# Patient Record
Sex: Female | Born: 1970
Health system: Southern US, Community
[De-identification: ages and names within clinical notes are randomized; demographics above are authoritative.]

## PROBLEM LIST (undated history)

## (undated) DIAGNOSIS — K259 Gastric ulcer, unspecified as acute or chronic, without hemorrhage or perforation: Secondary | ICD-10-CM

## (undated) DIAGNOSIS — D509 Iron deficiency anemia, unspecified: Secondary | ICD-10-CM

## (undated) DIAGNOSIS — D6851 Activated protein C resistance: Secondary | ICD-10-CM

## (undated) DIAGNOSIS — T8859XA Other complications of anesthesia, initial encounter: Secondary | ICD-10-CM

## (undated) DIAGNOSIS — M1990 Unspecified osteoarthritis, unspecified site: Secondary | ICD-10-CM

## (undated) DIAGNOSIS — L509 Urticaria, unspecified: Secondary | ICD-10-CM

## (undated) DIAGNOSIS — R519 Headache, unspecified: Secondary | ICD-10-CM

## (undated) DIAGNOSIS — G43909 Migraine, unspecified, not intractable, without status migrainosus: Secondary | ICD-10-CM

## (undated) DIAGNOSIS — E039 Hypothyroidism, unspecified: Secondary | ICD-10-CM

## (undated) DIAGNOSIS — I82409 Acute embolism and thrombosis of unspecified deep veins of unspecified lower extremity: Secondary | ICD-10-CM

## (undated) DIAGNOSIS — R51 Headache: Secondary | ICD-10-CM

## (undated) DIAGNOSIS — T4145XA Adverse effect of unspecified anesthetic, initial encounter: Secondary | ICD-10-CM

## (undated) HISTORY — PX: DILATION AND CURETTAGE OF UTERUS: SHX78

## (undated) HISTORY — DX: Hypothyroidism, unspecified: E03.9

## (undated) HISTORY — DX: Urticaria, unspecified: L50.9

## (undated) HISTORY — PX: TOOTH EXTRACTION: SHX859

## (undated) HISTORY — PX: GASTRIC BYPASS: SHX52

## (undated) HISTORY — PX: ANKLE FRACTURE SURGERY: SHX122

## (undated) HISTORY — DX: Migraine, unspecified, not intractable, without status migrainosus: G43.909

## (undated) HISTORY — PX: CHOLECYSTECTOMY: SHX55

## (undated) HISTORY — DX: Gastric ulcer, unspecified as acute or chronic, without hemorrhage or perforation: K25.9

## (undated) HISTORY — DX: Acute embolism and thrombosis of unspecified deep veins of unspecified lower extremity: I82.409

## (undated) HISTORY — PX: TUBAL LIGATION: SHX77

## (undated) HISTORY — PX: KNEE ARTHROSCOPY: SUR90

---

## 2014-10-01 ENCOUNTER — Encounter: Payer: Self-pay | Admitting: Family Medicine

## 2014-10-01 ENCOUNTER — Ambulatory Visit (INDEPENDENT_AMBULATORY_CARE_PROVIDER_SITE_OTHER): Payer: 59 | Admitting: Family Medicine

## 2014-10-01 ENCOUNTER — Encounter (INDEPENDENT_AMBULATORY_CARE_PROVIDER_SITE_OTHER): Payer: Self-pay

## 2014-10-01 VITALS — BP 112/74 | HR 84 | Temp 98.6°F | Ht 65.63 in | Wt 231.4 lb

## 2014-10-01 DIAGNOSIS — Z9884 Bariatric surgery status: Secondary | ICD-10-CM

## 2014-10-01 DIAGNOSIS — R252 Cramp and spasm: Secondary | ICD-10-CM | POA: Diagnosis not present

## 2014-10-01 DIAGNOSIS — M25562 Pain in left knee: Secondary | ICD-10-CM | POA: Diagnosis not present

## 2014-10-01 DIAGNOSIS — E039 Hypothyroidism, unspecified: Secondary | ICD-10-CM | POA: Diagnosis not present

## 2014-10-01 DIAGNOSIS — J309 Allergic rhinitis, unspecified: Secondary | ICD-10-CM

## 2014-10-01 LAB — COMPREHENSIVE METABOLIC PANEL
ALK PHOS: 84 U/L (ref 39–117)
ALT: 10 U/L (ref 0–35)
AST: 13 U/L (ref 0–37)
Albumin: 4 g/dL (ref 3.5–5.2)
BILIRUBIN TOTAL: 0.3 mg/dL (ref 0.2–1.2)
BUN: 21 mg/dL (ref 6–23)
CO2: 24 meq/L (ref 19–32)
CREATININE: 0.76 mg/dL (ref 0.40–1.20)
Calcium: 9.1 mg/dL (ref 8.4–10.5)
Chloride: 106 mEq/L (ref 96–112)
GFR: 86.88 mL/min (ref >60.00)
GLUCOSE: 76 mg/dL (ref 70–99)
Potassium: 4.1 mEq/L (ref 3.5–5.1)
Sodium: 137 mEq/L (ref 135–145)
TOTAL PROTEIN: 7.1 g/dL (ref 6.0–8.3)

## 2014-10-01 LAB — CBC
HCT: 30.4 % — ABNORMAL LOW (ref 36.0–46.0)
HEMOGLOBIN: 9.7 g/dL — AB (ref 12.0–15.0)
MCHC: 31.9 g/dL (ref 30.0–36.0)
MCV: 81.8 fl (ref 78.0–100.0)
PLATELETS: 476 10*3/uL — AB (ref 150.0–400.0)
RBC: 3.72 Mil/uL — ABNORMAL LOW (ref 3.87–5.11)
RDW: 16.4 % — AB (ref 11.5–15.5)
WBC: 9.4 10*3/uL (ref 4.0–10.5)

## 2014-10-01 LAB — TSH: TSH: 4.74 u[IU]/mL — AB (ref 0.35–4.50)

## 2014-10-01 LAB — T4, FREE: Free T4: 0.62 ng/dL (ref 0.60–1.60)

## 2014-10-01 MED ORDER — OMEPRAZOLE 40 MG PO CPDR: 40.0000 mg | DELAYED_RELEASE_CAPSULE | Freq: Every day | ORAL | Status: DC

## 2014-10-01 MED ORDER — FLUTICASONE PROPIONATE 50 MCG/ACT NA SUSP: 2.0000 | Freq: Every day | NASAL | Status: DC

## 2014-10-01 MED ORDER — VITAMIN D (ERGOCALCIFEROL) 1.25 MG (50000 UNIT) PO CAPS: 50000.0000 [IU] | ORAL_CAPSULE | ORAL | Status: DC

## 2014-10-01 NOTE — Assessment & Plan Note (Signed)
Normal exam today. Followed by ortho. Is to have partial knee replacement and needs surgical clearance. She is to obtain the surgical clearance form and request records from her prior physician and then follow-up in the office for surgical clearance office visit.

## 2014-10-01 NOTE — Assessment & Plan Note (Signed)
Has had these for a long time. No abnormalities on exam. Suspect these are related to dehydration and possible prior abnormal gait relating to pain in left knee. Will check electrolytes and TSH and CBC. Stay well hydrated. Given return precautions.

## 2014-10-01 NOTE — Progress Notes (Signed)
Pre visit review using our clinic review tool, if applicable. No additional management support is needed unless otherwise documented below in the visit note. 

## 2014-10-01 NOTE — Patient Instructions (Signed)
Nice to meet you. Please start on the flonase for your allergies. We will refer you to surgery for follow-up on your gastric bypass.  We will check labs today. Please obtain the surgical release form and schedule an appointment for this.  If you have abdominal pain, nausea, vomiting, diarrhea, numbness, weakness, fever, or feel poorly please seek medical attention.

## 2014-10-01 NOTE — Assessment & Plan Note (Signed)
Suspect sinus pressure related to this. Is neurologically intact. No neuro symptoms with this. No issues at this time. No symptoms to indicate acute infection. Will start on flonase. Given return precautions.

## 2014-10-01 NOTE — Assessment & Plan Note (Signed)
On armour thyroid. Will check TSH and adjust dose as necessary.

## 2014-10-01 NOTE — Progress Notes (Signed)
Patient ID: Whitney Hill, female   DOB: August 10, 1970, 44 y.o.   MRN: [id number]  Tommi Rumps, MD Phone: [phone]  Whitney Hill is a 44 y.o. female who presents today for new patient visit.  HYPOTHYROIDISM Disease Monitoring Weight changes: mild increase in weight  Skin Changes: dry skin is stable Has history of hair falling out. Previously followed by endocrinology for this issue  Medication Monitoring Compliance:  Taking armour thyroid daily.   Last TSH:  No results found for: TSH   Gastric bypass: notes she had this in 2012. Did well initially, though developed an ulcer in 2013 and had revision. Has had 4-5 EGDs to evaluate this in the past. Last was 2015. No abdominal pain or blood in stool. Notes a sensation of hunger when her ulcer is acting up. No reflux. Takes prilosec daily and this helps. Last had lab work at the end of 2015. Notes weight is stable.  Left knee pain: notes long history of pain in left knee and feet. Has history of meniscal tear and multiple surgeries on her left knee. Microfracture surgery last year. Is due for partial knee replacement. Has had cramps in her feet and legs intermittently for a long time. Has history of hypokalemia. Has plantar fascitis followed by podiatry. History of ankle fracture in right ankle when younger. Has had injects in ankles and for plantar fascia recently per podiatry. Notes no swelling, fever, erythema, or chills.   Sinus pressure: patient notes intermittent frontal and maxillary sinus pressure since moving to [place]. Notes that she has history of allergic rhinitis, though now has no eye symptoms, rhinorrhea, or post nasal drip. No weakness, numbness, or vision changes with this pressure. Notes that this pressure has improved recently. No medications for this.    Active Ambulatory Problems    Diagnosis Date Noted  . Hypothyroidism 10/01/2014  . Left knee pain 10/01/2014  . Cramps, extremity 10/01/2014  . Allergic rhinitis  10/01/2014  . S/P gastric bypass 10/01/2014   Resolved Ambulatory Problems    Diagnosis Date Noted  . No Resolved Ambulatory Problems   Past Medical History  Diagnosis Date  . Gastric ulcer   . DVT (deep venous thrombosis)   . Hypothyroid     Family History  Problem Relation Age of Onset  . Arthritis      parent  . Breast cancer      aunt, grandmother  . Hyperlipidemia      parent  . Stroke      parent  . Hypertension      parent  . Kidney disease      parent, grandparent  . Diabetes      parent, grandparent  . Renal cancer      parent    Social History   Social History  . Marital Status: Married    Spouse Name: N/A  . Number of Children: N/A  . Years of Education: N/A   Occupational History  . Not on file.   Social History Main Topics  . Smoking status: Never Smoker   . Smokeless tobacco: Not on file  . Alcohol Use: No  . Drug Use: No  . Sexual Activity: Not on file   Other Topics Concern  . Not on file   Social History Narrative  . No narrative on file    ROS    General:  Negative for nexplained weight loss, fever Skin: Negative for new or changing mole, sore that won't heal HEENT: positive for sinus pressure,  Negative for trouble hearing, trouble seeing, ringing in ears, mouth sores, hoarseness, change in voice, dysphagia. CV:  Negative for chest pain, dyspnea, edema, palpitations Resp: Negative for cough, dyspnea, hemoptysis GI: Negative for nausea, vomiting, diarrhea, constipation, abdominal pain, melena, hematochezia. GU: Negative for dysuria, incontinence, urinary hesitance, hematuria, vaginal or penile discharge, polyuria, sexual difficulty, lumps in testicle or breasts MSK: positive for joint pain and muscle cramps Neuro: Negative for headaches, weakness, numbness, dizziness, passing out/fainting Psych: Negative for depression, anxiety, memory problems  Objective  Physical Exam Filed Vitals:   10/01/14 1033  BP: 112/74  Pulse: 84   Temp: 98.6 F (37 C)    BP Readings from Last 3 Encounters:  10/01/14 112/74   Wt Readings from Last 3 Encounters:  10/01/14 231 lb 6.4 oz (104.962 kg)    Physical Exam  Constitutional: She is well-developed, well-nourished, and in no distress.  HENT:  Head: Normocephalic and atraumatic.  Right Ear: External ear normal.  Left Ear: External ear normal.  Mouth/Throat: Oropharynx is clear and moist. No oropharyngeal exudate.  Eyes: Conjunctivae are normal. Pupils are equal, round, and reactive to light.  Neck: Neck supple.  Cardiovascular: Normal rate, regular rhythm and normal heart sounds.  Exam reveals no gallop and no friction rub.   No murmur heard. Pulmonary/Chest: Effort normal and breath sounds normal. No respiratory distress. She has no wheezes. She has no rales.  Abdominal: Soft. Bowel sounds are normal. She exhibits no distension. There is no tenderness. There is no rebound and no guarding.  Musculoskeletal: She exhibits no edema.  Bilateral knees with no swelling, erythema, tenderness, or ligamentous laxity, negative mcmurray bilaterally Bilateral feet and ankles with no swelling, erythema, or tenderness LE WWP 2+ DP pulses bilaterally No calf spasm or tenderness, no cords in calves  Lymphadenopathy:    She has no cervical adenopathy.  Neurological: She is alert. Gait normal.  CN 2-12 intact, 5/5 strength in bilateral biceps, triceps, grip, quads, hamstrings, plantar and dorsiflexion, sensation to light touch intact in bilateral UE and LE, normal gait, 2+ patellar reflexes  Skin: Skin is warm and dry. She is not diaphoretic.  Psychiatric: Mood and affect normal.     Assessment/Plan:   Hypothyroidism On armour thyroid. Will check TSH and adjust dose as necessary.   Left knee pain Normal exam today. Followed by ortho. Is to have partial knee replacement and needs surgical clearance. She is to obtain the surgical clearance form and request records from her prior  physician and then follow-up in the office for surgical clearance office visit.   Cramps, extremity Has had these for a long time. No abnormalities on exam. Suspect these are related to dehydration and possible prior abnormal gait relating to pain in left knee. Will check electrolytes and TSH and CBC. Stay well hydrated. Given return precautions.   Allergic rhinitis Suspect sinus pressure related to this. Is neurologically intact. No neuro symptoms with this. No issues at this time. No symptoms to indicate acute infection. Will start on flonase. Given return precautions.   S/P gastric bypass Patient s/p gastric bypass several years ago. Has had revision due to ulcer formation. No abdominal pain at this time. Followed by surgery and GI for this in [place]. Will continue PPI at this time. Will refer to [facility] in [place] to follow up on this. Given return precautions.     Orders Placed This Encounter  Procedures  . TSH  . CBC  . Comp Met (CMET)  .  T3  . T4, free    Meds ordered this encounter  Medications  . DISCONTD: omeprazole (PRILOSEC) 40 MG capsule    Sig: Take 40 mg by mouth daily.  Marland Kitchen thyroid (ARMOUR) 90 MG tablet    Sig: Take 90 mg by mouth daily.  Marland Kitchen DISCONTD: Vitamin D, Ergocalciferol, (DRISDOL) 50000 UNITS CAPS capsule    Sig: Take 50,000 Units by mouth every 7 (seven) days.  . fluticasone (FLONASE) 50 MCG/ACT nasal spray    Sig: Place 2 sprays into both nostrils daily.    Dispense:  16 g    Refill:  6  . omeprazole (PRILOSEC) 40 MG capsule    Sig: Take 1 capsule (40 mg total) by mouth daily.    Dispense:  90 capsule    Refill:  3  . Vitamin D, Ergocalciferol, (DRISDOL) 50000 UNITS CAPS capsule    Sig: Take 1 capsule (50,000 Units total) by mouth every 7 (seven) days.    Dispense:  30 capsule    Refill:  0    Tommi Rumps

## 2014-10-01 NOTE — Assessment & Plan Note (Signed)
Patient s/p gastric bypass several years ago. Has had revision due to ulcer formation. No abdominal pain at this time. Followed by surgery and GI for this in [place]. Will continue PPI at this time. Will refer to [facility] in [place] to follow up on this. Given return precautions.

## 2014-10-02 ENCOUNTER — Telehealth: Payer: Self-pay | Admitting: Family Medicine

## 2014-10-02 LAB — T3: T3, Total: 78.8 ng/dL — ABNORMAL LOW (ref 80.0–204.0)

## 2014-10-02 NOTE — Telephone Encounter (Signed)
Called patient to discuss lab work. Thyroid tests near normal. CMET normal. Is anemic. Patient declines any history of anemia. Notes she was previously on iron supplements with her gastric bypass, though had constipation issues with this in the past and stopped iron. Notes her surgeon was ok with this since her hgb had been normal. She will start on iron supplement. Will pick up FOBT cards on Monday to evaluate for GI bleed given ulcer history. Denies bleeding from any site. Given return precautions.

## 2014-10-05 NOTE — Telephone Encounter (Signed)
Put FOBT cards at the front for her to pick up.

## 2014-11-05 ENCOUNTER — Telehealth: Payer: Self-pay | Admitting: Surgical

## 2014-11-05 NOTE — Telephone Encounter (Signed)
Patient called back and has decided not to have the knee surgery until January 2017

## 2014-11-05 NOTE — Telephone Encounter (Signed)
Called patient to schedule appointment for surgery clearance. LM for patient to call back.

## 2015-01-14 ENCOUNTER — Encounter: Payer: Self-pay | Admitting: Family Medicine

## 2015-01-14 ENCOUNTER — Ambulatory Visit (INDEPENDENT_AMBULATORY_CARE_PROVIDER_SITE_OTHER): Payer: 59 | Admitting: Family Medicine

## 2015-01-14 VITALS — BP 108/64 | HR 69 | Temp 98.3°F | Ht 65.63 in | Wt 242.2 lb

## 2015-01-14 DIAGNOSIS — D509 Iron deficiency anemia, unspecified: Secondary | ICD-10-CM | POA: Insufficient documentation

## 2015-01-14 DIAGNOSIS — Z419 Encounter for procedure for purposes other than remedying health state, unspecified: Secondary | ICD-10-CM | POA: Insufficient documentation

## 2015-01-14 DIAGNOSIS — D649 Anemia, unspecified: Secondary | ICD-10-CM

## 2015-01-14 DIAGNOSIS — E039 Hypothyroidism, unspecified: Secondary | ICD-10-CM

## 2015-01-14 LAB — CBC WITH DIFFERENTIAL/PLATELET
BASOS ABS: 0 10*3/uL (ref 0.0–0.1)
BASOS PCT: 0.4 % (ref 0.0–3.0)
EOS ABS: 0.2 10*3/uL (ref 0.0–0.7)
Eosinophils Relative: 3.4 % (ref 0.0–5.0)
HEMATOCRIT: 30.6 % — AB (ref 36.0–46.0)
Hemoglobin: 9.6 g/dL — ABNORMAL LOW (ref 12.0–15.0)
LYMPHS ABS: 2.6 10*3/uL (ref 0.7–4.0)
Lymphocytes Relative: 38.1 % (ref 12.0–46.0)
MCHC: 31.2 g/dL (ref 30.0–36.0)
MCV: 80.4 fl (ref 78.0–100.0)
Monocytes Absolute: 0.5 10*3/uL (ref 0.1–1.0)
Monocytes Relative: 7.3 % (ref 3.0–12.0)
NEUTROS ABS: 3.5 10*3/uL (ref 1.4–7.7)
NEUTROS PCT: 50.8 % (ref 43.0–77.0)
PLATELETS: 448 10*3/uL — AB (ref 150.0–400.0)
RBC: 3.81 Mil/uL — ABNORMAL LOW (ref 3.87–5.11)
RDW: 16.9 % — AB (ref 11.5–15.5)
WBC: 6.9 10*3/uL (ref 4.0–10.5)

## 2015-01-14 LAB — TSH: TSH: 6.38 u[IU]/mL — ABNORMAL HIGH (ref 0.35–4.50)

## 2015-01-14 LAB — FOLATE: FOLATE: 6.7 ng/mL (ref >5.9)

## 2015-01-14 LAB — T3, FREE: T3, Free: 3.7 pg/mL (ref 2.3–4.2)

## 2015-01-14 LAB — FERRITIN: Ferritin: 3.3 ng/mL — ABNORMAL LOW (ref 10.0–291.0)

## 2015-01-14 LAB — VITAMIN B12: VITAMIN B 12: 310 pg/mL (ref 211–911)

## 2015-01-14 NOTE — Assessment & Plan Note (Signed)
We'll check TSH and T3, T4.

## 2015-01-14 NOTE — Assessment & Plan Note (Signed)
Patient presented today for evaluation for surgical clearance. I will not state that she can have surgery at this time given that she has previously been anemic. We will complete evaluation of this and treat as necessary prior to stating she can have surgery. She is a low risk patient using the gupta perioperative cardiac risk score, 0.11%. She'll need evaluation of and treatment for her anemia prior to obtaining surgery. She additionally reports a history of DVT and she will need anticoagulation prior to surgery and this will be deferred to her surgeon.

## 2015-01-14 NOTE — Patient Instructions (Signed)
Nice to see you. We will recheck lab work for anemia and her thyroid. We will need these results prior to determining if you can proceed with her surgery.

## 2015-01-14 NOTE — Progress Notes (Signed)
Patient ID: Whitney Hill, female   DOB: 1970-03-01, 45 y.o.   MRN: [id number]  Tommi Rumps, MD Phone: [phone]  Whitney Hill is a 45 y.o. female who presents today for follow-up.  Patient presents to discuss surgical clearance for a left knee replacement. She is having this done by orthopedics in [place]. The surgery is not scheduled yet. Patient has a history of anemia. Most recent hemoglobin was 9.7. She denies any blood loss. She was supposed to complete stool cards, though never picked them up. She denies blood in her stool and excessive bleeding with periods. No bleeding in her urine. She denies chest pain, shortness of breath, palpitations, and fatigue. She did have a gastric bypass and had been on iron supplement though stopped this on her own previously. She restarted iron about 3 months ago. She's taking it twice a day. She does note a history of a gastric ulcer that she notes symptoms include feeling hungry, though no abdominal pain or indigestion. Has been taking omeprazole for this. No blood in her stool. Patient additionally has hypothyroidism and is on Armour Thyroid. Patient does not get chest pain or breathlessness when she climbs 2 flights of stairs at a normal speed. She does not have kidney disease. No family history of issues with anesthesia. No history of MI. No history of irregular heartbeat. She's never had a stroke. She's never had an issue with anesthesia before. No issues of seizure or epilepsy. No pain, stiffness, or arthritis of her neck or jaw. Denies angina. No history of liver disease. No history of heart disease. No history of asthma. Does not have diabetes. Does not have bronchitis. Does not smoke. Denies OSA symptoms. Does note a history of DVT after her first knee surgery and had been on Coumadin after that. For about 3 months.  PMH: nonsmoker.   ROS see history of present illness  Objective  Physical Exam Filed Vitals:   01/14/15  1104  BP: 108/64  Pulse: 69  Temp: 98.3 F (36.8 C)    Physical Exam  Constitutional: She is well-developed, well-nourished, and in no distress.  HENT:  Head: Normocephalic and atraumatic.  Mouth/Throat: Oropharynx is clear and moist. No oropharyngeal exudate.  Eyes: Conjunctivae are normal. Pupils are equal, round, and reactive to light.  Neck: Neck supple.  Cardiovascular: Normal rate, regular rhythm and normal heart sounds.  Exam reveals no gallop and no friction rub.   No murmur heard. Pulmonary/Chest: Effort normal and breath sounds normal. No respiratory distress. She has no wheezes. She has no rales.  Abdominal: Soft. Bowel sounds are normal. She exhibits no distension. There is no tenderness. There is no rebound and no guarding.  Lymphadenopathy:    She has no cervical adenopathy.  Neurological: She is alert. Gait normal.  Skin: Skin is warm and dry. She is not diaphoretic.    Assessment/Plan: Please see individual problem list.  Anemia Suspect this is likely related to nutritional deficiency given her gastric bypass history. She is asymptomatic. She does have a history of gastric ulcer, though reports no bleeding in her stool. She'll be given FOBT cards. We will check a CBC and anemia panel today. She is given return precautions.  Hypothyroidism We'll check TSH and T3, T4.  Surgery, elective, encounter for clearance Patient presented today for evaluation for surgical clearance. I will not state that she can have surgery at this time given that she has previously been anemic. We will complete evaluation of this and treat as necessary prior  to stating she can have surgery. She is a low risk patient using the gupta perioperative cardiac risk score, 0.11%. She'll need evaluation of and treatment for her anemia prior to obtaining surgery. She additionally reports a history of DVT and she will need anticoagulation prior to surgery and this will be deferred to her  surgeon.    Orders Placed This Encounter  Procedures  . CBC w/Diff  . Iron and TIBC  . Ferritin  . B12  . Folate  . TSH  . T3, free  . T4     Dragon voice recognition software was used during the dictation process of this note. If any phrases or words seem inappropriate it is likely secondary to the translation process being inefficient.  Tommi Rumps

## 2015-01-14 NOTE — Progress Notes (Signed)
Pre visit review using our clinic review tool, if applicable. No additional management support is needed unless otherwise documented below in the visit note. 

## 2015-01-14 NOTE — Assessment & Plan Note (Signed)
Suspect this is likely related to nutritional deficiency given her gastric bypass history. She is asymptomatic. She does have a history of gastric ulcer, though reports no bleeding in her stool. She'll be given FOBT cards. We will check a CBC and anemia panel today. She is given return precautions.

## 2015-01-15 LAB — IRON AND TIBC
%SAT: 5 % — ABNORMAL LOW (ref 11–50)
IRON: 24 ug/dL — AB (ref 40–190)
TIBC: 502 ug/dL — AB (ref 250–450)
UIBC: 478 ug/dL — AB (ref 125–400)

## 2015-01-15 LAB — T4: T4, Total: 6.3 ug/dL (ref 4.5–12.0)

## 2015-01-19 ENCOUNTER — Telehealth: Payer: Self-pay | Admitting: *Deleted

## 2015-01-19 DIAGNOSIS — D509 Iron deficiency anemia, unspecified: Secondary | ICD-10-CM

## 2015-01-19 NOTE — Telephone Encounter (Signed)
Patient was seen by Dr.Sonnenberg on Thursday for a medical clearance for surgery . She questioned if she would be cleared for her knee surgery. Please advise Contact [phone]

## 2015-01-19 NOTE — Telephone Encounter (Signed)
I sent her a Pharmacist, community message. I can not clear her for surgery. Her hemoglobin was unchanged from previous. This needs to improve prior to surgery. She needs to complete the stool cards. I also need to determine the best way for her to have her iron repleted. She also needs to get back in to see a bariatric surgeon for follow up of her gastric bypass as this is likely contributing to her iron deficiency.

## 2015-01-19 NOTE — Telephone Encounter (Signed)
Please advise 

## 2015-01-19 NOTE — Telephone Encounter (Signed)
Spoke with patient and advised that Dr. Caryl Bis will not be able to clear for surgery at this point. She stated that she will bring in stool cards tomorrow. Also told her that she will need to follow up with Bariatric surgeon from gastric bypass due to this is likely contributing to the iron deficiency. Patient wants to know if she needs iron infusion. I told her that we will call her back once you have decided next step.

## 2015-01-19 NOTE — Telephone Encounter (Signed)
Left message for patient to return call.

## 2015-01-21 NOTE — Telephone Encounter (Signed)
The most efficient way to get the patient an iron infusion and evaluation of her anemia would be to have her see hematology. I will place referral for this.

## 2015-01-22 NOTE — Telephone Encounter (Signed)
Spoke with patient and gave message from Dr. Caryl Bis. I told patient that referral coordinator will call with appointment for Hematology.

## 2015-01-22 NOTE — Telephone Encounter (Signed)
Do you want patient to call for appointment or would you like to make appointment.

## 2015-01-22 NOTE — Telephone Encounter (Signed)
Left message for patient to return call.

## 2015-01-26 ENCOUNTER — Other Ambulatory Visit: Payer: 59

## 2015-01-29 ENCOUNTER — Inpatient Hospital Stay: Payer: 59 | Attending: Internal Medicine | Admitting: Internal Medicine

## 2015-01-29 ENCOUNTER — Other Ambulatory Visit (INDEPENDENT_AMBULATORY_CARE_PROVIDER_SITE_OTHER): Payer: 59

## 2015-01-29 ENCOUNTER — Encounter: Payer: Self-pay | Admitting: Internal Medicine

## 2015-01-29 ENCOUNTER — Other Ambulatory Visit: Payer: Self-pay | Admitting: *Deleted

## 2015-01-29 ENCOUNTER — Inpatient Hospital Stay: Payer: 59

## 2015-01-29 VITALS — BP 119/80 | HR 80 | Temp 97.3°F | Resp 18 | Ht 65.0 in | Wt 242.3 lb

## 2015-01-29 DIAGNOSIS — Z23 Encounter for immunization: Secondary | ICD-10-CM | POA: Insufficient documentation

## 2015-01-29 DIAGNOSIS — Z79899 Other long term (current) drug therapy: Secondary | ICD-10-CM | POA: Diagnosis not present

## 2015-01-29 DIAGNOSIS — D509 Iron deficiency anemia, unspecified: Secondary | ICD-10-CM | POA: Diagnosis present

## 2015-01-29 DIAGNOSIS — D473 Essential (hemorrhagic) thrombocythemia: Secondary | ICD-10-CM | POA: Insufficient documentation

## 2015-01-29 DIAGNOSIS — Z1211 Encounter for screening for malignant neoplasm of colon: Secondary | ICD-10-CM | POA: Diagnosis not present

## 2015-01-29 DIAGNOSIS — E039 Hypothyroidism, unspecified: Secondary | ICD-10-CM | POA: Insufficient documentation

## 2015-01-29 DIAGNOSIS — Z9884 Bariatric surgery status: Secondary | ICD-10-CM | POA: Insufficient documentation

## 2015-01-29 DIAGNOSIS — Z86718 Personal history of other venous thrombosis and embolism: Secondary | ICD-10-CM | POA: Diagnosis not present

## 2015-01-29 LAB — FECAL OCCULT BLOOD, IMMUNOCHEMICAL: Fecal Occult Bld: NEGATIVE

## 2015-01-29 MED ORDER — SODIUM CHLORIDE 0.9 % IJ SOLN
10.0000 mL | INTRAMUSCULAR | Status: DC | PRN
  Filled 2015-01-29: qty 10

## 2015-01-29 MED ORDER — SODIUM CHLORIDE 0.9 % IV SOLN
INTRAVENOUS | Status: DC
  Administered 2015-01-29: 10:00:00 via INTRAVENOUS
  Filled 2015-01-29: qty 1000

## 2015-01-29 MED ORDER — INFLUENZA VAC SPLIT QUAD 0.5 ML IM SUSY
0.5000 mL | PREFILLED_SYRINGE | Freq: Once | INTRAMUSCULAR | Status: AC
  Administered 2015-01-29: 0.5 mL via INTRAMUSCULAR

## 2015-01-29 MED ORDER — SODIUM CHLORIDE 0.9 % IV SOLN
510.0000 mg | Freq: Once | INTRAVENOUS | Status: AC
  Administered 2015-01-29: 510 mg via INTRAVENOUS
  Filled 2015-01-29: qty 17

## 2015-01-29 NOTE — Progress Notes (Signed)
[place] @ [facility] [phone]  [phone]     Whitney Hill OB: 29-Oct-1970  MR#: [id number]  AOZ#:[id number]  Patient Care Team: Leone Haven, MD as PCP - General (Family Medicine)  CHIEF COMPLAINT:  Chief Complaint  Patient presents with  . IDA     No history exists.    No flowsheet data found.  HISTORY OF PRESENT ILLNESS:   Whitney Hill is a 45 year old Caucasian female with history of morbid obesity, status post gastric bypass surgery in 2012, who is referred to our clinic for evaluation of iron deficiency anemia. Patient claims that until she moved to [place] one year ago, although her hematologic parameters were normal even after gastric bypass. She, however, was found to have anemia and iron deficiency, and was advised to take oral iron supplementation, which she didn't, since oral iron causes significant constipation. She still has menstrual bleedings, which recently have been irregular, lasting up to 4 days. She denies any bleeding from any other source. She also does not have any shortness of breath, chest pain, dizziness, fatigue, feeling cold, nausea, vomiting, diarrhea, constipation. She maintains regular diet.  REVIEW OF SYSTEMS:   Review of Systems  All other systems reviewed and are negative.    PAST MEDICAL HISTORY: Past Medical History  Diagnosis Date  . Gastric ulcer   . DVT (deep venous thrombosis) ([place])     following first knee surgery in 2011  . Hypothyroid     PAST SURGICAL HISTORY: Past Surgical History  Procedure Laterality Date  . Gastric bypass      2012, revision 2013  . Cholecystectomy      1998  . Ankle fracture surgery      1994, 1996  . Dilation and curettage of uterus      1994, 2001  . Tubal ligation      2004  . Tooth extraction      2010  . Knee arthroscopy      2011, 2013, 2015    FAMILY HISTORY Family History  Problem Relation Age of Onset  . Arthritis      parent  . Breast cancer       aunt, grandmother  . Hyperlipidemia      parent  . Stroke      parent  . Hypertension      parent  . Kidney disease      parent, grandparent  . Diabetes      parent, grandparent  . Renal cancer      parent   Patient was tested for BRCA mutation, and was found to be negative.  ADVANCED DIRECTIVES:  No flowsheet data found.  HEALTH MAINTENANCE: Social History  Substance Use Topics  . Smoking status: Never Smoker   . Smokeless tobacco: Not on file  . Alcohol Use: No     Allergies  Allergen Reactions  . Paxil [Paroxetine Hcl] Anaphylaxis    Throat swelled shut  . Penicillins Hives    Current Outpatient Prescriptions  Medication Sig Dispense Refill  . fluticasone (FLONASE) 50 MCG/ACT nasal spray Place 2 sprays into both nostrils daily. 16 g 6  . omeprazole (PRILOSEC) 40 MG capsule Take 1 capsule (40 mg total) by mouth daily. 90 capsule 3  . thyroid (ARMOUR) 90 MG tablet Take 90 mg by mouth daily.    . Vitamin D, Ergocalciferol, (DRISDOL) 50000 UNITS CAPS capsule Take 1 capsule (50,000 Units total) by mouth every 7 (seven) days. 30 capsule 0   No  current facility-administered medications for this visit.    OBJECTIVE:  Filed Vitals:   01/29/15 0905  BP: 76/50  Pulse: 80  Temp: 97.3 F (36.3 C)  Resp: 18     Body mass index is 40.32 kg/(m^2).    ECOG FS:0 - Asymptomatic  Physical Exam  Constitutional: She is oriented to person, place, and time and well-developed, well-nourished, and in no distress. No distress.  Morbidly obese Caucasian female. Body mass index is 40.32 kg/(m^2).   HENT:  Head: Normocephalic and atraumatic.  Right Ear: External ear normal.  Left Ear: External ear normal.  Mouth/Throat: Oropharynx is clear and moist.  Eyes: Conjunctivae are normal. Pupils are equal, round, and reactive to light. Right eye exhibits no discharge. Left eye exhibits no discharge. No scleral icterus.  Neck: Normal range of motion. Neck supple. No JVD present.  No tracheal deviation present. No thyromegaly present.  Cardiovascular: Normal rate, regular rhythm, normal heart sounds and intact distal pulses.  Exam reveals no gallop and no friction rub.   No murmur heard. Pulmonary/Chest: Effort normal and breath sounds normal. No stridor. No respiratory distress. She has no wheezes. She has no rales. She exhibits no tenderness.  Abdominal: Soft. Bowel sounds are normal. She exhibits no distension and no mass. There is no tenderness. There is no rebound and no guarding.  Genitourinary:  Postponed  Musculoskeletal: Normal range of motion. She exhibits no edema or tenderness.  Lymphadenopathy:    She has no cervical adenopathy.  Neurological: She is alert and oriented to person, place, and time. She has normal reflexes. No cranial nerve deficit. She exhibits normal muscle tone. Gait normal. Coordination normal. GCS score is 15.  Skin: Skin is warm. No rash noted. She is not diaphoretic. No erythema. No pallor.  Psychiatric: Mood, memory, affect and judgment normal.  Nursing note and vitals reviewed.    LAB RESULTS:  CBC Latest Ref Rng 01/14/2015 10/01/2014  WBC 4.0 - 10.5 K/uL 6.9 9.4  Hemoglobin 12.0 - 15.0 g/dL 9.6(L) 9.7(L)  Hematocrit 36.0 - 46.0 % 30.6(L) 30.4(L)  Platelets 150.0 - 400.0 K/uL 448.0(H) 476.0(H)    No visits with results within 5 Day(s) from this visit. Latest known visit with results is:  Office Visit on 01/14/2015  Component Date Value Ref Range Status  . WBC 01/14/2015 6.9  4.0 - 10.5 K/uL Final  . RBC 01/14/2015 3.81* 3.87 - 5.11 Mil/uL Final  . Hemoglobin 01/14/2015 9.6* 12.0 - 15.0 g/dL Final  . HCT 01/14/2015 30.6* 36.0 - 46.0 % Final  . MCV 01/14/2015 80.4  78.0 - 100.0 fl Final  . MCHC 01/14/2015 31.2  30.0 - 36.0 g/dL Final  . RDW 01/14/2015 16.9* 11.5 - 15.5 % Final  . Platelets 01/14/2015 448.0* 150.0 - 400.0 K/uL Final  . Neutrophils Relative % 01/14/2015 50.8  43.0 - 77.0 % Final  . Lymphocytes Relative  01/14/2015 38.1  12.0 - 46.0 % Final  . Monocytes Relative 01/14/2015 7.3  3.0 - 12.0 % Final  . Eosinophils Relative 01/14/2015 3.4  0.0 - 5.0 % Final  . Basophils Relative 01/14/2015 0.4  0.0 - 3.0 % Final  . Neutro Abs 01/14/2015 3.5  1.4 - 7.7 K/uL Final  . Lymphs Abs 01/14/2015 2.6  0.7 - 4.0 K/uL Final  . Monocytes Absolute 01/14/2015 0.5  0.1 - 1.0 K/uL Final  . Eosinophils Absolute 01/14/2015 0.2  0.0 - 0.7 K/uL Final  . Basophils Absolute 01/14/2015 0.0  0.0 - 0.1 K/uL Final  .  Iron 01/14/2015 24* 40 - 190 ug/dL Final  . UIBC 01/14/2015 478* 125 - 400 ug/dL Final  . TIBC 01/14/2015 502* 250 - 450 ug/dL Final  . %SAT 01/14/2015 5* 11 - 50 % Final  . Ferritin 01/14/2015 3.3* 10.0 - 291.0 ng/mL Final  . Vitamin B-12 01/14/2015 310  211 - 911 pg/mL Final  . Folate 01/14/2015 6.7  >5.9 ng/mL Final  . TSH 01/14/2015 6.38* 0.35 - 4.50 uIU/mL Final  . T3, Free 01/14/2015 3.7  2.3 - 4.2 pg/mL Final  . T4, Total 01/14/2015 6.3  4.5 - 12.0 ug/dL Final     STUDIES: No results found.  ASSESSMENT and MEDICAL DECISION MAKING:  #Iron deficiency anemia-secondary to inability to absorb iron post gastric bypass surgery. There is no reason for the patient to continue with oral iron supplementation, we will administer intravenous Feraheme today and in one week. She will return to our clinic in 1 month later to have ferritin and CBC checked. The patient will require periodic Feraheme infusions, so we will need to establish the rate of filtration of iron stores by taking ferritin every 3 months for the next year.  The patient has history of gastric ulcer, so blood loss from the GI tract is a possibility. She had FOBT done at her primary care doctor's office last week, but she is not aware of the results. She will follow up on this with the primary care doctor. Vitamin B12 levels and folate levels appear to be reasonably good, but she continues to take oral supplementation, which may or may not be  effective in this situation either. So, it makes sense to check vitamin B12 levels in 6 months to decide on whether the patient would need parenteral injections of vitamin B12. #Thrombocytosis-white likely secondary to iron deficiency. We should monitor her platelet count after normalization of iron stores, and decide on whether the patient needs additional evaluation for essential thrombocythemia once iron stores are normalized. #Hypothyroidism-patient is suboptimally controlled, with TSH being above upper limits of normal. We will refer her to our endocrinology colleagues for additional interventions.  Patient expressed understanding and was in agreement with this plan. She also understands that She can call clinic at any time with any questions, concerns, or complaints.    No matching staging information was found for the patient.  Roxana Hires, MD   01/29/2015 9:04 AM

## 2015-02-05 ENCOUNTER — Inpatient Hospital Stay: Payer: 59

## 2015-02-05 VITALS — BP 105/72 | HR 80 | Resp 20

## 2015-02-05 DIAGNOSIS — D509 Iron deficiency anemia, unspecified: Secondary | ICD-10-CM | POA: Diagnosis not present

## 2015-02-05 MED ORDER — SODIUM CHLORIDE 0.9 % IV SOLN
INTRAVENOUS | Status: DC
  Administered 2015-02-05: 10:00:00 via INTRAVENOUS
  Filled 2015-02-05: qty 1000

## 2015-02-05 MED ORDER — FERUMOXYTOL INJECTION 510 MG/17 ML
510.0000 mg | Freq: Once | INTRAVENOUS | Status: AC
  Administered 2015-02-05: 510 mg via INTRAVENOUS
  Filled 2015-02-05: qty 17

## 2015-02-05 MED ORDER — SODIUM CHLORIDE 0.9 % IJ SOLN
10.0000 mL | INTRAMUSCULAR | Status: DC | PRN
  Filled 2015-02-05: qty 10

## 2015-03-03 ENCOUNTER — Inpatient Hospital Stay: Payer: 59 | Attending: Internal Medicine

## 2015-03-03 DIAGNOSIS — E039 Hypothyroidism, unspecified: Secondary | ICD-10-CM | POA: Diagnosis not present

## 2015-03-03 DIAGNOSIS — Z79899 Other long term (current) drug therapy: Secondary | ICD-10-CM | POA: Diagnosis not present

## 2015-03-03 DIAGNOSIS — Z8719 Personal history of other diseases of the digestive system: Secondary | ICD-10-CM | POA: Diagnosis not present

## 2015-03-03 DIAGNOSIS — Z9884 Bariatric surgery status: Secondary | ICD-10-CM | POA: Insufficient documentation

## 2015-03-03 DIAGNOSIS — Z86718 Personal history of other venous thrombosis and embolism: Secondary | ICD-10-CM | POA: Insufficient documentation

## 2015-03-03 DIAGNOSIS — D509 Iron deficiency anemia, unspecified: Secondary | ICD-10-CM | POA: Diagnosis not present

## 2015-03-03 DIAGNOSIS — D6851 Activated protein C resistance: Secondary | ICD-10-CM | POA: Diagnosis not present

## 2015-03-03 LAB — CBC WITH DIFFERENTIAL/PLATELET
BASOS ABS: 0 10*3/uL (ref 0–0.1)
Basophils Relative: 0 %
EOS PCT: 2 %
Eosinophils Absolute: 0.2 10*3/uL (ref 0–0.7)
HEMATOCRIT: 37.6 % (ref 35.0–47.0)
Hemoglobin: 12.5 g/dL (ref 12.0–16.0)
LYMPHS ABS: 2.2 10*3/uL (ref 1.0–3.6)
LYMPHS PCT: 27 %
MCH: 28.9 pg (ref 26.0–34.0)
MCHC: 33.1 g/dL (ref 32.0–36.0)
MCV: 87.2 fL (ref 80.0–100.0)
MONO ABS: 0.6 10*3/uL (ref 0.2–0.9)
Monocytes Relative: 8 %
NEUTROS ABS: 5.2 10*3/uL (ref 1.4–6.5)
Neutrophils Relative %: 63 %
PLATELETS: 296 10*3/uL (ref 150–440)
RBC: 4.32 MIL/uL (ref 3.80–5.20)
RDW: 24.4 % — ABNORMAL HIGH (ref 11.5–14.5)
WBC: 8.3 10*3/uL (ref 3.6–11.0)

## 2015-03-03 LAB — FERRITIN: Ferritin: 85 ng/mL (ref 11–307)

## 2015-03-04 ENCOUNTER — Other Ambulatory Visit: Payer: 59

## 2015-03-05 ENCOUNTER — Inpatient Hospital Stay: Payer: 59

## 2015-03-05 ENCOUNTER — Inpatient Hospital Stay (HOSPITAL_BASED_OUTPATIENT_CLINIC_OR_DEPARTMENT_OTHER): Payer: 59 | Admitting: Internal Medicine

## 2015-03-05 ENCOUNTER — Other Ambulatory Visit: Payer: Self-pay | Admitting: Internal Medicine

## 2015-03-05 VITALS — BP 107/74 | HR 66

## 2015-03-05 VITALS — BP 110/82 | HR 78 | Temp 97.9°F | Resp 18 | Wt 241.4 lb

## 2015-03-05 DIAGNOSIS — Z9884 Bariatric surgery status: Secondary | ICD-10-CM | POA: Diagnosis not present

## 2015-03-05 DIAGNOSIS — D509 Iron deficiency anemia, unspecified: Secondary | ICD-10-CM | POA: Diagnosis not present

## 2015-03-05 DIAGNOSIS — Z79899 Other long term (current) drug therapy: Secondary | ICD-10-CM

## 2015-03-05 DIAGNOSIS — I82402 Acute embolism and thrombosis of unspecified deep veins of left lower extremity: Secondary | ICD-10-CM

## 2015-03-05 DIAGNOSIS — D6851 Activated protein C resistance: Secondary | ICD-10-CM

## 2015-03-05 MED ORDER — SODIUM CHLORIDE 0.9 % IJ SOLN
10.0000 mL | INTRAMUSCULAR | Status: DC | PRN
  Administered 2015-03-05: 10 mL
  Filled 2015-03-05: qty 10

## 2015-03-05 MED ORDER — SODIUM CHLORIDE 0.9 % IV SOLN
510.0000 mg | Freq: Once | INTRAVENOUS | Status: AC
  Administered 2015-03-05: 510 mg via INTRAVENOUS
  Filled 2015-03-05: qty 17

## 2015-03-05 NOTE — Progress Notes (Signed)
Patient does not feel any improvement in fatigue after iron infusions.  She also would like to discuss possible further work up for Factor IV.  Since last visit here her mother has been tested positive for Factor IV and the patient does have history of DVT in 2011.

## 2015-03-05 NOTE — Progress Notes (Signed)
[facility] OFFICE PROGRESS NOTE  Patient Care Team: Leone Haven, MD as PCP - General (Family Medicine)   SUMMARY OF HEMATOLOGIC HISTORY:  # Iron deficiency Anemia [sec to gastric bypass]; FEB 2017- IV ferrahem x4   # Hx DVT of LLE [s/p s/p Surgery- 2011- 3 months anti-coag]; # Hx of factor V leiden [mom]  # 2012- Gastric by pass-   INTERVAL HISTORY:  Patient is here for follow-up of her iron deficiency anemia.  Patient had 2 IV Feraheme  So far.  She notices some improvement in her fatigue but not completely resolved.   patient states that she is undergoing an upper endoscopy to rule out a gastric ulcer.    Patient states that her mother was recently diagnosed with factor V Leiden;  And she also had a prior history of left lower extremity DVT after Left knee surgery. She is currently not on any anti-coagulation.    REVIEW OF SYSTEMS:  A complete 10 point review of system is done which is negative except mentioned above/history of present illness.   PAST MEDICAL HISTORY :  Past Medical History  Diagnosis Date  . Gastric ulcer   . DVT (deep venous thrombosis) ([place])     following first knee surgery in 2011  . Hypothyroid     PAST SURGICAL HISTORY :   Past Surgical History  Procedure Laterality Date  . Gastric bypass      2012, revision 2013  . Cholecystectomy      1998  . Ankle fracture surgery      1994, 1996  . Dilation and curettage of uterus      1994, 2001  . Tubal ligation      2004  . Tooth extraction      2010  . Knee arthroscopy      2011, 2013, 2015    to rule out gastric ulcer. FAMILY HISTORY :   Family History  Problem Relation Age of Onset  . Arthritis      parent  . Breast cancer      aunt, grandmother  . Hyperlipidemia      parent  . Stroke      parent  . Hypertension      parent  . Kidney disease      parent, grandparent  . Diabetes      parent, grandparent  . Renal cancer      parent    SOCIAL HISTORY:    Social History  Substance Use Topics  . Smoking status: Never Smoker   . Smokeless tobacco: Not on file  . Alcohol Use: No    ALLERGIES:  is allergic to paxil and penicillins.  MEDICATIONS:  Current Outpatient Prescriptions  Medication Sig Dispense Refill  . omeprazole (PRILOSEC) 40 MG capsule Take 1 capsule (40 mg total) by mouth daily. 90 capsule 3  . thyroid (ARMOUR) 90 MG tablet Take 90 mg by mouth daily.    . Vitamin D, Ergocalciferol, (DRISDOL) 50000 UNITS CAPS capsule Take 1 capsule (50,000 Units total) by mouth every 7 (seven) days. 30 capsule 0   No current facility-administered medications for this visit.    PHYSICAL EXAMINATION:   BP 110/82 mmHg  Pulse 78  Temp(Src) 97.9 F (36.6 C)  Resp 18  Wt 241 lb 6.5 oz (109.5 kg)  Filed Weights   03/05/15 1023  Weight: 241 lb 6.5 oz (109.5 kg)    GENERAL: Well-nourished well-developed; Alert, no distress and comfortable.   Alone.  EYES: no pallor or icterus OROPHARYNX: no thrush or ulceration; good dentition  NECK: supple, no masses felt LYMPH:  no palpable lymphadenopathy in the cervical, axillary or inguinal regions LUNGS: clear to auscultation and  No wheeze or crackles HEART/CVS: regular rate & rhythm and no murmurs; No lower extremity edema ABDOMEN:abdomen soft, non-tender and normal bowel sounds Musculoskeletal:no cyanosis of digits and no clubbing  PSYCH: alert & oriented x 3 with fluent speech NEURO: no focal motor/sensory deficits SKIN:  no rashes or significant lesions  LABORATORY DATA:  I have reviewed the data as listed    Component Value Date/Time   NA 137 10/01/2014 1132   K 4.1 10/01/2014 1132   CL 106 10/01/2014 1132   CO2 24 10/01/2014 1132   GLUCOSE 76 10/01/2014 1132   BUN 21 10/01/2014 1132   CREATININE 0.76 10/01/2014 1132   CALCIUM 9.1 10/01/2014 1132   PROT 7.1 10/01/2014 1132   ALBUMIN 4.0 10/01/2014 1132   AST 13 10/01/2014 1132   ALT 10 10/01/2014 1132   ALKPHOS 84 10/01/2014  1132   BILITOT 0.3 10/01/2014 1132    No results found for: SPEP, UPEP  Lab Results  Component Value Date   WBC 8.3 03/03/2015   NEUTROABS 5.2 03/03/2015   HGB 12.5 03/03/2015   HCT 37.6 03/03/2015   MCV 87.2 03/03/2015   PLT 296 03/03/2015      Chemistry      Component Value Date/Time   NA 137 10/01/2014 1132   K 4.1 10/01/2014 1132   CL 106 10/01/2014 1132   CO2 24 10/01/2014 1132   BUN 21 10/01/2014 1132   CREATININE 0.76 10/01/2014 1132      Component Value Date/Time   CALCIUM 9.1 10/01/2014 1132   ALKPHOS 84 10/01/2014 1132   AST 13 10/01/2014 1132   ALT 10 10/01/2014 1132   BILITOT 0.3 10/01/2014 1132       RADIOGRAPHIC STUDIES: I have personally reviewed the radiological images as listed and agreed with the findings in the report. No results found.   ASSESSMENT & PLAN:   # IRON DEFICIENCY ANEMIA sec to gastric bypass-  Patient's hemoglobin is up status post 2 IV iron infusions;  Today's 12.  I recommend 2 more  IV iron infusions.   #  Also discussed that she'll likely need maintenance IV iron infusions moving on.  She'll follow-up with [place] in approximately 4 months/ possible IV iron;  And labs  One week prior.  # Hx of factor V Leiden  mother/Hx of LLE DVT-  Check factor V Leiden.  Counselled regarding genetic testing.   # 15 minutes face-to-face with the patient discussing the above plan of care; more than 50% of time spent on natural history; counseling and coordination.     Cammie Sickle, MD 03/05/2015 10:36 AM

## 2015-03-11 LAB — FACTOR 5 LEIDEN

## 2015-03-12 ENCOUNTER — Inpatient Hospital Stay: Payer: Managed Care, Other (non HMO) | Attending: Internal Medicine

## 2015-03-12 ENCOUNTER — Other Ambulatory Visit: Payer: Self-pay | Admitting: Internal Medicine

## 2015-03-12 ENCOUNTER — Telehealth: Payer: Self-pay | Admitting: Internal Medicine

## 2015-03-12 VITALS — BP 107/73 | HR 68 | Resp 20

## 2015-03-12 DIAGNOSIS — D6851 Activated protein C resistance: Secondary | ICD-10-CM | POA: Insufficient documentation

## 2015-03-12 DIAGNOSIS — Z79899 Other long term (current) drug therapy: Secondary | ICD-10-CM | POA: Insufficient documentation

## 2015-03-12 DIAGNOSIS — D509 Iron deficiency anemia, unspecified: Secondary | ICD-10-CM

## 2015-03-12 DIAGNOSIS — Z9884 Bariatric surgery status: Secondary | ICD-10-CM | POA: Diagnosis not present

## 2015-03-12 MED ORDER — SODIUM CHLORIDE 0.9 % IV SOLN
INTRAVENOUS | Status: DC
  Administered 2015-03-12: 14:00:00 via INTRAVENOUS
  Filled 2015-03-12: qty 1000

## 2015-03-12 MED ORDER — SODIUM CHLORIDE 0.9 % IJ SOLN
10.0000 mL | INTRAMUSCULAR | Status: DC | PRN
  Filled 2015-03-12: qty 10

## 2015-03-12 MED ORDER — FERUMOXYTOL INJECTION 510 MG/17 ML
510.0000 mg | Freq: Once | INTRAVENOUS | Status: AC
  Administered 2015-03-12: 510 mg via INTRAVENOUS
  Filled 2015-03-12: qty 17

## 2015-03-12 NOTE — Telephone Encounter (Signed)
Left message for the patient to call me back regarding the results of her factor V Leiden-heterozygosity

## 2015-03-12 NOTE — Telephone Encounter (Signed)
I spoke to patient- Re: Factor V Leiden heterozygosity; recommend- prophylactic anticoagulation after her orthopedic surgery; BUT NO long term anti-coagulation.. She is asked to talk to [place]- if needed prior to surgery.Whitney Hill

## 2015-04-09 ENCOUNTER — Ambulatory Visit (INDEPENDENT_AMBULATORY_CARE_PROVIDER_SITE_OTHER): Payer: Managed Care, Other (non HMO) | Admitting: Family Medicine

## 2015-04-09 VITALS — BP 114/70 | HR 78 | Temp 98.6°F | Ht 65.63 in | Wt 244.0 lb

## 2015-04-09 DIAGNOSIS — Z01818 Encounter for other preprocedural examination: Secondary | ICD-10-CM | POA: Diagnosis not present

## 2015-04-09 NOTE — Patient Instructions (Signed)
Nice to see you. We will finish your surgical paper work and send it to Engineer, production.

## 2015-04-09 NOTE — Progress Notes (Signed)
Pre visit review using our clinic review tool, if applicable. No additional management support is needed unless otherwise documented below in the visit note. 

## 2015-04-12 DIAGNOSIS — Z01818 Encounter for other preprocedural examination: Secondary | ICD-10-CM | POA: Insufficient documentation

## 2015-04-12 NOTE — Progress Notes (Signed)
Patient ID: Whitney Hill, female   DOB: 09/03/1970, 45 y.o.   MRN: [id number]  Tommi Rumps, MD Phone: [phone]  Whitney Hill is a 45 y.o. female who presents today for follow-up.  Patient presents for presurgical evaluation for knee surgery. She also notes she is considering gastric bypass surgery and has seen a bariatric surgeon in [place] for this. They're considering possible revision. They think she meets the criteria for this. Consider the duodenal switch.  Of note previously patient was found to be anemic. She has followed up with hematology and had 4 iron transfusions. Her hemoglobin most recently is up to 12.5. They also found that she has a factor V Leiden mutation.  Patient notes no chest pain or breathlessness when climbing up several flights of stairs. She is able to do heavy work around the house with no issues. She has no history of kidney disease. No one in her family has had an issue with anesthetic. She's never had a heart attack. She's never been diagnosed with an irregular heartbeat. She's never had a stroke. She's never had any problems with anesthesia previously. No history of epilepsy or seizures. No issues with pain, stiffness or arthritis in her jaw or neck. She does have a history of hypothyroidism though this is well controlled. She does not suffer from angina. No history of liver disease. No history of heart failure. No history of asthma. She does not have diabetes. No history of bronchitis. No sleep apnea symptoms. She does not smoke. She denies chest pain, shortness of breath, syncope, palpitations, history of hypertension, and history of stroke. Of note she does have a history of DVT following last surgery. She was on Coumadin for 3 months following this.   ROS see history of present illness  Objective  Physical Exam Filed Vitals:   04/09/15 1055  BP: 114/70  Pulse: 78  Temp: 98.6 F (37 C)    BP Readings from Last 3 Encounters:    04/09/15 114/70  03/12/15 107/73  03/05/15 107/74   Wt Readings from Last 3 Encounters:  04/09/15 244 lb (110.678 kg)  03/05/15 241 lb 6.5 oz (109.5 kg)  01/29/15 242 lb 4.6 oz (109.9 kg)    Physical Exam  Constitutional: No distress.  HENT:  Head: Normocephalic and atraumatic.  Right Ear: External ear normal.  Left Ear: External ear normal.  Mouth/Throat: Oropharynx is clear and moist. No oropharyngeal exudate.  Eyes: Conjunctivae are normal. Pupils are equal, round, and reactive to light.  Neck: Neck supple.  Cardiovascular: Normal rate, regular rhythm and normal heart sounds.  Exam reveals no gallop and no friction rub.   No murmur heard. Pulmonary/Chest: Effort normal and breath sounds normal. No respiratory distress. She has no wheezes. She has no rales.  Abdominal: Soft. Bowel sounds are normal. She exhibits no distension. There is no tenderness. There is no rebound and no guarding.  Musculoskeletal:  Feet are warm and well-perfused, hands are warm and well perfused, 2+ DP pulses  Lymphadenopathy:    She has no cervical adenopathy.  Neurological: She is alert. Gait normal.  Skin: Skin is warm and dry. She is not diaphoretic.     Assessment/Plan: Please see individual problem list.  Preoperative evaluation to rule out surgical contraindication Patient presents for preoperative evaluation for possible bariatric surgery and left knee replacement surgery. She can complete 4 to 10 MET's with no issues. No complaints consisting of cardiac symptoms. Last TSH was mildly elevated. We will have patient increase her  armour thyroid to 105 mg. Anemia is much improved and in the normal range now. Lyndel Safe risk model used to calculate patient's risk for bariatric and knee surgery. Highest risk was 0.2%. Patient is low risk for cardiac and medical complications of surgery. We will forward this note to patient's bariatric surgery team and send in form for orthopedic surgery.    Tommi Rumps, MD [place]

## 2015-04-12 NOTE — Assessment & Plan Note (Addendum)
Patient presents for preoperative evaluation for possible bariatric surgery and left knee replacement surgery. She can complete 4 to 10 MET's with no issues. No complaints consisting of cardiac symptoms. Last TSH was mildly elevated. We will have patient increase her armour thyroid to 105 mg. Anemia is much improved and in the normal range now. Lyndel Safe risk model used to calculate patient's risk for bariatric and knee surgery. Highest risk was 0.2%. Patient is low risk for cardiac and medical complications of surgery. We will forward this note to patient's bariatric surgery team and send in form for orthopedic surgery.

## 2015-04-16 ENCOUNTER — Encounter: Payer: Self-pay | Admitting: Family Medicine

## 2015-04-19 ENCOUNTER — Telehealth: Payer: Self-pay | Admitting: Surgical

## 2015-04-19 MED ORDER — THYROID 97.5 MG PO TABS: 97.5000 mg | ORAL_TABLET | Freq: Every day | ORAL | Status: DC

## 2015-04-19 NOTE — Telephone Encounter (Signed)
Sent to pharmacy. The next highest dose is 97.5 mg. This was sent to her pharmacy.

## 2015-04-19 NOTE — Telephone Encounter (Signed)
-----   Message from Leone Haven, MD sent at 04/16/2015  8:29 AM EDT ----- Can you fax the records to her surgeon in [place]? Can you also call the patient and have her increase her armour thyroid to 105 mg daily. I can send in another prescription if need be. Eric.

## 2015-04-19 NOTE — Telephone Encounter (Signed)
Spoke with patient and informed her that she will need to increase thyroid medication. She would like new prescription sent to [facility].

## 2015-04-19 NOTE — Addendum Note (Signed)
Addended by: Caryl Bis,  G on: 04/19/2015 12:52 PM   Modules accepted: Orders

## 2015-04-20 ENCOUNTER — Telehealth: Payer: Self-pay | Admitting: *Deleted

## 2015-04-20 NOTE — Telephone Encounter (Signed)
Voicemail: Marjory Lies from [facility] stated a medication(medication not voiced) was sent over for patient, and it was on back order. He thought the provider would like to prescribe another medication. Marjory Lies from [place] [phone]

## 2015-04-26 ENCOUNTER — Encounter: Payer: Self-pay | Admitting: Family Medicine

## 2015-04-28 ENCOUNTER — Other Ambulatory Visit: Payer: Self-pay | Admitting: Family Medicine

## 2015-04-28 ENCOUNTER — Encounter: Payer: Self-pay | Admitting: Family Medicine

## 2015-04-28 DIAGNOSIS — E669 Obesity, unspecified: Secondary | ICD-10-CM

## 2015-05-04 ENCOUNTER — Ambulatory Visit (INDEPENDENT_AMBULATORY_CARE_PROVIDER_SITE_OTHER): Payer: Managed Care, Other (non HMO) | Admitting: Family Medicine

## 2015-05-04 ENCOUNTER — Encounter: Payer: Self-pay | Admitting: Family Medicine

## 2015-05-04 VITALS — Ht 65.0 in | Wt 243.9 lb

## 2015-05-04 DIAGNOSIS — E669 Obesity, unspecified: Secondary | ICD-10-CM

## 2015-05-04 NOTE — Patient Instructions (Addendum)
Recommendations - Next time you see Dr. Caryl Bis, ask him to check your vitamin D.  - Continue to plan ahead for your meals and snacks.    - Think about tomorrow, for example:  Where will I be around lunchtime?  When can I break for lunch? What will I have for lunch?  - Eat something small about every three hours while awake.     - Get more variety in your protein foods, i.e., cheese once a day instead of 3 X day.  In place of cheese, any of the other protein foods are fine, i.e., beef, pork, chicken, shrimp, egg.  Lunch might be a protein food plus some veg's on the side.  (Or use a veg soup to get some veg's, especially pureed.)  - Aim for getting at least one serving of a vegetable daily.  - While you are not able to exercise probably you'll need to limit calories to 1000 per day.   - I recommend whole, real food as much as possible rather than bars and shakes.    - Once you are able to move more, physical activity will be an important part of your weight management as well as your overall well-being.    - Meanwhile, are there chair exercises and upper body strength training you can do?  - The key to doing home exercises is having a specific list and routine you follow.    - Highly recommended: Keep a log of what you do, so you can see progress, and b/c it is motivating, and instructional.    - Call in June (at the latest) if you'd like an appt in July:  [phone].

## 2015-05-04 NOTE — Progress Notes (Signed)
Medical Nutrition Therapy:  Appt start time: [id number] end time:  1630. PCP:  Tommi Rumps, MD  Assessment:  Primary concerns today: Weight management.  Narine was referred by Dr. Tommi Rumps.  She had gastric bypass in Mar 2012 (was 272 lb at that time), and had a revision May 2013 b/c of an ulcer.  She lost ~2 lb/wk following her bariatric surgery.  In 2014/5, she started regaining weight, and was then diagnosed with hypothyroidism.  Korbyn has gained weight since moving to [place] from [place] 18 mo ago.  (Was 206 when she moved here, and 221 last summer.)  She is considering doing a revision to the duodenal switch, which removes the part of the stomach that produces grehlin.  Her insurance requires that she see a dietitian before they'll approve the surgery.    Annaleah lives with her husband, 34-YO daughter, 65-, and 13-YO son (has 3 grown children as well).   Pre-bypass, Quenesha drank a lot of soda, but she has found out that she has dumping syndrome if she tries to consume soda, so she has not had any for years - other than diet.    Still taking 50,000 IU q wk vit D, which she started about two years ago.  She is not aware of vit D testing recently.    Learning Readiness: Ready  Usual eating pattern includes 2 meals and 2-3 snacks per day.  Aims for 1000-1200 kcal/day:  Starts day with 3 slc Swiss chs; Lunch - chs & crackers; dinner is just meat, occasionally bread.   Frequent foods and beverages include Swiss cheese, crackers, chx, steak, salad, ranch drsng, water.  Avoided foods include high-sugar foods/drinks, milk, ice cream, rice, pasta, most veg's, most fruit.   Usual physical activity is limited b/c of knee injury (having knee replacement in May) and b/c of old ankle injury.    Yesterday's intake was abnormal b/c of GI bug:  Usual intake is as follows (~920 kcal):  B (7:30 AM)-   3 slc Swiss chs        210  Snk ( AM)-   --- L (11 PM)-  30 crackers & cheese OR [place], Swiss, &  avocado sandw  280 Snk (2 PM)-  1-2 slc Swiss chs       140 D (5 PM)-  2 oz steak OR chicken, grilled; sometimes spagh meat sauce 150  Snk ( PM)-  1-2 slc Swiss chs       140  Progress Towards Goal(s):  In progress.   Nutritional Diagnosis:  [place]-3.3 Overweight/obesity As related to energy imbalance.  As evidenced by BMI >35.    Intervention:  Nutrition education.  Handouts given during visit include:  AVS  Demonstrated degree of understanding via:  Teach Back  Barriers to learning/adherence to lifestyle change: Lifelong poor eating pattern and food choices.    Monitoring/Evaluation:  Dietary intake, exercise, and body weight will call for appt following knee surgery and rehab.

## 2015-05-06 ENCOUNTER — Other Ambulatory Visit: Payer: Self-pay | Admitting: Orthopedic Surgery

## 2015-05-06 NOTE — H&P (Signed)
TOTAL KNEE ADMISSION H&P  Patient is being admitted for left total knee arthroplasty.  Subjective:  Chief Complaint:left knee pain.  HPI: Whitney Hill, 45 y.o. female, has a history of pain and functional disability in the left knee due to arthritis and has failed non-surgical conservative treatments for greater than 12 weeks to includeNSAID's and/or analgesics, corticosteriod injections, viscosupplementation injections, flexibility and strengthening excercises, supervised PT with diminished ADL's post treatment, use of assistive devices, weight reduction as appropriate and activity modification.  Onset of symptoms was abrupt, starting 5 years ago with gradually worsening course since that time. The patient noted prior procedures on the knee to include  arthroscopy on the left knee(s), complicated by DVT post op 2012.  Patient currently rates pain in the left knee(s) at 7 out of 10 with activity. Patient has night pain, worsening of pain with activity and weight bearing and pain that interferes with activities of daily living.  Patient has evidence of periarticular osteophytes and joint space narrowing by imaging studies. This patient has had failure of microfracture to relieve pain. There is no active infection.  Patient Active Problem List   Diagnosis Date Noted  . Preoperative evaluation to rule out surgical contraindication 04/12/2015  . Anemia 01/14/2015  . Surgery, elective, encounter for clearance 01/14/2015  . Hypothyroidism 10/01/2014  . Left knee pain 10/01/2014  . Cramps, extremity 10/01/2014  . Allergic rhinitis 10/01/2014  . S/P gastric bypass 10/01/2014   Past Medical History  Diagnosis Date  . Gastric ulcer   . DVT (deep venous thrombosis) ([place])     following first knee surgery in 2011  . Hypothyroid     Past Surgical History  Procedure Laterality Date  . Gastric bypass      2012, revision 2013  . Cholecystectomy      1998  . Ankle fracture surgery      1994,  1996  . Dilation and curettage of uterus      1994, 2001  . Tubal ligation      2004  . Tooth extraction      2010  . Knee arthroscopy      2011, 2013, 2015     (Not in a hospital admission) Allergies  Allergen Reactions  . Paxil [Paroxetine Hcl] Anaphylaxis    Throat swelled shut  . Penicillins Hives    Social History  Substance Use Topics  . Smoking status: Never Smoker   . Smokeless tobacco: Not on file  . Alcohol Use: No    Family History  Problem Relation Age of Onset  . Arthritis      parent  . Breast cancer      aunt, grandmother  . Hyperlipidemia      parent  . Stroke      parent  . Hypertension      parent  . Kidney disease      parent, grandparent  . Diabetes      parent, grandparent  . Renal cancer      parent     Review of Systems  Skin: Positive for itching and rash.    Objective:  Physical Exam  Constitutional: She is oriented to person, place, and time.  HENT:  Head: Normocephalic and atraumatic.  Eyes: Conjunctivae and EOM are normal. Pupils are equal, round, and reactive to light.  Neck: Normal range of motion. Neck supple.  Cardiovascular: Normal rate, regular rhythm, normal heart sounds and intact distal pulses.   Respiratory: Effort normal and breath sounds normal.  GI: Soft. Bowel sounds are normal.  Musculoskeletal: She exhibits tenderness.       Left knee: She exhibits swelling and MCL laxity. Tenderness found. Medial joint line and MCL tenderness noted.       Right ankle: She exhibits decreased range of motion and swelling. Tenderness.  Neurological: She is alert and oriented to person, place, and time.  Decreased light touch right foot  Skin: Skin is warm and dry.     Psychiatric: She has a normal mood and affect. Her behavior is normal. Judgment and thought content normal.    Vital signs in last 24 hours: @VSRANGES @  Labs:   Estimated body mass index is 40.59 kg/(m^2) as calculated from the following:   Height as  of 05/04/15: 5\' 5"  (1.651 m).   Weight as of 05/04/15: 110.632 kg (243 lb 14.4 oz).   Imaging Review Plain radiographs demonstrate severe degenerative joint disease of the left knee(s). The overall alignment ismild varus. The bone quality appears to be adequate for age and reported activity level.  Assessment/Plan:  End stage arthritis, left knee   The patient history, physical examination, clinical judgment of the provider and imaging studies are consistent with end stage degenerative joint disease of the left knee(s) and total knee unicompartmental vs. total arthroplasty is deemed medically necessary. The treatment options including medical management, injection therapy arthroscopy and arthroplasty were discussed at length. The risks and benefits of total knee arthroplasty were presented and reviewed. The risks due to aseptic loosening, infection, stiffness, patella tracking problems, thromboembolic complications and other imponderables were discussed. The patient acknowledged the explanation, agreed to proceed with the plan and consent was signed. Patient is being admitted for inpatient treatment for surgery, pain control, PT, OT, prophylactic antibiotics, VTE prophylaxis, progressive ambulation and ADL's and discharge planning. The patient is planning to be discharged home with home health services

## 2015-05-13 ENCOUNTER — Ambulatory Visit (INDEPENDENT_AMBULATORY_CARE_PROVIDER_SITE_OTHER): Payer: Managed Care, Other (non HMO) | Admitting: Family Medicine

## 2015-05-13 ENCOUNTER — Encounter: Payer: Self-pay | Admitting: Family Medicine

## 2015-05-13 VITALS — BP 112/78 | HR 63 | Temp 98.4°F | Ht 65.63 in | Wt 244.2 lb

## 2015-05-13 DIAGNOSIS — Z9889 Other specified postprocedural states: Secondary | ICD-10-CM | POA: Diagnosis not present

## 2015-05-13 DIAGNOSIS — Z9884 Bariatric surgery status: Secondary | ICD-10-CM

## 2015-05-13 DIAGNOSIS — M722 Plantar fascial fibromatosis: Secondary | ICD-10-CM | POA: Insufficient documentation

## 2015-05-13 LAB — VITAMIN D 25 HYDROXY (VIT D DEFICIENCY, FRACTURES): VITD: 16.87 ng/mL — AB (ref 30.00–100.00)

## 2015-05-13 NOTE — Progress Notes (Signed)
Pre visit review using our clinic review tool, if applicable. No additional management support is needed unless otherwise documented below in the visit note. 

## 2015-05-13 NOTE — Patient Instructions (Signed)
Nice to see you. We'll check her vitamin D and call you with the results. Please follow up with the podiatrist for your foot pain.

## 2015-05-13 NOTE — Assessment & Plan Note (Signed)
Patient with plantar fasciitis in the left foot and also likely arthritic changes in her right foot leading to discomfort. She is followed by podiatry for this. They're planning on doing steroid injections to help with her discomfort. She'll continue to follow with podiatry for this.

## 2015-05-13 NOTE — Assessment & Plan Note (Addendum)
Status post gastric bypass. Weight has not improved recently with diet changes. She has seen a dietitian. She is working on her diet. Discomfort in her knee and feet keep her from being physically active. She'll continue to follow with the surgeon in [facility] for her surgery. We'll check vitamin D today.

## 2015-05-13 NOTE — Progress Notes (Signed)
Patient ID: Whitney Hill, female   DOB: [birth year], 45 y.o.   MRN: [id number]  Tommi Rumps, MD Phone: [phone]  Whitney Hill is a 45 y.o. female who presents today for follow-up.  Obesity: Patient presents for weight check today. She weighs 244 pounds. She is seeing a Psychologist, sport and exercise for gastric bypass revision. She has seen the dietitian and they went over diet. They talked about getting increased varying sources of protein. Getting more greens as well. Eating less cheese. She only eats cheese with breakfast now. She does note the discomfort in her left knee for which she is having surgery interferes with physical activity. No abdominal pain. No blood in her stool.  Plantar fasciitis: Patient is followed by podiatry for this. They're planning on doing steroid injections. She notes she has been hurting more frequently recently. She had some pain medication left over from her prior surgery that she took recently that did help all the pain. She has taken ibuprofen the last 2 days. She notes they're also talking of fusing her right foot due to arthritic changes.   ROS see history of present illness  Objective  Physical Exam Filed Vitals:   05/13/15 1309  BP: 112/78  Pulse: 63  Temp: 98.4 F (36.9 C)    BP Readings from Last 3 Encounters:  05/13/15 112/78  04/09/15 114/70  03/12/15 107/73   Wt Readings from Last 3 Encounters:  05/13/15 244 lb 3.2 oz (110.768 kg)  05/04/15 243 lb 14.4 oz (110.632 kg)  04/09/15 244 lb (110.678 kg)    Physical Exam  Constitutional: She is well-developed, well-nourished, and in no distress.  HENT:  Head: Normocephalic and atraumatic.  Cardiovascular: Normal rate, regular rhythm and normal heart sounds.   Pulmonary/Chest: Effort normal and breath sounds normal.  Abdominal: Soft. Bowel sounds are normal. She exhibits no distension. There is no tenderness. There is no rebound and no guarding.  Musculoskeletal:  Bilateral ankles with no  tenderness of the malleoli, no tenderness of the foot, no tenderness of the area of the plantar fascia or heel, no swelling in bilateral foot, 2+ DP pulses bilaterally  Neurological: She is alert.  Sensation light touch intact bilaterally extremities  Skin: Skin is warm and dry.     Assessment/Plan: Please see individual problem list.  S/P gastric bypass Status post gastric bypass. Weight has not improved recently with diet changes. She has seen a dietitian. She is working on her diet. Discomfort in her knee and feet keep her from being physically active. She'll continue to follow with the surgeon in [facility] for her surgery. We'll check vitamin D today.  Plantar fasciitis Patient with plantar fasciitis in the left foot and also likely arthritic changes in her right foot leading to discomfort. She is followed by podiatry for this. They're planning on doing steroid injections to help with her discomfort. She'll continue to follow with podiatry for this.    Orders Placed This Encounter  Procedures  . Vitamin D (25 hydroxy)    Tommi Rumps, MD [place]

## 2015-05-14 ENCOUNTER — Encounter (HOSPITAL_COMMUNITY): Payer: Self-pay

## 2015-05-14 ENCOUNTER — Encounter (HOSPITAL_COMMUNITY)
Admission: RE | Admit: 2015-05-14 | Discharge: 2015-05-14 | Disposition: A | Discharge status: Home / Self Care | Payer: Managed Care, Other (non HMO) | Source: Ambulatory Visit | Attending: Orthopedic Surgery | Admitting: Orthopedic Surgery

## 2015-05-14 DIAGNOSIS — M1712 Unilateral primary osteoarthritis, left knee: Secondary | ICD-10-CM | POA: Diagnosis not present

## 2015-05-14 DIAGNOSIS — R001 Bradycardia, unspecified: Secondary | ICD-10-CM | POA: Diagnosis not present

## 2015-05-14 DIAGNOSIS — Z01818 Encounter for other preprocedural examination: Secondary | ICD-10-CM | POA: Diagnosis present

## 2015-05-14 DIAGNOSIS — Z86718 Personal history of other venous thrombosis and embolism: Secondary | ICD-10-CM | POA: Insufficient documentation

## 2015-05-14 DIAGNOSIS — Z01812 Encounter for preprocedural laboratory examination: Secondary | ICD-10-CM | POA: Insufficient documentation

## 2015-05-14 DIAGNOSIS — Z0183 Encounter for blood typing: Secondary | ICD-10-CM | POA: Diagnosis not present

## 2015-05-14 HISTORY — DX: Activated protein C resistance: D68.51

## 2015-05-14 HISTORY — DX: Adverse effect of unspecified anesthetic, initial encounter: T41.45XA

## 2015-05-14 HISTORY — DX: Iron deficiency anemia, unspecified: D50.9

## 2015-05-14 HISTORY — DX: Other complications of anesthesia, initial encounter: T88.59XA

## 2015-05-14 LAB — URINALYSIS, ROUTINE W REFLEX MICROSCOPIC
Bilirubin Urine: NEGATIVE
GLUCOSE, UA: NEGATIVE mg/dL
Ketones, ur: NEGATIVE mg/dL
Leukocytes, UA: NEGATIVE
Nitrite: NEGATIVE
PH: 6 (ref 5.0–8.0)
Protein, ur: NEGATIVE mg/dL
SPECIFIC GRAVITY, URINE: 1.025 (ref 1.005–1.030)

## 2015-05-14 LAB — COMPREHENSIVE METABOLIC PANEL
ALK PHOS: 80 U/L (ref 38–126)
ALT: 16 U/L (ref 14–54)
ANION GAP: 10 (ref 5–15)
AST: 20 U/L (ref 15–41)
Albumin: 4 g/dL (ref 3.5–5.0)
BILIRUBIN TOTAL: 0.4 mg/dL (ref 0.3–1.2)
BUN: 15 mg/dL (ref 6–20)
CALCIUM: 9.3 mg/dL (ref 8.9–10.3)
CO2: 22 mmol/L (ref 22–32)
Chloride: 108 mmol/L (ref 101–111)
Creatinine, Ser: 0.8 mg/dL (ref 0.44–1.00)
GFR calc non Af Amer: >60 mL/min (ref >60)
Glucose, Bld: 96 mg/dL (ref 65–99)
Potassium: 3.5 mmol/L (ref 3.5–5.1)
SODIUM: 140 mmol/L (ref 135–145)
TOTAL PROTEIN: 7 g/dL (ref 6.5–8.1)

## 2015-05-14 LAB — SURGICAL PCR SCREEN
MRSA, PCR: NEGATIVE
STAPHYLOCOCCUS AUREUS: NEGATIVE

## 2015-05-14 LAB — TYPE AND SCREEN
ABO/RH(D): A POS
Antibody Screen: NEGATIVE

## 2015-05-14 LAB — URINE MICROSCOPIC-ADD ON

## 2015-05-14 LAB — APTT: APTT: 28 s (ref 24–37)

## 2015-05-14 LAB — CBC WITH DIFFERENTIAL/PLATELET
BASOS PCT: 0 %
Basophils Absolute: 0 10*3/uL (ref 0.0–0.1)
EOS ABS: 0.2 10*3/uL (ref 0.0–0.7)
Eosinophils Relative: 2 %
HEMATOCRIT: 41.3 % (ref 36.0–46.0)
HEMOGLOBIN: 13.5 g/dL (ref 12.0–15.0)
Lymphocytes Relative: 28 %
Lymphs Abs: 2.6 10*3/uL (ref 0.7–4.0)
MCH: 31.8 pg (ref 26.0–34.0)
MCHC: 32.7 g/dL (ref 30.0–36.0)
MCV: 97.2 fL (ref 78.0–100.0)
Monocytes Absolute: 0.5 10*3/uL (ref 0.1–1.0)
Monocytes Relative: 6 %
NEUTROS ABS: 6 10*3/uL (ref 1.7–7.7)
NEUTROS PCT: 64 %
Platelets: 312 10*3/uL (ref 150–400)
RBC: 4.25 MIL/uL (ref 3.87–5.11)
RDW: 14.9 % (ref 11.5–15.5)
WBC: 9.3 10*3/uL (ref 4.0–10.5)

## 2015-05-14 LAB — PROTIME-INR
INR: 1.03 (ref 0.00–1.49)
Prothrombin Time: 13.7 seconds (ref 11.6–15.2)

## 2015-05-14 LAB — HCG, SERUM, QUALITATIVE: PREG SERUM: NEGATIVE

## 2015-05-14 LAB — ABO/RH: ABO/RH(D): A POS

## 2015-05-14 MED ORDER — CHLORHEXIDINE GLUCONATE 4 % EX LIQD: 60.0000 mL | Freq: Once | CUTANEOUS | Status: DC

## 2015-05-14 NOTE — Progress Notes (Signed)
Pt states had to see cardiologist prior to last knee surgery 2012 in [place]. Per pt facility name [facility] in [place], [place].  She could not recall doctor name or test performed.  Will request cardiac records from [facility] facility

## 2015-05-14 NOTE — Pre-Procedure Instructions (Signed)
    Whitney Hill  05/14/2015      [id number] - [place], [place] - [address] [place] [address] Phone: [phone] Fax: [phone]    Your procedure is scheduled on May 25, 2015.  Report to [facility] Admitting at 5:30 A.M.  Call this number if you have problems the morning of surgery:  [phone]   Remember:  Do not eat food or drink liquids after midnight.  Take these medicines the morning of surgery with A SIP OF WATER : thyroid 97.5 MG , omeprazole (PRILOSEC)   STOP ASPIRIN, HERBAL MEDICATIONS, ADVIL, IBUPROFEN, ALEVE ONE WEEK PRIOR TO SURGERY   Do not wear jewelry, make-up or nail polish.  Do not wear lotions, powders, or perfumes.  You may wear deodorant.  Do not shave 48 hours prior to surgery.    Do not bring valuables to the hospital.  [facility] is not responsible for any belongings or valuables.  Contacts, dentures or bridgework may not be worn into surgery.  Leave your suitcase in the car.  After surgery it may be brought to your room.  For patients admitted to the hospital, discharge time will be determined by your treatment team.    Special instructions:  PREPARING FOR SURGERY  Please read over the following fact sheets that you were given. Pain Booklet, Coughing and Deep Breathing, Blood Transfusion Information, Total Joint Packet and Surgical Site Infection Prevention

## 2015-05-17 ENCOUNTER — Encounter (HOSPITAL_COMMUNITY): Payer: Self-pay

## 2015-05-17 NOTE — Progress Notes (Signed)
Anesthesia Chart Review: Patient is a 45 year old female scheduled for left knee unicompartmental versus TKA on 05/25/15 by Dr. Edmonia Lynch.  History includes non-smoker, hypothyroidism, gastric bypass '12 with revision '13 for gastric ulcer, iron deficiency anemia, cholecystectomy, knee surgery '11 complicated by post-operative LLE DVT with factor V Leiden-heterozygosity (diagnosed 03/05/15). She has had itching after anesthesia in the past. BMI is 40 consistent with morbid obesity. She is considering another revision of her gastric bypass in [place].   PCP is Dr. Tommi Rumps, newly established 10/01/14. He last saw her on 04/16/15 for follow-up and preoperative evaluation. He wrote, "Patient presents for preoperative evaluation for possible bariatric surgery and left knee replacement surgery. She can complete 4 to 10 MET's with no issues. No complaints consisting of cardiac symptoms. Last TSH was mildly elevated. We will have patient increase her armour thyroid to 105 mg. Anemia is much improved and in the normal range now. Lyndel Safe risk model used to calculate patient's risk for bariatric and knee surgery. Highest risk was 0.2%. Patient is low risk for cardiac and medical complications of surgery. We will forward this note to patient's bariatric surgery team and send in form for orthopedic surgery."   Saw hematologist Dr. Roxana Hires 01/29/15. Feraheme was recommended for treatment of her anemia, along with periodic monitoring of B12 and folate levels. She was last seen by Dr. Charlaine Dalton who wrote, "I spoke to patient- Re: Factor V Leiden heterozygosity; recommend- prophylactic anticoagulation after her orthopedic surgery; BUT NO long term anti-coagulation.. She is asked to talk to [place]- if needed prior to surgery."   Meds include B12, Prilosec, thyroid, Drisdol.  PAT Vitals: BP 105/62, HR 71, RR 20, T 36.5C, O2 sat 95%.  05/14/15 EKG: SB at 58 bpm, non-specific ST/T wave changes. She is not  routinely followed by a cardiologist, but reportedly had to see one at [facility] in [place], [place] prior to undergoing gastric bypass in 2012. She was unclear of who she saw or what and if cardiac tests were done. The PAT RN note indicate she was requesting records, but since results are > 5 years ago they would be outdated.    Preoperative labs noted. CBC, CMET, PT/PTT WNL. T&S done. UA showed negative nitrites, leukocytes. Serum pregnancy test was negative.   If any additional records received from [place] then I can review, but otherwise based on current records available I would anticipate that she could proceed as planned if no acute changes.  [place]reau Hugh [facility] Short Stay Center/Anesthesiology Phone [phone] 05/17/2015 3:22 PM

## 2015-05-24 NOTE — Anesthesia Preprocedure Evaluation (Addendum)
Anesthesia Evaluation  Patient identified by MRN, date of birth, ID band Patient awake    Reviewed: Allergy & Precautions, NPO status , Patient's Chart, lab work & pertinent test results  History of Anesthesia Complications (+) history of anesthetic complications  Airway Mallampati: I  TM Distance: >3 FB Neck ROM: Full    Dental  (+) Teeth Intact, Poor Dentition   Pulmonary neg pulmonary ROS,    breath sounds clear to auscultation       Cardiovascular negative cardio ROS   Rhythm:Regular Rate:Normal     Neuro/Psych negative neurological ROS  negative psych ROS   GI/Hepatic Neg liver ROS, PUD, GERD  Medicated,  Endo/Other  Hypothyroidism   Renal/GU negative Renal ROS  negative genitourinary   Musculoskeletal negative musculoskeletal ROS (+)   Abdominal (+) + obese,   Peds negative pediatric ROS (+)  Hematology negative hematology ROS (+)   Anesthesia Other Findings   Reproductive/Obstetrics negative OB ROS                            Anesthesia Physical Anesthesia Plan  ASA: III  Anesthesia Plan: General   Post-op Pain Management: GA combined w/ Regional for post-op pain   Induction: Intravenous  Airway Management Planned: LMA  Additional Equipment:   Intra-op Plan:   Post-operative Plan: Extubation in OR  Informed Consent: I have reviewed the patients History and Physical, chart, labs and discussed the procedure including the risks, benefits and alternatives for the proposed anesthesia with the patient or authorized representative who has indicated his/her understanding and acceptance.   Dental advisory given  Plan Discussed with:   Anesthesia Plan Comments: (Will discuss spinal anesthetic in the AM.)       Anesthesia Quick Evaluation

## 2015-05-25 ENCOUNTER — Encounter (HOSPITAL_COMMUNITY)
Admission: RE | Disposition: A | Discharge status: Home Health | Payer: Self-pay | Source: Ambulatory Visit | Attending: Orthopedic Surgery

## 2015-05-25 ENCOUNTER — Inpatient Hospital Stay (HOSPITAL_COMMUNITY): Payer: Managed Care, Other (non HMO)

## 2015-05-25 ENCOUNTER — Inpatient Hospital Stay (HOSPITAL_COMMUNITY): Payer: Managed Care, Other (non HMO) | Admitting: Vascular Surgery

## 2015-05-25 ENCOUNTER — Inpatient Hospital Stay (HOSPITAL_COMMUNITY): Payer: Managed Care, Other (non HMO) | Admitting: Certified Registered Nurse Anesthetist

## 2015-05-25 ENCOUNTER — Inpatient Hospital Stay (HOSPITAL_COMMUNITY)
Admission: RE | Admit: 2015-05-25 | Discharge: 2015-05-26 | DRG: 470 | Disposition: A | Discharge status: Home Health | Payer: Managed Care, Other (non HMO) | Source: Ambulatory Visit | Attending: Orthopedic Surgery | Admitting: Orthopedic Surgery

## 2015-05-25 DIAGNOSIS — E669 Obesity, unspecified: Secondary | ICD-10-CM | POA: Diagnosis present

## 2015-05-25 DIAGNOSIS — M1712 Unilateral primary osteoarthritis, left knee: Principal | ICD-10-CM | POA: Diagnosis present

## 2015-05-25 DIAGNOSIS — Z86718 Personal history of other venous thrombosis and embolism: Secondary | ICD-10-CM

## 2015-05-25 DIAGNOSIS — M93269 Osteochondritis dissecans, unspecified knee: Secondary | ICD-10-CM | POA: Diagnosis present

## 2015-05-25 DIAGNOSIS — Z6841 Body Mass Index (BMI) 40.0 and over, adult: Secondary | ICD-10-CM | POA: Diagnosis not present

## 2015-05-25 DIAGNOSIS — Z9884 Bariatric surgery status: Secondary | ICD-10-CM

## 2015-05-25 DIAGNOSIS — E039 Hypothyroidism, unspecified: Secondary | ICD-10-CM | POA: Diagnosis present

## 2015-05-25 DIAGNOSIS — M25562 Pain in left knee: Secondary | ICD-10-CM | POA: Diagnosis present

## 2015-05-25 DIAGNOSIS — Z9889 Other specified postprocedural states: Secondary | ICD-10-CM

## 2015-05-25 HISTORY — DX: Headache: R51

## 2015-05-25 HISTORY — DX: Unspecified osteoarthritis, unspecified site: M19.90

## 2015-05-25 HISTORY — DX: Headache, unspecified: R51.9

## 2015-05-25 HISTORY — PX: TOTAL KNEE ARTHROPLASTY: SHX125

## 2015-05-25 SURGERY — ARTHROPLASTY, KNEE, TOTAL
Anesthesia: General | Site: Knee | Laterality: Left

## 2015-05-25 MED ORDER — MORPHINE SULFATE (PF) 2 MG/ML IV SOLN
2.0000 mg | INTRAVENOUS | Status: DC | PRN
  Administered 2015-05-25 (×2): 2 mg via INTRAVENOUS
  Filled 2015-05-25 (×2): qty 1

## 2015-05-25 MED ORDER — GLYCOPYRROLATE 0.2 MG/ML IJ SOLN
INTRAMUSCULAR | Status: DC | PRN
  Administered 2015-05-25: .2 mg via INTRAVENOUS

## 2015-05-25 MED ORDER — ACETAMINOPHEN 325 MG PO TABS: 650.0000 mg | ORAL_TABLET | Freq: Four times a day (QID) | ORAL | Status: DC | PRN

## 2015-05-25 MED ORDER — DIPHENHYDRAMINE HCL 50 MG/ML IJ SOLN
INTRAMUSCULAR | Status: AC
  Filled 2015-05-25: qty 1

## 2015-05-25 MED ORDER — DEXTROSE-NACL 5-0.45 % IV SOLN
INTRAVENOUS | Status: AC
  Administered 2015-05-25: 12:00:00 via INTRAVENOUS

## 2015-05-25 MED ORDER — THYROID 60 MG PO TABS
97.5000 mg | ORAL_TABLET | Freq: Every day | ORAL | Status: DC
  Administered 2015-05-26: 90 mg via ORAL
  Filled 2015-05-25: qty 1

## 2015-05-25 MED ORDER — OXYCODONE HCL 5 MG PO TABS
5.0000 mg | ORAL_TABLET | ORAL | Status: DC | PRN
  Administered 2015-05-25 – 2015-05-26 (×5): 10 mg via ORAL
  Filled 2015-05-25 (×5): qty 2

## 2015-05-25 MED ORDER — METOCLOPRAMIDE HCL 5 MG/ML IJ SOLN: 5.0000 mg | Freq: Three times a day (TID) | INTRAMUSCULAR | Status: DC | PRN

## 2015-05-25 MED ORDER — DEXAMETHASONE SODIUM PHOSPHATE 10 MG/ML IJ SOLN
10.0000 mg | Freq: Once | INTRAMUSCULAR | Status: AC
  Administered 2015-05-26: 10 mg via INTRAVENOUS
  Filled 2015-05-25: qty 1

## 2015-05-25 MED ORDER — CLINDAMYCIN PHOSPHATE 900 MG/50ML IV SOLN
900.0000 mg | INTRAVENOUS | Status: AC
  Administered 2015-05-25: 900 mg via INTRAVENOUS
  Filled 2015-05-25: qty 50

## 2015-05-25 MED ORDER — ONDANSETRON HCL 4 MG/2ML IJ SOLN: 4.0000 mg | Freq: Four times a day (QID) | INTRAMUSCULAR | Status: DC | PRN

## 2015-05-25 MED ORDER — MIDAZOLAM HCL 2 MG/2ML IJ SOLN
INTRAMUSCULAR | Status: AC
  Filled 2015-05-25: qty 2

## 2015-05-25 MED ORDER — SODIUM CHLORIDE 0.9 % IJ SOLN
INTRAMUSCULAR | Status: DC | PRN
  Administered 2015-05-25: 19 mL

## 2015-05-25 MED ORDER — LACTATED RINGERS IV SOLN
INTRAVENOUS | Status: DC | PRN
  Administered 2015-05-25 (×3): via INTRAVENOUS

## 2015-05-25 MED ORDER — PHENOL 1.4 % MT LIQD: 1.0000 | OROMUCOSAL | Status: DC | PRN

## 2015-05-25 MED ORDER — METHOCARBAMOL 500 MG PO TABS
500.0000 mg | ORAL_TABLET | Freq: Four times a day (QID) | ORAL | Status: DC | PRN
  Administered 2015-05-25 – 2015-05-26 (×3): 500 mg via ORAL
  Filled 2015-05-25 (×3): qty 1

## 2015-05-25 MED ORDER — SODIUM CHLORIDE 0.9 % IV SOLN: INTRAVENOUS | Status: DC

## 2015-05-25 MED ORDER — ACETAMINOPHEN 650 MG RE SUPP: 650.0000 mg | Freq: Four times a day (QID) | RECTAL | Status: DC | PRN

## 2015-05-25 MED ORDER — KETOROLAC TROMETHAMINE 30 MG/ML IJ SOLN
INTRAMUSCULAR | Status: AC
  Filled 2015-05-25: qty 1

## 2015-05-25 MED ORDER — MEPERIDINE HCL 25 MG/ML IJ SOLN: 6.2500 mg | INTRAMUSCULAR | Status: DC | PRN

## 2015-05-25 MED ORDER — DIPHENHYDRAMINE HCL 50 MG/ML IJ SOLN
INTRAMUSCULAR | Status: DC | PRN
  Administered 2015-05-25: 12.5 mg via INTRAVENOUS

## 2015-05-25 MED ORDER — 0.9 % SODIUM CHLORIDE (POUR BTL) OPTIME
TOPICAL | Status: DC | PRN
  Administered 2015-05-25: 1000 mL

## 2015-05-25 MED ORDER — LIDOCAINE HCL (CARDIAC) 20 MG/ML IV SOLN
INTRAVENOUS | Status: DC | PRN
  Administered 2015-05-25: 60 mg via INTRAVENOUS

## 2015-05-25 MED ORDER — PROPOFOL 10 MG/ML IV BOLUS
INTRAVENOUS | Status: AC
  Filled 2015-05-25: qty 20

## 2015-05-25 MED ORDER — METHOCARBAMOL 500 MG PO TABS
ORAL_TABLET | ORAL | Status: AC
  Filled 2015-05-25: qty 1

## 2015-05-25 MED ORDER — CELECOXIB 200 MG PO CAPS
200.0000 mg | ORAL_CAPSULE | Freq: Two times a day (BID) | ORAL | Status: DC
  Administered 2015-05-25 – 2015-05-26 (×3): 200 mg via ORAL
  Filled 2015-05-25 (×4): qty 1

## 2015-05-25 MED ORDER — PHENYLEPHRINE HCL 10 MG/ML IJ SOLN
10.0000 mg | INTRAVENOUS | Status: DC | PRN
  Administered 2015-05-25: 30 ug/min via INTRAVENOUS

## 2015-05-25 MED ORDER — ARTIFICIAL TEARS OP OINT
TOPICAL_OINTMENT | OPHTHALMIC | Status: AC
  Filled 2015-05-25: qty 3.5

## 2015-05-25 MED ORDER — ONDANSETRON HCL 4 MG/2ML IJ SOLN
INTRAMUSCULAR | Status: AC
  Filled 2015-05-25: qty 2

## 2015-05-25 MED ORDER — KETOROLAC TROMETHAMINE 30 MG/ML IJ SOLN
INTRAMUSCULAR | Status: DC | PRN
  Administered 2015-05-25: 30 mg via INTRAVENOUS

## 2015-05-25 MED ORDER — ACETAMINOPHEN 10 MG/ML IV SOLN
INTRAVENOUS | Status: DC | PRN
  Administered 2015-05-25: 1000 mg via INTRAVENOUS

## 2015-05-25 MED ORDER — METHOCARBAMOL 1000 MG/10ML IJ SOLN: 500.0000 mg | Freq: Four times a day (QID) | INTRAVENOUS | Status: DC | PRN

## 2015-05-25 MED ORDER — MIDAZOLAM HCL 5 MG/5ML IJ SOLN
INTRAMUSCULAR | Status: DC | PRN
  Administered 2015-05-25: 2 mg via INTRAVENOUS

## 2015-05-25 MED ORDER — ONDANSETRON HCL 4 MG PO TABS: 4.0000 mg | ORAL_TABLET | Freq: Four times a day (QID) | ORAL | Status: DC | PRN

## 2015-05-25 MED ORDER — LACTATED RINGERS IV SOLN: INTRAVENOUS | Status: DC

## 2015-05-25 MED ORDER — SUFENTANIL CITRATE 50 MCG/ML IV SOLN
INTRAVENOUS | Status: AC
  Filled 2015-05-25: qty 1

## 2015-05-25 MED ORDER — ACETAMINOPHEN 10 MG/ML IV SOLN
INTRAVENOUS | Status: AC
  Filled 2015-05-25: qty 100

## 2015-05-25 MED ORDER — PROPOFOL 10 MG/ML IV BOLUS
INTRAVENOUS | Status: DC | PRN
Start: 2015-05-25 — End: 2015-05-25
  Administered 2015-05-25: 200 mg via INTRAVENOUS

## 2015-05-25 MED ORDER — ROCURONIUM BROMIDE 50 MG/5ML IV SOLN
INTRAVENOUS | Status: AC
  Filled 2015-05-25: qty 1

## 2015-05-25 MED ORDER — SUFENTANIL CITRATE 50 MCG/ML IV SOLN
INTRAVENOUS | Status: DC | PRN
  Administered 2015-05-25: 5 ug via INTRAVENOUS
  Administered 2015-05-25: 10 ug via INTRAVENOUS
  Administered 2015-05-25 (×2): 5 ug via INTRAVENOUS

## 2015-05-25 MED ORDER — MENTHOL 3 MG MT LOZG: 1.0000 | LOZENGE | OROMUCOSAL | Status: DC | PRN

## 2015-05-25 MED ORDER — PHENYLEPHRINE 40 MCG/ML (10ML) SYRINGE FOR IV PUSH (FOR BLOOD PRESSURE SUPPORT)
PREFILLED_SYRINGE | INTRAVENOUS | Status: AC
  Filled 2015-05-25: qty 10

## 2015-05-25 MED ORDER — BUPIVACAINE HCL (PF) 0.25 % IJ SOLN
INTRAMUSCULAR | Status: DC | PRN
  Administered 2015-05-25: 30 mL

## 2015-05-25 MED ORDER — BUPIVACAINE HCL (PF) 0.25 % IJ SOLN
INTRAMUSCULAR | Status: AC
Start: 2015-05-25 — End: 2015-05-25
  Filled 2015-05-25: qty 30

## 2015-05-25 MED ORDER — CEFAZOLIN SODIUM-DEXTROSE 2-4 GM/100ML-% IV SOLN
2.0000 g | Freq: Four times a day (QID) | INTRAVENOUS | Status: AC
  Administered 2015-05-25 (×2): 2 g via INTRAVENOUS
  Filled 2015-05-25 (×2): qty 100

## 2015-05-25 MED ORDER — SODIUM CHLORIDE 0.9 % IJ SOLN
INTRAMUSCULAR | Status: AC
  Filled 2015-05-25: qty 10

## 2015-05-25 MED ORDER — BUPIVACAINE-EPINEPHRINE (PF) 0.5% -1:200000 IJ SOLN
INTRAMUSCULAR | Status: DC | PRN
  Administered 2015-05-25: 28 mL via PERINEURAL

## 2015-05-25 MED ORDER — GLYCOPYRROLATE 0.2 MG/ML IV SOSY
PREFILLED_SYRINGE | INTRAVENOUS | Status: AC
Start: 2015-05-25 — End: 2015-05-25
  Filled 2015-05-25: qty 3

## 2015-05-25 MED ORDER — APIXABAN 2.5 MG PO TABS
2.5000 mg | ORAL_TABLET | Freq: Two times a day (BID) | ORAL | Status: DC
  Administered 2015-05-26 (×2): 2.5 mg via ORAL
  Filled 2015-05-25 (×2): qty 1

## 2015-05-25 MED ORDER — PROCHLORPERAZINE EDISYLATE 5 MG/ML IJ SOLN: 10.0000 mg | INTRAMUSCULAR | Status: DC | PRN

## 2015-05-25 MED ORDER — HYDROMORPHONE HCL 1 MG/ML IJ SOLN
0.2500 mg | INTRAMUSCULAR | Status: DC | PRN
  Administered 2015-05-25: 0.5 mg via INTRAVENOUS

## 2015-05-25 MED ORDER — DOCUSATE SODIUM 100 MG PO CAPS
100.0000 mg | ORAL_CAPSULE | Freq: Two times a day (BID) | ORAL | Status: DC
  Administered 2015-05-25 – 2015-05-26 (×3): 100 mg via ORAL
  Filled 2015-05-25 (×3): qty 1

## 2015-05-25 MED ORDER — PANTOPRAZOLE SODIUM 40 MG PO TBEC
40.0000 mg | DELAYED_RELEASE_TABLET | Freq: Every day | ORAL | Status: DC
  Administered 2015-05-26: 40 mg via ORAL
  Filled 2015-05-25: qty 1

## 2015-05-25 MED ORDER — SODIUM CHLORIDE 0.9 % IR SOLN
Status: DC | PRN
  Administered 2015-05-25 (×2): 1000 mL

## 2015-05-25 MED ORDER — METOCLOPRAMIDE HCL 5 MG PO TABS: 5.0000 mg | ORAL_TABLET | Freq: Three times a day (TID) | ORAL | Status: DC | PRN

## 2015-05-25 MED ORDER — ONDANSETRON HCL 4 MG/2ML IJ SOLN
INTRAMUSCULAR | Status: DC | PRN
  Administered 2015-05-25: 4 mg via INTRAVENOUS

## 2015-05-25 MED ORDER — HYDROMORPHONE HCL 1 MG/ML IJ SOLN
INTRAMUSCULAR | Status: AC
  Filled 2015-05-25: qty 1

## 2015-05-25 SURGICAL SUPPLY — 70 items
BANDAGE ELASTIC 6 VELCRO ST LF (GAUZE/BANDAGES/DRESSINGS) ×3 IMPLANT
BANDAGE ESMARK 6X9 LF (GAUZE/BANDAGES/DRESSINGS) ×1 IMPLANT
BLADE SAG 18X100X1.27 (BLADE) ×3 IMPLANT
BNDG COHESIVE 6X5 TAN STRL LF (GAUZE/BANDAGES/DRESSINGS) ×3 IMPLANT
BNDG ESMARK 6X9 LF (GAUZE/BANDAGES/DRESSINGS) ×3
BNDG GAUZE ELAST 4 BULKY (GAUZE/BANDAGES/DRESSINGS) ×3 IMPLANT
BONE CEMENT PALACOS R-G (Orthopedic Implant) ×3 IMPLANT
BOWL SMART MIX CTS (DISPOSABLE) ×3 IMPLANT
CAPT KNEE PARTIAL 2 ×3 IMPLANT
CEMENT BONE PALACOS R-G (Orthopedic Implant) ×1 IMPLANT
CEMENT BONE SIMPLEX SPEEDSET (Cement) IMPLANT
CLOSURE STERI-STRIP 1/2X4 (GAUZE/BANDAGES/DRESSINGS) ×2
CLSR STERI-STRIP ANTIMIC 1/2X4 (GAUZE/BANDAGES/DRESSINGS) ×4 IMPLANT
COVER SURGICAL LIGHT HANDLE (MISCELLANEOUS) ×3 IMPLANT
CUFF TOURNIQUET SINGLE 34IN LL (TOURNIQUET CUFF) ×3 IMPLANT
DRAPE EXTREMITY T 121X128X90 (DRAPE) ×3 IMPLANT
DRAPE U-SHAPE 47X51 STRL (DRAPES) ×3 IMPLANT
DRSG ADAPTIC 3X8 NADH LF (GAUZE/BANDAGES/DRESSINGS) ×3 IMPLANT
DRSG MEPILEX BORDER 4X8 (GAUZE/BANDAGES/DRESSINGS) ×3 IMPLANT
DRSG PAD ABDOMINAL 8X10 ST (GAUZE/BANDAGES/DRESSINGS) ×3 IMPLANT
DURAPREP 26ML APPLICATOR (WOUND CARE) ×6 IMPLANT
ELECT CAUTERY BLADE 6.4 (BLADE) ×3 IMPLANT
ELECT REM PT RETURN 9FT ADLT (ELECTROSURGICAL) ×3
ELECTRODE REM PT RTRN 9FT ADLT (ELECTROSURGICAL) ×1 IMPLANT
FACESHIELD WRAPAROUND (MASK) ×6 IMPLANT
GAUZE SPONGE 4X4 12PLY STRL (GAUZE/BANDAGES/DRESSINGS) ×3 IMPLANT
GAUZE SPONGE 4X4 16PLY XRAY LF (GAUZE/BANDAGES/DRESSINGS) ×3 IMPLANT
GLOVE BIO SURGEON STRL SZ7 (GLOVE) ×3 IMPLANT
GLOVE BIO SURGEON STRL SZ7.5 (GLOVE) ×6 IMPLANT
GLOVE BIOGEL PI IND STRL 7.0 (GLOVE) ×1 IMPLANT
GLOVE BIOGEL PI IND STRL 8 (GLOVE) ×1 IMPLANT
GLOVE BIOGEL PI INDICATOR 7.0 (GLOVE) ×2
GLOVE BIOGEL PI INDICATOR 8 (GLOVE) ×2
GOWN STRL REUS W/ TWL LRG LVL3 (GOWN DISPOSABLE) ×2 IMPLANT
GOWN STRL REUS W/ TWL XL LVL3 (GOWN DISPOSABLE) ×1 IMPLANT
GOWN STRL REUS W/TWL LRG LVL3 (GOWN DISPOSABLE) ×4
GOWN STRL REUS W/TWL XL LVL3 (GOWN DISPOSABLE) ×2
HANDPIECE INTERPULSE COAX TIP (DISPOSABLE) ×2
IMMOBILIZER KNEE 22 UNIV (SOFTGOODS) IMPLANT
IMMOBILIZER KNEE 24 THIGH 36 (MISCELLANEOUS) ×1 IMPLANT
IMMOBILIZER KNEE 24 UNIV (MISCELLANEOUS) ×3
KIT BASIN OR (CUSTOM PROCEDURE TRAY) ×3 IMPLANT
KIT ROOM TURNOVER OR (KITS) ×3 IMPLANT
LIQUID BAND (GAUZE/BANDAGES/DRESSINGS) ×3 IMPLANT
MANIFOLD NEPTUNE II (INSTRUMENTS) ×3 IMPLANT
NEEDLE 18GX1X1/2 (RX/OR ONLY) (NEEDLE) ×3 IMPLANT
NEEDLE 25GAX1.5 (MISCELLANEOUS) ×6 IMPLANT
NS IRRIG 1000ML POUR BTL (IV SOLUTION) ×3 IMPLANT
PACK BLADE SAW RECIP 70 3 PT (BLADE) ×3 IMPLANT
PACK TOTAL JOINT (CUSTOM PROCEDURE TRAY) ×3 IMPLANT
PACK UNIVERSAL I (CUSTOM PROCEDURE TRAY) ×3 IMPLANT
PAD ARMBOARD 7.5X6 YLW CONV (MISCELLANEOUS) ×3 IMPLANT
PADDING CAST ABS 6INX4YD NS (CAST SUPPLIES) ×2
PADDING CAST ABS COTTON 6X4 NS (CAST SUPPLIES) ×1 IMPLANT
SET HNDPC FAN SPRY TIP SCT (DISPOSABLE) ×1 IMPLANT
SPONGE GAUZE 4X4 12PLY STER LF (GAUZE/BANDAGES/DRESSINGS) ×3 IMPLANT
STAPLER VISISTAT 35W (STAPLE) IMPLANT
SUCTION FRAZIER HANDLE 10FR (MISCELLANEOUS) ×2
SUCTION TUBE FRAZIER 10FR DISP (MISCELLANEOUS) ×1 IMPLANT
SUT MNCRL AB 4-0 PS2 18 (SUTURE) ×3 IMPLANT
SUT MON AB 2-0 CT1 27 (SUTURE) IMPLANT
SUT MON AB 5-0 PS2 18 (SUTURE) ×6 IMPLANT
SUT VIC AB 0 CT1 27 (SUTURE) ×2
SUT VIC AB 0 CT1 27XBRD ANBCTR (SUTURE) ×1 IMPLANT
SUT VIC AB 1 CTX 36 (SUTURE) ×2
SUT VIC AB 1 CTX36XBRD ANBCTR (SUTURE) ×1 IMPLANT
SYRINGE 20CC LL (MISCELLANEOUS) ×3 IMPLANT
TOWEL OR 17X24 6PK STRL BLUE (TOWEL DISPOSABLE) ×3 IMPLANT
TOWEL OR 17X26 10 PK STRL BLUE (TOWEL DISPOSABLE) ×3 IMPLANT
TRAY FOLEY BAG SILVER LF 14FR (CATHETERS) IMPLANT

## 2015-05-25 NOTE — Progress Notes (Signed)
Orthopedic Tech Progress Note Patient Details:  Whitney Hill February 22, 1970 [id number] Ortho visit put on cpm at 2000 Patient ID: Karin Lieu, female   DOB: [birth year], 45 y.o.   MRN: [id number]   Braulio Bosch 05/25/2015, 7:59 PM

## 2015-05-25 NOTE — Interval H&P Note (Signed)
History and Physical Interval Note:  05/25/2015 7:28 AM  Whitney Hill  has presented today for surgery, with the diagnosis of OSTEOARTHRITIS  LEFT KNEE  The various methods of treatment have been discussed with the patient and family. After consideration of risks, benefits and other options for treatment, the patient has consented to  Procedure(s): LEFT TOTAL KNEE ARTHROPLASTY (Left) as a surgical intervention .  The patient's history has been reviewed, patient examined, no change in status, stable for surgery.  I have reviewed the patient's chart and labs.  Questions were answered to the patient's satisfaction.     ,  D

## 2015-05-25 NOTE — Progress Notes (Signed)
Orthopedic Tech Progress Note Patient Details:  Whitney Hill 06-17-1970 [id number]  CPM Left Knee CPM Left Knee: On Left Knee Flexion (Degrees): 90 Left Knee Extension (Degrees): 0 Additional Comments: Trapeze bar   [place] Pink 05/25/2015, 10:47 AM

## 2015-05-25 NOTE — Anesthesia Procedure Notes (Addendum)
Anesthesia Regional Block:  Femoral nerve block  Pre-Anesthetic Checklist: ,, timeout performed, Correct Patient, Correct Site, Correct Laterality, Correct Procedure, Correct Position, site marked, Risks and benefits discussed,  Surgical consent,  Pre-op evaluation,  At surgeon's request and post-op pain management  Laterality: Left and Lower  Prep: chloraprep       Needles:  Injection technique: Single-shot  Needle Type: Echogenic Needle     Needle Length: 9cm 9 cm Needle Gauge: 21 and 21 G    Additional Needles:  Procedures: ultrasound guided (picture in chart) Femoral nerve block Narrative:  Start time: 05/25/2015 6:40 AM End time: 05/25/2015 6:45 AM Injection made incrementally with aspirations every 5 mL.  Performed by: Personally  Anesthesiologist: CREWS, DAVID   Procedure Name: LMA Insertion Date/Time: 05/25/2015 7:38 AM Performed by: Izora Gala Pre-anesthesia Checklist: Patient identified, Emergency Drugs available, Suction available and Patient being monitored Patient Re-evaluated:Patient Re-evaluated prior to inductionOxygen Delivery Method: Circle system utilized Preoxygenation: Pre-oxygenation with 100% oxygen Intubation Type: IV induction Ventilation: Mask ventilation without difficulty LMA: LMA inserted LMA Size: 4.0 Placement Confirmation: positive ETCO2 Tube secured with: Tape Dental Injury: Teeth and Oropharynx as per pre-operative assessment     Anesthesia Regional Block:   Narrative:    Procedure Name: LMA Insertion Date/Time: 05/25/2015 7:38 AM Performed by: Izora Gala Pre-anesthesia Checklist: Patient identified, Emergency Drugs available, Suction available and Patient being monitored Patient Re-evaluated:Patient Re-evaluated prior to inductionOxygen Delivery Method: Circle system utilized Preoxygenation: Pre-oxygenation with 100% oxygen Intubation Type: IV induction Ventilation: Mask ventilation without difficulty LMA: LMA  inserted LMA Size: 4.0 Number of attempts: 1 Placement Confirmation: positive ETCO2 and breath sounds checked- equal and bilateral Tube secured with: Tape Dental Injury: Teeth and Oropharynx as per pre-operative assessment       Left FNB image

## 2015-05-25 NOTE — H&P (View-Only) (Signed)
TOTAL KNEE ADMISSION H&P  Patient is being admitted for left total knee arthroplasty.  Subjective:  Chief Complaint:left knee pain.  HPI: Whitney Hill, 45 y.o. female, has a history of pain and functional disability in the left knee due to arthritis and has failed non-surgical conservative treatments for greater than 12 weeks to includeNSAID's and/or analgesics, corticosteriod injections, viscosupplementation injections, flexibility and strengthening excercises, supervised PT with diminished ADL's post treatment, use of assistive devices, weight reduction as appropriate and activity modification.  Onset of symptoms was abrupt, starting 5 years ago with gradually worsening course since that time. The patient noted prior procedures on the knee to include  arthroscopy on the left knee(s), complicated by DVT post op 2012.  Patient currently rates pain in the left knee(s) at 7 out of 10 with activity. Patient has night pain, worsening of pain with activity and weight bearing and pain that interferes with activities of daily living.  Patient has evidence of periarticular osteophytes and joint space narrowing by imaging studies. This patient has had failure of microfracture to relieve pain. There is no active infection.  Patient Active Problem List   Diagnosis Date Noted  . Preoperative evaluation to rule out surgical contraindication 04/12/2015  . Anemia 01/14/2015  . Surgery, elective, encounter for clearance 01/14/2015  . Hypothyroidism 10/01/2014  . Left knee pain 10/01/2014  . Cramps, extremity 10/01/2014  . Allergic rhinitis 10/01/2014  . S/P gastric bypass 10/01/2014   Past Medical History  Diagnosis Date  . Gastric ulcer   . DVT (deep venous thrombosis) (HCC)     following first knee surgery in 2011  . Hypothyroid     Past Surgical History  Procedure Laterality Date  . Gastric bypass      2012, revision 2013  . Cholecystectomy      1998  . Ankle fracture surgery      1994,  1996  . Dilation and curettage of uterus      1994, 2001  . Tubal ligation      2004  . Tooth extraction      2010  . Knee arthroscopy      2011, 2013, 2015     (Not in a hospital admission) Allergies  Allergen Reactions  . Paxil [Paroxetine Hcl] Anaphylaxis    Throat swelled shut  . Penicillins Hives    Social History  Substance Use Topics  . Smoking status: Never Smoker   . Smokeless tobacco: Not on file  . Alcohol Use: No    Family History  Problem Relation Age of Onset  . Arthritis      parent  . Breast cancer      aunt, grandmother  . Hyperlipidemia      parent  . Stroke      parent  . Hypertension      parent  . Kidney disease      parent, grandparent  . Diabetes      parent, grandparent  . Renal cancer      parent     Review of Systems  Skin: Positive for itching and rash.    Objective:  Physical Exam  Constitutional: She is oriented to person, place, and time.  HENT:  Head: Normocephalic and atraumatic.  Eyes: Conjunctivae and EOM are normal. Pupils are equal, round, and reactive to light.  Neck: Normal range of motion. Neck supple.  Cardiovascular: Normal rate, regular rhythm, normal heart sounds and intact distal pulses.   Respiratory: Effort normal and breath sounds normal.    GI: Soft. Bowel sounds are normal.  Musculoskeletal: She exhibits tenderness.       Left knee: She exhibits swelling and MCL laxity. Tenderness found. Medial joint line and MCL tenderness noted.       Right ankle: She exhibits decreased range of motion and swelling. Tenderness.  Neurological: She is alert and oriented to person, place, and time.  Decreased light touch right foot  Skin: Skin is warm and dry.     Psychiatric: She has a normal mood and affect. Her behavior is normal. Judgment and thought content normal.    Vital signs in last 24 hours: @VSRANGES @  Labs:   Estimated body mass index is 40.59 kg/(m^2) as calculated from the following:   Height as  of 05/04/15: 5\' 5"  (1.651 m).   Weight as of 05/04/15: 110.632 kg (243 lb 14.4 oz).   Imaging Review Plain radiographs demonstrate severe degenerative joint disease of the left knee(s). The overall alignment ismild varus. The bone quality appears to be adequate for age and reported activity level.  Assessment/Plan:  End stage arthritis, left knee   The patient history, physical examination, clinical judgment of the provider and imaging studies are consistent with end stage degenerative joint disease of the left knee(s) and total knee unicompartmental vs. total arthroplasty is deemed medically necessary. The treatment options including medical management, injection therapy arthroscopy and arthroplasty were discussed at length. The risks and benefits of total knee arthroplasty were presented and reviewed. The risks due to aseptic loosening, infection, stiffness, patella tracking problems, thromboembolic complications and other imponderables were discussed. The patient acknowledged the explanation, agreed to proceed with the plan and consent was signed. Patient is being admitted for inpatient treatment for surgery, pain control, PT, OT, prophylactic antibiotics, VTE prophylaxis, progressive ambulation and ADL's and discharge planning. The patient is planning to be discharged home with home health services

## 2015-05-25 NOTE — Progress Notes (Signed)
Orthopedic Tech Progress Note Patient Details:  Whitney Hill 06-20-1970 [id number]  CPM Left Knee CPM Left Knee: On Left Knee Flexion (Degrees): 90 Left Knee Extension (Degrees): 0 Additional Comments: Trapeze bar   [place] Pink 05/25/2015, 2:56 PM

## 2015-05-25 NOTE — Op Note (Signed)
05/25/2015  9:13 AM  PATIENT:  Whitney Hill    PRE-OPERATIVE DIAGNOSIS:  OSTEOARTHRITIS  LEFT KNEE  POST-OPERATIVE DIAGNOSIS:  Same  PROCEDURE:  LEFT TOTAL KNEE ARTHROPLASTY  SURGEON:  , Ernesta Amble, MD  PHYSICIAN ASSISTANT: Roxan Hockey, PA-C, She was present and scrubbed throughout the case, critical for completion in a timely fashion, and for retraction, instrumentation, and closure.   ANESTHESIA:   General  PREOPERATIVE INDICATIONS:  Whitney Hill is a  45 y.o. female with a diagnosis of OSTEOARTHRITIS  LEFT KNEE who failed conservative measures and elected for surgical management.    The risks benefits and alternatives were discussed with the patient preoperatively including but not limited to the risks of infection, bleeding, nerve injury, cardiopulmonary complications, blood clots, the need for revision surgery, among others, and the patient was willing to proceed.  OPERATIVE IMPLANTS: Biomet [place] mobile bearing medial compartment arthroplasty. Femoral Component: small. Tibial tray: A, Size 5 poly.   OPERATIVE FINDINGS: Endstage grade 4 medial compartment osteoarthritis. No significant changes in the lateral or patellofemoral joint  OPERATIVE PROCEDURE: The patient was brought to the operating room placed in supine position. General anesthesia was administered. IV antibiotics were given. The lower extremity was placed in the legholder and prepped and draped in usual sterile fashion.  Time out was performed.  The leg was elevated and exsanguinated and the tourniquet was inflated. Anteromedial incision was performed, and I took care to preserve the MCL. Parapatellar incision was carried out, and the osteophytes were excised, along with the medial meniscus and a small portion of the fat pad.  The extra medullary tibial cutting jig was applied, using the spoon and the 58mm G-Clamp, and I took care to protect the anterior cruciate ligament insertion and the tibial  spine. The medial collateral ligament was also protected, and I resected my proximal tibia, matching the anatomic slope.   The proximal tibial bony cut was removed in one piece, and I turned my attention to the femur.  The intramedullary femoral rod was placed using the drill, and then using the appropriate reference, I assembled the femoral jig, setting my posterior cutting block. I resected my posterior femur, and then measured my gap.   I then used the mill to match the extension gap to the flexion gap. The gaps were then measured again with the appropriate feeler gauges. Once I had balanced flexion and extension gaps, I then completed the preparation of the femur.  I milled off the anterior aspect of the distal femur to prevent impingement. I also exposed the tibia, and selected the above-named component, and then used the cutting jig to prepare the keel slot on the tibia. I also used the awl to curette out the bone to complete the preparation of the keel. The back wall was intact.  I then placed trial components, and it was found to have excellent motion, and appropriate balance.  I then cemented the components into place, cementing the tibia first, removing all excess cement, and then cementing the femur.  All loose cement was removed.  The real polyethylene insert was applied manually, and the knee was taken through functional range of motion, and found to have excellent stability and restoration of joint motion, with excellent balance.  The wounds were irrigated copiously, and the parapatellar tissue closed with Vicryl, followed by Vicryl for the subcutaneous tissue, with routine closure with Steri-Strips and sterile gauze.  The tourniquet was released, and the patient was awakened and extubated and returned to  PACU in stable and satisfactory condition. There were no complications.  POSTOPERATIVE PLAN: DVT px will consist of SCD's and xarelto, WBAT     Renette Butters, MD  This  note was generated using a template and dragon dictation system. In light of that, I have reviewed the note and all aspects of it are applicable to this case. Any dictation errors are due to the computerized dictation system.

## 2015-05-25 NOTE — Progress Notes (Signed)
Physical Therapy Evaluation Patient Details Name: Whitney Hill MRN: [id number] DOB: 10/20/1970 Today's Date: 05/25/2015   History of Present Illness  Pt admitted 5/16 for elective L TKA. Pt with old L ankle surgery.  Clinical Impression  Pt is s/p TKA resulting in the deficits listed below (see PT Problem List). Pt tolerated OOB well for first time. Pt will benefit from skilled PT to increase their independence and safety with mobility to allow discharge to the venue listed below.      Follow Up Recommendations Home health PT;Supervision/Assistance - 24 hour    Equipment Recommendations  Rolling walker with 5" wheels;3in1 (PT)    Recommendations for Other Services       Precautions / Restrictions Precautions Precautions: Knee Precaution Booklet Issued: Yes (comment) Precaution Comments: went of precautions and HEP Required Braces or Orthoses: Knee Immobilizer - Left Knee Immobilizer - Left: On when out of bed or walking Restrictions Weight Bearing Restrictions: Yes LLE Weight Bearing: Weight bearing as tolerated      Mobility  Bed Mobility Overal bed mobility: Modified Independent             General bed mobility comments: pt able to use long sit and manipulate L LE with KI on off the bed  Transfers Overall transfer level: Needs assistance Equipment used: Rolling walker (2 wheeled) Transfers: Sit to/from [facility] Sit to Stand: Min guard;Min assist Stand pivot transfers: Min guard;Min assist       General transfer comment: v/c's for hand placement, pt with KI on but noted L knee buckling, minA to help support trunk to off weight L LE to complete std pvt to [facility] then to chair  Ambulation/Gait         Gait velocity: slow Gait velocity interpretation: Below normal speed for age/gender General Gait Details: completed 2 std pvt tranfers, v/c's for sequencing steps and walker management  Stairs            Wheelchair Mobility     Modified Rankin (Stroke Patients Only)       Balance Overall balance assessment:  (requires RW for amb due to recent surgery)                                           Pertinent Vitals/Pain Pain Assessment: 0-10 Pain Score: 5  Pain Location: L knee Pain Descriptors / Indicators: Aching Pain Intervention(s): Monitored during session    Home Living Family/patient expects to be discharged to:: Private residence Living Arrangements: Spouse/significant other Available Help at Discharge: Family;Available 24 hours/day Type of Home: House Home Access: Stairs to enter Entrance Stairs-Rails: None Entrance Stairs-Number of Steps: 1 Home Layout: Two level Home Equipment: None      Prior Function Level of Independence: Independent               Hand Dominance   Dominant Hand: Right    Extremity/Trunk Assessment   Upper Extremity Assessment: Overall WFL for tasks assessed           Lower Extremity Assessment: LLE deficits/detail   LLE Deficits / Details: pt able to initiate quad set and achieve 45 deg of AAROM into flexion  Cervical / Trunk Assessment: Normal  Communication   Communication: No difficulties  Cognition Arousal/Alertness: Awake/alert Behavior During Therapy: WFL for tasks assessed/performed Overall Cognitive Status: Within Functional Limits for tasks assessed  General Comments General comments (skin integrity, edema, etc.): assist pt to bathroom, pt isupervision for hygiene, modA to don underwear and pad due to menstration    Exercises Total Joint Exercises Ankle Circles/Pumps: AROM;10 reps;Supine Quad Sets: AROM;Left;10 reps Heel Slides: AAROM;Left;10 reps      Assessment/Plan    PT Assessment Patient needs continued PT services  PT Diagnosis Difficulty walking;Acute pain   PT Problem List Decreased strength;Decreased range of motion;Decreased activity tolerance;Decreased balance;Decreased  mobility;Decreased knowledge of use of DME  PT Treatment Interventions DME instruction;Gait training;Stair training;Functional mobility training;Therapeutic activities;Therapeutic exercise   PT Goals (Current goals can be found in the Care Plan section) Acute Rehab PT Goals Patient Stated Goal: home PT Goal Formulation: With patient Time For Goal Achievement: 06/01/15 Potential to Achieve Goals: Good    Frequency 7X/week   Barriers to discharge Decreased caregiver support unsure how long pts spouse can take off work    Co-evaluation               End of Session Equipment Utilized During Treatment: Gait belt;Left knee immobilizer Activity Tolerance: Patient tolerated treatment well Patient left: in chair;with call bell/phone within reach Nurse Communication: Mobility status         Time: [id number] PT Time Calculation (min) (ACUTE ONLY): 28 min   Charges:   PT Evaluation $PT Eval Moderate Complexity: 1 Procedure PT Treatments $Therapeutic Activity: 8-22 mins   PT G CodesKingsley Callander 05/25/2015, 4:44 PM   Kittie Plater, PT, DPT Pager #: [phone] Office #: [phone]

## 2015-05-25 NOTE — Anesthesia Postprocedure Evaluation (Signed)
Anesthesia Post Note  Patient: Whitney Hill  Procedure(s) Performed: Procedure(s) (LRB): LEFT TOTAL KNEE ARTHROPLASTY (Left)  Patient location during evaluation: PACU Anesthesia Type: General and Regional Level of consciousness: awake and alert Pain management: pain level controlled Vital Signs Assessment: post-procedure vital signs reviewed and stable Respiratory status: spontaneous breathing, nonlabored ventilation, respiratory function stable and patient connected to nasal cannula oxygen Cardiovascular status: blood pressure returned to baseline and stable Postop Assessment: no signs of nausea or vomiting Anesthetic complications: no    Last Vitals:  Filed Vitals:   05/25/15 1145 05/25/15 1201  BP: 105/65 107/73  Pulse: 63 52  Temp: 36.7 C 36.8 C  Resp: 14 16    Last Pain:  Filed Vitals:   05/25/15 1204  PainSc: [place] 

## 2015-05-25 NOTE — Transfer of Care (Signed)
Immediate Anesthesia Transfer of Care Note  Patient: Whitney Hill  Procedure(s) Performed: Procedure(s): LEFT TOTAL KNEE ARTHROPLASTY (Left)  Patient Location: PACU  Anesthesia Type:General  Level of Consciousness: awake, alert , oriented and patient cooperative  Airway & Oxygen Therapy: Patient Spontanous Breathing and Patient connected to nasal cannula oxygen  Post-op Assessment: Report given to RN, Post -op Vital signs reviewed and stable and Patient moving all extremities  Post vital signs: Reviewed and stable  Last Vitals:  Filed Vitals:   05/25/15 0615  BP: 114/71  Temp: 36.7 C  Resp: 16    Last Pain: There were no vitals filed for this visit.       Complications: No apparent anesthesia complications

## 2015-05-26 ENCOUNTER — Encounter (HOSPITAL_COMMUNITY): Payer: Self-pay | Admitting: Orthopedic Surgery

## 2015-05-26 MED ORDER — APIXABAN 2.5 MG PO TABS: 2.5000 mg | ORAL_TABLET | Freq: Two times a day (BID) | ORAL | Status: DC

## 2015-05-26 MED ORDER — ONDANSETRON HCL 4 MG PO TABS: 4.0000 mg | ORAL_TABLET | Freq: Three times a day (TID) | ORAL | Status: DC | PRN

## 2015-05-26 MED ORDER — METHOCARBAMOL 500 MG PO TABS: 500.0000 mg | ORAL_TABLET | Freq: Four times a day (QID) | ORAL | Status: DC | PRN

## 2015-05-26 MED ORDER — OXYCODONE-ACETAMINOPHEN 5-325 MG PO TABS
1.0000 | ORAL_TABLET | ORAL | Status: DC | PRN
Start: 2015-05-26 — End: 2015-07-05

## 2015-05-26 NOTE — Discharge Summary (Signed)
Discharge Summary  Patient ID: Whitney Hill MRN: [id number] DOB/AGE: [birth year] 45 y.o.  Admit date: 05/25/2015 Discharge date: 06/08/2015  Admission Diagnoses:  Knee osteochondritis dessicans  Discharge Diagnoses:  Principal Problem:   Knee osteochondritis dessicans   Past Medical History  Diagnosis Date  . Gastric ulcer   . DVT (deep venous thrombosis) ([place])     following first knee surgery in 2011  . Hypothyroid   . Complication of anesthesia     "ITCHING TO FACE, BENADRYL HELP BUT DROPS BLOOD PRESSURE"  . Heterozygous factor V Leiden mutation ([place])   . Iron deficiency anemia     Surgeries: Procedure(s): LEFT Unicompartmental KNEE ARTHROPLASTY on 05/25/2015   Consultants (if any):  PT  Discharged Condition: Improved  Hospital Course: [name] is an 45 y.o. female who was admitted 05/25/2015 with a diagnosis of Knee osteochondritis dessicans and went to the operating room on 05/25/2015 and underwent the above named procedures.    She was given perioperative antibiotics:      Anti-infectives    Start     Dose/Rate Route Frequency Ordered Stop   05/25/15 1400  ceFAZolin (ANCEF) IVPB 2g/100 mL premix     2 g 200 mL/hr over 30 Minutes Intravenous Every 6 hours 05/25/15 1205 05/25/15 2045   05/25/15 0520  clindamycin (CLEOCIN) IVPB 900 mg     900 mg 100 mL/hr over 30 Minutes Intravenous On call to O.R. 05/25/15 [id number] 05/25/15 0805    .  She was given sequential compression devices, early ambulation, and Eliquis for DVT prophylaxis.  She benefited maximally from the hospital stay and there were no complications.    Recent vital signs:  Filed Vitals:   05/25/15 2357 06-08-2015 0500  BP: 107/66 119/61  Pulse: 81 76  Temp: 98.3 F (36.8 C) 98.6 F (37 C)  Resp: 16 16    Recent laboratory studies:  Lab Results  Component Value Date   HGB 13.5 05/14/2015   HGB 12.5 03/03/2015   HGB 9.6* 01/14/2015   Lab Results  Component Value Date   WBC 9.3  05/14/2015   PLT 312 05/14/2015   Lab Results  Component Value Date   INR 1.03 05/14/2015   Lab Results  Component Value Date   NA 140 05/14/2015   K 3.5 05/14/2015   CL 108 05/14/2015   CO2 22 05/14/2015   BUN 15 05/14/2015   CREATININE 0.80 05/14/2015   GLUCOSE 96 05/14/2015    Discharge Medications:     Medication List    ASK your doctor about these medications        B COMPLEX-B12 PO  Take 1 tablet by mouth daily.     B-12 DOTS SL  Place 1 tablet under the tongue daily.     multivitamin-iron-minerals-folic acid chewable tablet  Chew 1 tablet by mouth daily.     omeprazole 40 MG capsule  Commonly known as:  PRILOSEC  Take 1 capsule (40 mg total) by mouth daily.     Thyroid 97.5 MG Tabs  Take 1 tablet (97.5 mg total) by mouth daily.     Vitamin D (Ergocalciferol) 50000 units Caps capsule  Commonly known as:  DRISDOL  Take 1 capsule (50,000 Units total) by mouth every 7 (seven) days.        Diagnostic Studies: Dg Knee 1-2 Views Left  Jun 08, 2015  CLINICAL DATA:  Postop LEFT knee hemiarthroplasty EXAM: LEFT KNEE - 1-2 VIEW COMPARISON:  None FINDINGS: Components of a hemiarthroplasty prosthesis are present  at the medial compartment of the LEFT knee. Bones appear demineralized. No acute fracture, dislocation or bone destruction. Expected postsurgical changes of the anterior and medial soft tissues. IMPRESSION: Post LEFT knee hemiarthroplasty without acute abnormalities. Electronically Signed   By: Lavonia Dana M.D.   On: 05/26/2015 00:01    Disposition: Final discharge disposition not confirmed   Signed: Prudencio Burly III PA-C 05/26/2015, 7:39 AM

## 2015-05-26 NOTE — Care Management Note (Signed)
Case Management Note  Patient Details  Name: Dreamer Dulong MRN: [id number] Date of Birth: 06/14/1970  Subjective/Objective:                S/p left total arthroplasty    Action/Plan: Spoke with patient about discharge plan, gave choice for home health. Patient chose [facility]. Contacted Edwina at [facility] and set up [place]. [employer] has delivered 3N1 and rolling walker to patient's room and will deliver CPM to home. Patient stated that her husband will be assisting her after discharge.    Expected Discharge Date:                  Expected Discharge Plan:  [place]  In-House Referral:  NA  Discharge planning Services  CM Consult  Post Acute Care Choice:  Durable Medical Equipment, Home Health Choice offered to:  Patient  DME Arranged:  3-N-1, Walker rolling, CPM DME Agency:  [employer]  HH Arranged:  PT HH Agency:  [place]  Status of Service:  Completed, signed off  Medicare Important Message Given:    Date Medicare IM Given:    Medicare IM give by:    Date Additional Medicare IM Given:    Additional Medicare Important Message give by:     If discussed at [place] of Stay Meetings, dates discussed:    Additional Comments:  Nila Nephew, RN 05/26/2015, 11:53 AM

## 2015-05-26 NOTE — Progress Notes (Signed)
Physical Therapy Treatment Patient Details Name: Whitney Hill MRN: [id number] DOB: May 13, 1970 Today's Date: 05/26/2015    History of Present Illness Pt admitted 5/16 for elective L TKA. Pt with old L ankle surgery.    PT Comments    Pt progressing towards physical therapy goals. Was able to perform transfers and ambulation with min guard to supervision for safety. Pt completed stair training and was able to verbalize sequencing and precautions back to therapist. Pt anticipating d/c home this afternoon. Will continue to follow tomorrow if d/c is delayed.   Follow Up Recommendations  Home health PT;Supervision/Assistance - 24 hour     Equipment Recommendations  Rolling walker with 5" wheels;3in1 (PT)    Recommendations for Other Services       Precautions / Restrictions Precautions Precautions: Knee;Fall Precaution Booklet Issued: Yes (comment) Precaution Comments: Reinforced use of bone foam and NO pillow under knee.  Knee Immobilizer - Left: On when out of bed or walking Restrictions Weight Bearing Restrictions: Yes LLE Weight Bearing: Weight bearing as tolerated    Mobility  Bed Mobility               General bed mobility comments: Pt up in recliner upon PT arrival.   Transfers Overall transfer level: Needs assistance Equipment used: Rolling walker (2 wheeled) Transfers: Sit to/from Stand Sit to Stand: Min guard Stand pivot transfers: Supervision       General transfer comment: VC's for hand placement on seated surface for safety. Pt able to power-up to full standing position without assistance.   Ambulation/Gait Ambulation/Gait assistance: Supervision Ambulation Distance (Feet): 300 Feet Assistive device: Rolling walker (2 wheeled) Gait Pattern/deviations: Step-through pattern;Decreased stride length;Trunk flexed Gait velocity: Decreased Gait velocity interpretation: Below normal speed for age/gender General Gait Details: VC's for improved posture  and sequencing/safety with the RW.    Stairs Stairs: Yes Stairs assistance: Min guard Stair Management: One rail Right;Two rails;Step to pattern;Forwards Number of Stairs: 10 (5x2) General stair comments: VC's for sequencing and safety. Pt practiced with 1 rail and then 2 to simulate home environment  Wheelchair Mobility    Modified Rankin (Stroke Patients Only)       Balance Overall balance assessment: Needs assistance Sitting-balance support: Feet supported;No upper extremity supported Sitting balance-Leahy Scale: Fair     Standing balance support: No upper extremity supported;During functional activity Standing balance-Leahy Scale: Fair Standing balance comment: Pt able to stand statically without UE support prior to stand>sit                    Cognition Arousal/Alertness: Awake/alert Behavior During Therapy: WFL for tasks assessed/performed Overall Cognitive Status: Within Functional Limits for tasks assessed                      Exercises Total Joint Exercises Ankle Circles/Pumps: 10 reps Quad Sets: 10 reps Heel Slides: 5 reps Hip ABduction/ADduction: 10 reps    General Comments        Pertinent Vitals/Pain Pain Assessment: Faces Faces Pain Scale: Hurts little more Pain Location: knee Pain Descriptors / Indicators: Operative site guarding;Sore Pain Intervention(s): Limited activity within patient's tolerance;Monitored during session;Repositioned    Home Living Family/patient expects to be discharged to:: Private residence Living Arrangements: Spouse/significant other Available Help at Discharge: Family;Available 24 hours/day Type of Home: House Home Access: Stairs to enter Entrance Stairs-Rails: None Home Layout: Two level Home Equipment: None      Prior Function Level of Independence: Independent  PT Goals (current goals can now be found in the care plan section) Acute Rehab PT Goals Patient Stated Goal: home PT Goal  Formulation: With patient Time For Goal Achievement: 06/01/15 Potential to Achieve Goals: Good Progress towards PT goals: Progressing toward goals    Frequency  7X/week    PT Plan Current plan remains appropriate    Co-evaluation             End of Session Equipment Utilized During Treatment: Gait belt Activity Tolerance: Patient tolerated treatment well Patient left: in chair;with call bell/phone within reach     Time: 1340-1410 PT Time Calculation (min) (ACUTE ONLY): 30 min  Charges:  $Gait Training: 23-37 mins                    G Codes:      Whitney Hill 06-12-2015, 2:43 PM   Whitney Hill, PT, DPT Acute Rehabilitation Services Pager: [phone]

## 2015-05-26 NOTE — Progress Notes (Signed)
Orthopedic Tech Progress Note Patient Details:  Whitney Hill 07/24/1970 [id number]  Patient ID: Whitney Hill, female   DOB: Nov 04, 1970, 45 y.o.   MRN: [id number] Applied cpm 0-60  Whitney Hill 05/26/2015, 5:55 AM

## 2015-05-26 NOTE — Progress Notes (Signed)
Physical Therapy Treatment Patient Details Name: Whitney Hill MRN: [id number] DOB: [birth year] Today's Date: 05/26/2015    History of Present Illness Pt admitted 5/16 for elective L TKA. Pt with old L ankle surgery.    PT Comments    Pt progressing towards physical therapy goals. Was able to perform transfers and ambulation with min guard to supervision for safety. Pt limited at end of session due to vomiting, but was motivated to complete a few exercises. Will continue to follow.   Follow Up Recommendations  Home health PT;Supervision/Assistance - 24 hour     Equipment Recommendations  Rolling walker with 5" wheels;3in1 (PT)    Recommendations for Other Services       Precautions / Restrictions Precautions Precautions: Knee;Fall Precaution Booklet Issued: Yes (comment) Precaution Comments: Reinforced use of bone foam and NO pillow under knee.  Restrictions Weight Bearing Restrictions: Yes LLE Weight Bearing: Weight bearing as tolerated    Mobility  Bed Mobility Overal bed mobility: Needs Assistance Bed Mobility: Supine to Sit     Supine to sit: Supervision     General bed mobility comments: Supervision for safety without knee immobilizer. Pt able to maneuver surgical leg off EOB however decreased control noted.   Transfers Overall transfer level: Needs assistance Equipment used: Rolling walker (2 wheeled) Transfers: Sit to/from Stand Sit to Stand: Min guard         General transfer comment: VC's for hand placement on seated surface for safety. Pt able to power-up to full standing position without assistance.   Ambulation/Gait Ambulation/Gait assistance: Min guard Ambulation Distance (Feet): 150 Feet Assistive device: Rolling walker (2 wheeled) Gait Pattern/deviations: Step-through pattern;Decreased stride length;Trunk flexed Gait velocity: Decreased Gait velocity interpretation: Below normal speed for age/gender General Gait Details: VC's for improved  posture and sequencing/safety with the RW.    Stairs            Wheelchair Mobility    Modified Rankin (Stroke Patients Only)       Balance Overall balance assessment: Needs assistance Sitting-balance support: Feet supported;No upper extremity supported Sitting balance-Leahy Scale: Fair     Standing balance support: Bilateral upper extremity supported;During functional activity Standing balance-Leahy Scale: Poor Standing balance comment: Relies on UE support on walker for balance at this time.                     Cognition Arousal/Alertness: Awake/alert Behavior During Therapy: WFL for tasks assessed/performed Overall Cognitive Status: Within Functional Limits for tasks assessed                      Exercises Total Joint Exercises Ankle Circles/Pumps: 10 reps Quad Sets: 10 reps Heel Slides: 5 reps Hip ABduction/ADduction: 10 reps Goniometric ROM: 68    General Comments        Pertinent Vitals/Pain Pain Assessment: Faces Faces Pain Scale: Hurts little more Pain Location: Knee Pain Descriptors / Indicators: Operative site guarding;Sore Pain Intervention(s): Limited activity within patient's tolerance;Monitored during session;Repositioned    Home Living                      Prior Function            PT Goals (current goals can now be found in the care plan section) Acute Rehab PT Goals Patient Stated Goal: home PT Goal Formulation: With patient Time For Goal Achievement: 06/01/15 Potential to Achieve Goals: Good Progress towards PT goals: Progressing toward goals  Frequency  7X/week    PT Plan Current plan remains appropriate    Co-evaluation             End of Session Equipment Utilized During Treatment: Gait belt Activity Tolerance: Treatment limited secondary to medical complications (Comment) (Nausea/Vomiting) Patient left: in chair;with call bell/phone within reach     Time: 0807-0847 PT Time  Calculation (min) (ACUTE ONLY): 40 min  Charges:  $Gait Training: 23-37 mins $Therapeutic Exercise: 8-22 mins                    G Codes:      Rolinda Roan 2015-05-31, 10:42 AM   Rolinda Roan, PT, DPT Acute Rehabilitation Services Pager: [phone]

## 2015-05-26 NOTE — Discharge Instructions (Signed)
INSTRUCTIONS AFTER JOINT REPLACEMENT   o Remove items at home which could result in a fall. This includes throw rugs or furniture in walking pathways o ICE to the affected joint every three hours while awake for 30 minutes at a time, for at least the first 3-5 days, and then as needed for pain and swelling.  Continue to use ice for pain and swelling. You may notice swelling that will progress down to the foot and ankle.  This is normal after surgery.  Elevate your leg when you are not up walking on it.   o Continue to use the breathing machine you got in the hospital (incentive spirometer) which will help keep your temperature down.  It is common for your temperature to cycle up and down following surgery, especially at night when you are not up moving around and exerting yourself.  The breathing machine keeps your lungs expanded and your temperature down.   DIET:  As you were doing prior to hospitalization, we recommend a well-balanced diet.  DRESSING / WOUND CARE / SHOWERING  Keep the surgical dressing until follow up.  You may remove Ace wrap or loosen if tight.  IF THE DRESSING FALLS OFF or the wound gets wet inside, change the dressing with sterile gauze.  Please use good hand washing techniques before changing the dressing.  Do not use any lotions or creams on the incision until instructed by your surgeon.    ACTIVITY  o Increase activity slowly as tolerated, but follow the weight bearing instructions below.   o No driving for 6 weeks or until further direction given by your physician.  You cannot drive while taking narcotics.  o No lifting or carrying greater than 10 lbs. until further directed by your surgeon. o Avoid periods of inactivity such as sitting longer than an hour when not asleep. This helps prevent blood clots.  o You may return to work once you are authorized by your doctor.     WEIGHT BEARING   Weight bearing as tolerated with assist device (walker, cane, etc) as  directed, use it as long as suggested by your surgeon or therapist, typically at least 4-6 weeks.   EXERCISES  Results after joint replacement surgery are often greatly improved when you follow the exercise, range of motion and muscle strengthening exercises prescribed by your doctor. Safety measures are also important to protect the joint from further injury. Any time any of these exercises cause you to have increased pain or swelling, decrease what you are doing until you are comfortable again and then slowly increase them. If you have problems or questions, call your caregiver or physical therapist for advice.   Rehabilitation is important following a joint replacement. After just a few days of immobilization, the muscles of the leg can become weakened and shrink (atrophy).  These exercises are designed to build up the tone and strength of the thigh and leg muscles and to improve motion. Often times heat used for twenty to thirty minutes before working out will loosen up your tissues and help with improving the range of motion but do not use heat for the first two weeks following surgery (sometimes heat can increase post-operative swelling).   These exercises can be done on a training (exercise) mat, on the floor, on a table or on a bed. Use whatever works the best and is most comfortable for you.    Use music or television while you are exercising so that the exercises are a  pleasant break in your day. This will make your life better with the exercises acting as a break in your routine that you can look forward to.   Perform all exercises about fifteen times, three times per day or as directed.  You should exercise both the operative leg and the other leg as well.  Exercises include:    Quad Sets - Tighten up the muscle on the front of the thigh (Quad) and hold for 5-10 seconds.    Straight Leg Raises - With your knee straight (if you were given a brace, keep it on), lift the leg to 60 degrees,  hold for 3 seconds, and slowly lower the leg.  Perform this exercise against resistance later as your leg gets stronger.   Leg Slides: Lying on your back, slowly slide your foot toward your buttocks, bending your knee up off the floor (only go as far as is comfortable). Then slowly slide your foot back down until your leg is flat on the floor again.   Angel Wings: Lying on your back spread your legs to the side as far apart as you can without causing discomfort.   Hamstring Strength:  Lying on your back, push your heel against the floor with your leg straight by tightening up the muscles of your buttocks.  Repeat, but this time bend your knee to a comfortable angle, and push your heel against the floor.  You may put a pillow under the heel to make it more comfortable if necessary.   A rehabilitation program following joint replacement surgery can speed recovery and prevent re-injury in the future due to weakened muscles. Contact your doctor or a physical therapist for more information on knee rehabilitation.    CONSTIPATION  Constipation is defined medically as fewer than three stools per week and severe constipation as less than one stool per week.  Even if you have a regular bowel pattern at home, your normal regimen is likely to be disrupted due to multiple reasons following surgery.  Combination of anesthesia, postoperative narcotics, change in appetite and fluid intake all can affect your bowels.   YOU MUST use at least one of the following options; they are listed in order of increasing strength to get the job done.  They are all available over the counter, and you may need to use some, POSSIBLY even all of these options:    Drink plenty of fluids (prune juice may be helpful) and high fiber foods Colace 100 mg by mouth twice a day  Senokot for constipation as directed and as needed Dulcolax (bisacodyl), take with full glass of water  Miralax (polyethylene glycol) once or twice a day as  needed.  If you have tried all these things and are unable to have a bowel movement in the first 3-4 days after surgery call either your surgeon or your primary doctor.    If you experience loose stools or diarrhea, hold the medications until you stool forms back up.  If your symptoms do not get better within 1 week or if they get worse, check with your doctor.  If you experience "the worst abdominal pain ever" or develop nausea or vomiting, please contact the office immediately for further recommendations for treatment.   ITCHING:  If you experience itching with your medications, try taking only a single pain pill, or even half a pain pill at a time.  You can also use Benadryl over the counter for itching or also to help with sleep.  TED HOSE STOCKINGS:  Use stockings on both legs until for at least 2 weeks or as directed by physician office. They may be removed at night for sleeping.  MEDICATIONS:  See your medication summary on the After Visit Summary that nursing will review with you.  You may have some home medications which will be placed on hold until you complete the course of blood thinner medication.  It is important for you to complete the blood thinner medication as prescribed.  PRECAUTIONS:  If you experience chest pain or shortness of breath - call 911 immediately for transfer to the hospital emergency department.   If you develop a fever greater that 101 F, purulent drainage from wound, increased redness or drainage from wound, foul odor from the wound/dressing, or calf pain - CONTACT YOUR SURGEON.                                                   FOLLOW-UP APPOINTMENTS:  If you do not already have a post-op appointment, please call the office for an appointment to be seen by your surgeon.  Guidelines for how soon to be seen are listed in your After Visit Summary, but are typically between 1-4 weeks after surgery.  OTHER INSTRUCTIONS:   Knee Replacement:  Do not place pillow  under knee, focus on keeping the knee straight while resting. CPM instructions: 0-90 degrees, 2 hours in the morning, 2 hours in the afternoon, and 2 hours in the evening. Place foam block, curve side up under heel at all times except when in CPM or when walking.  DO NOT modify, tear, cut, or change the foam block in any way.  MAKE SURE YOU:   Understand these instructions.   Get help right away if you are not doing well or get worse.    Thank you for letting us be a part of your medical care team.  It is a privilege we respect greatly.  We hope these instructions will help you stay on track for a fast and full recovery!   Information on my medicine - ELIQUIS (apixaban)  This medication education was reviewed with me or my healthcare representative as part of my discharge preparation.  The pharmacist that spoke with me during my hospital stay was:  Duayne Cal, [facility]  Why was Eliquis prescribed for you? Eliquis was prescribed for you to reduce the risk of blood clots forming after orthopedic surgery.    What do You need to know about Eliquis? Take your Eliquis TWICE DAILY - one tablet in the morning and one tablet in the evening with or without food.  It would be best to take the dose about the same time each day.  If you have difficulty swallowing the tablet whole please discuss with your pharmacist how to take the medication safely.  Take Eliquis exactly as prescribed by your doctor and DO NOT stop taking Eliquis without talking to the doctor who prescribed the medication.  Stopping without other medication to take the place of Eliquis may increase your risk of developing a clot.  After discharge, you should have regular check-up appointments with your healthcare provider that is prescribing your Eliquis.  What do you do if you miss a dose? If a dose of ELIQUIS is not taken at the scheduled time, take it as soon as possible  on the same day and twice-daily administration  should be resumed.  The dose should not be doubled to make up for a missed dose.  Do not take more than one tablet of ELIQUIS at the same time.  Important Safety Information A possible side effect of Eliquis is bleeding. You should call your healthcare provider right away if you experience any of the following: ? Bleeding from an injury or your nose that does not stop. ? Unusual colored urine (red or dark brown) or unusual colored stools (red or black). ? Unusual bruising for unknown reasons. ? A serious fall or if you hit your head (even if there is no bleeding).  Some medicines may interact with Eliquis and might increase your risk of bleeding or clotting while on Eliquis. To help avoid this, consult your healthcare provider or pharmacist prior to using any new prescription or non-prescription medications, including herbals, vitamins, non-steroidal anti-inflammatory drugs (NSAIDs) and supplements.  This website has more information on Eliquis (apixaban): http://[email or web address].com/eliquis/home

## 2015-05-26 NOTE — Progress Notes (Signed)
Occupational Therapy Evaluation Patient Details Name: Whitney Hill MRN: [id number] DOB: [birth year] Today's Date: 05/26/2015    History of Present Illness Pt admitted 5/16 for elective L TKA. Pt with old L ankle surgery.   Clinical Impression   Completed all education regarding compensatory techniques and functional mobility to maximize independence with ADL. Pt able to return demonstrate. Pt ready to D/C home with intermittent S when medically stable.     Follow Up Recommendations  No OT follow up;Supervision - Intermittent    Equipment Recommendations  3 in 1 bedside comode    Recommendations for Other Services       Precautions / Restrictions Precautions Precautions: Knee Precaution Booklet Issued: Yes (comment) Precaution Comments: Reinforced use of bone foam and NO pillow under knee.  Knee Immobilizer - LLE Weight Bearing: Weight bearing as tolerated      Mobility Bed Mobility Overal bed mobility: Needs Assistance Bed Mobility: Supine to Sit     Supine to sit: Supervision     General bed mobility comments: Pt OOB in chair  Transfers Overall transfer level: Needs assistance Equipment used: Rolling walker (2 wheeled) Transfers: Sit to/from Stand Sit to Stand: Supervision Stand pivot transfers: Supervision       General transfer comment: good hand pplacement adn carry over from earlier PT session    Balance Overall balance assessment: Needs assistance Sitting-balance support: Feet supported;No upper extremity supported Sitting balance-Leahy Scale: Good     Standing balance support: Bilateral upper extremity supported;During functional activity Standing balance-Leahy Scale: Fair Standing balance comment: Relies on UE support on walker for balance at this time.                             ADL Overall ADL's : Needs assistance/impaired                         Toilet Transfer: Supervision/safety   Toileting- Marine scientist and Hygiene: Modified independent   Tub/ Banker: Supervision/safety;Rolling walker;3 in Theatre stage manager Details (indicate cue type and reason): completed education on shower transfer with RW and use of 3 in 1 Functional mobility during ADLs: Supervision/safety;Rolling walker;Cueing for safety General ADL Comments: completed educaiton on compensatory techniques for LB ADL, home safety and reducing risk of falls. Pt verbalized understanding. Husband present for session.     Vision     Perception     Praxis      Pertinent Vitals/Pain Pain Assessment: Faces Faces Pain Scale: Hurts little more Pain Location: knee Pain Descriptors / Indicators: Aching;Operative site guarding Pain Intervention(s): Limited activity within patient's tolerance;Repositioned     Hand Dominance Right   Extremity/Trunk Assessment Upper Extremity Assessment Upper Extremity Assessment: Overall WFL for tasks assessed   Lower Extremity Assessment Lower Extremity Assessment: Defer to PT evaluation Cervical / Trunk Assessment Cervical / Trunk Assessment: Normal   Communication Communication Communication: No difficulties   Cognition Arousal/Alertness: Awake/alert Behavior During Therapy: WFL for tasks assessed/performed Overall Cognitive Status: Within Functional Limits for tasks assessed                     General Comments       Exercises Exercises: Total Joint     Shoulder Instructions      Home Living Family/patient expects to be discharged to:: Private residence Living Arrangements: Spouse/significant other Available Help at Discharge: Family;Available 24 hours/day Type of Home: House Home Access: Stairs  to enter Entrance Stairs-Number of Steps: 1 Entrance Stairs-Rails: None Home Layout: Two level Alternate Level Stairs-Number of Steps: 12 Alternate Level Stairs-Rails: Right Bathroom Shower/Tub: Occupational psychologist: Standard Bathroom  Accessibility: Yes How Accessible: Accessible via walker Home Equipment: None          Prior Functioning/Environment Level of Independence: Independent             OT Diagnosis: Generalized weakness;Acute pain   OT Problem List: Decreased strength;Decreased range of motion;Decreased knowledge of use of DME or AE;Obesity;Pain   OT Treatment/Interventions:      OT Goals(Current goals can be found in the care plan section) Acute Rehab OT Goals Patient Stated Goal: home OT Goal Formulation: All assessment and education complete, DC therapy  OT Frequency:     Barriers to D/C:            Co-evaluation              End of Session Equipment Utilized During Treatment: Rolling walker CPM Left Knee CPM Left Knee: Off Nurse Communication: Mobility status  Activity Tolerance: Patient tolerated treatment well Patient left: in chair;with call bell/phone within reach;with family/visitor present   Time: 1405-1420 OT Time Calculation (min): 15 min Charges:  OT General Charges $OT Visit: 1 Procedure OT Evaluation $OT Eval Low Complexity: 1 Procedure G-Codes:    ,HILLARY 06-09-2015, 2:27 PM   [facility], OTR/L  [id number] 06/09/15 [place], OTR/L  [id number] 09-Jun-2015

## 2015-06-08 ENCOUNTER — Encounter: Payer: Self-pay | Admitting: Family Medicine

## 2015-06-08 MED ORDER — VITAMIN D (ERGOCALCIFEROL) 1.25 MG (50000 UNIT) PO CAPS: 50000.0000 [IU] | ORAL_CAPSULE | ORAL | Status: DC

## 2015-06-28 ENCOUNTER — Inpatient Hospital Stay: Payer: Managed Care, Other (non HMO)

## 2015-06-29 ENCOUNTER — Inpatient Hospital Stay: Payer: Managed Care, Other (non HMO) | Attending: Internal Medicine

## 2015-06-29 DIAGNOSIS — Z86718 Personal history of other venous thrombosis and embolism: Secondary | ICD-10-CM | POA: Insufficient documentation

## 2015-06-29 DIAGNOSIS — D509 Iron deficiency anemia, unspecified: Secondary | ICD-10-CM | POA: Diagnosis not present

## 2015-06-29 DIAGNOSIS — D6851 Activated protein C resistance: Secondary | ICD-10-CM | POA: Insufficient documentation

## 2015-06-29 DIAGNOSIS — Z79899 Other long term (current) drug therapy: Secondary | ICD-10-CM | POA: Diagnosis not present

## 2015-06-29 DIAGNOSIS — Z9884 Bariatric surgery status: Secondary | ICD-10-CM | POA: Diagnosis not present

## 2015-06-29 DIAGNOSIS — E039 Hypothyroidism, unspecified: Secondary | ICD-10-CM | POA: Insufficient documentation

## 2015-06-29 DIAGNOSIS — M1990 Unspecified osteoarthritis, unspecified site: Secondary | ICD-10-CM | POA: Diagnosis not present

## 2015-06-29 DIAGNOSIS — I82402 Acute embolism and thrombosis of unspecified deep veins of left lower extremity: Secondary | ICD-10-CM

## 2015-06-29 LAB — IRON AND TIBC
IRON: 93 ug/dL (ref 28–170)
SATURATION RATIOS: 31 % (ref 10.4–31.8)
TIBC: 303 ug/dL (ref 250–450)
UIBC: 210 ug/dL

## 2015-06-29 LAB — CBC WITH DIFFERENTIAL/PLATELET
BASOS PCT: 0 %
Basophils Absolute: 0 10*3/uL (ref 0–0.1)
EOS ABS: 0.2 10*3/uL (ref 0–0.7)
EOS PCT: 2 %
HCT: 39.3 % (ref 35.0–47.0)
HEMOGLOBIN: 13.5 g/dL (ref 12.0–16.0)
Lymphocytes Relative: 28 %
Lymphs Abs: 2.2 10*3/uL (ref 1.0–3.6)
MCH: 33.2 pg (ref 26.0–34.0)
MCHC: 34.4 g/dL (ref 32.0–36.0)
MCV: 96.4 fL (ref 80.0–100.0)
Monocytes Absolute: 0.5 10*3/uL (ref 0.2–0.9)
Monocytes Relative: 7 %
NEUTROS PCT: 63 %
Neutro Abs: 4.9 10*3/uL (ref 1.4–6.5)
Platelets: 281 10*3/uL (ref 150–440)
RBC: 4.08 MIL/uL (ref 3.80–5.20)
RDW: 12.3 % (ref 11.5–14.5)
WBC: 7.9 10*3/uL (ref 3.6–11.0)

## 2015-06-29 LAB — FERRITIN: FERRITIN: 187 ng/mL (ref 11–307)

## 2015-07-01 ENCOUNTER — Telehealth: Payer: Self-pay | Admitting: *Deleted

## 2015-07-01 NOTE — Telephone Encounter (Signed)
-----   Message from Cammie Sickle, MD sent at 07/01/2015  7:30 AM EDT ----- Will NOT need IV iron- Please inform pt/follow up as planned. Thx

## 2015-07-01 NOTE — Telephone Encounter (Signed)
Called patient to inform her that she does not need IV iron.  Patient should follow up as planned.  Verbalized understanding.

## 2015-07-05 ENCOUNTER — Inpatient Hospital Stay (HOSPITAL_BASED_OUTPATIENT_CLINIC_OR_DEPARTMENT_OTHER): Payer: Managed Care, Other (non HMO) | Admitting: Oncology

## 2015-07-05 ENCOUNTER — Inpatient Hospital Stay: Payer: Managed Care, Other (non HMO)

## 2015-07-05 VITALS — BP 111/75 | HR 94 | Temp 98.5°F | Resp 18 | Wt 238.3 lb

## 2015-07-05 DIAGNOSIS — D509 Iron deficiency anemia, unspecified: Secondary | ICD-10-CM

## 2015-07-05 DIAGNOSIS — D6851 Activated protein C resistance: Secondary | ICD-10-CM | POA: Diagnosis not present

## 2015-07-05 DIAGNOSIS — Z79899 Other long term (current) drug therapy: Secondary | ICD-10-CM

## 2015-07-05 DIAGNOSIS — Z9884 Bariatric surgery status: Secondary | ICD-10-CM | POA: Diagnosis not present

## 2015-07-05 DIAGNOSIS — Z86718 Personal history of other venous thrombosis and embolism: Secondary | ICD-10-CM | POA: Diagnosis not present

## 2015-07-05 DIAGNOSIS — D649 Anemia, unspecified: Secondary | ICD-10-CM

## 2015-07-05 NOTE — Progress Notes (Signed)
[name]  [phone]  [phone]     Whitney Hill DOB: November 29, 1970  MR#: [id number]  [id number]  Patient Care Team: Leone Haven, MD as PCP - General (Family Medicine) Kennith Center, RD as Dietitian (Family Medicine)  CHIEF COMPLAINT:  Chief Complaint  Patient presents with  . IDA   # Iron deficiency Anemia [sec to gastric bypass]; FEB 2017- IV ferrahem x4   # Hx DVT of LLE [s/p s/p Surgery- 2011- 3 months anti-coag]; # Hx of factor V leiden [mom]  # 2012- Gastric by pass-  INTERVAL HISTORY: Patient returns to clinic today for evaluation and possible IV Ferriheme. She currently feels well and is asymptomatic. She was on Eliquis post left knee surgery but recently finished 30 days of therapy. She is positive for Factor V Leiden. She has follow up with knee surgeon tomorrow. She denies any pain. She denies any unusual bleeding. She has no complaints today.   REVIEW OF SYSTEMS:   Review of Systems  Constitutional: Negative.   HENT: Negative.   Eyes: Negative.   Respiratory: Negative.   Cardiovascular: Negative.   Gastrointestinal: Negative.   Genitourinary: Negative.   Musculoskeletal: Negative.   Skin: Negative.   Neurological: Negative.   Psychiatric/Behavioral: Negative.     As per HPI. Otherwise, a complete review of systems is negatve.  ONCOLOGY HISTORY:  No history exists.    PAST MEDICAL HISTORY: Past Medical History  Diagnosis Date  . Gastric ulcer   . DVT (deep venous thrombosis) ([place])     following first knee surgery in 2011  . Hypothyroid   . Complication of anesthesia     "ITCHING TO FACE, BENADRYL HELP BUT DROPS BLOOD PRESSURE"  . Heterozygous factor V Leiden mutation ([place])   . Iron deficiency anemia   . Headache   . Arthritis     OA    PAST SURGICAL HISTORY: Past Surgical History  Procedure Laterality Date  . Gastric bypass      2012, revision 2013  . Cholecystectomy      1998  .  Ankle fracture surgery      1994, 1996  . Dilation and curettage of uterus      1994, 2001  . Tubal ligation      2004  . Tooth extraction      2010  . Knee arthroscopy      2011, 2013, 2015  . Total knee arthroplasty Left 05/25/2015    Procedure: LEFT TOTAL KNEE ARTHROPLASTY;  Surgeon: Renette Butters, MD;  Location: [facility];  Service: Orthopedics;  Laterality: Left;    FAMILY HISTORY Family History  Problem Relation Age of Onset  . Arthritis      parent  . Breast cancer      aunt, grandmother  . Hyperlipidemia      parent  . Stroke      parent  . Hypertension      parent  . Kidney disease      parent, grandparent  . Diabetes      parent, grandparent  . Renal cancer      parent     ADVANCED DIRECTIVES:    HEALTH MAINTENANCE: Social History  Substance Use Topics  . Smoking status: Never Smoker   . Smokeless tobacco: Never Used  . Alcohol Use: No    Allergies  Allergen Reactions  . Paxil [Paroxetine Hcl] Anaphylaxis    Throat swelled shut  . Penicillins Hives    Current  Outpatient Prescriptions  Medication Sig Dispense Refill  . B Complex Vitamins (B COMPLEX-B12 PO) Take 1 tablet by mouth daily.     . Cyanocobalamin (B-12 DOTS SL) Place 1 tablet under the tongue daily.     Marland Kitchen FLECTOR 1.3 % PTCH APPLY 1 PATCH TWICE A DAY AS DIRECTED  6  . HYDROcodone-acetaminophen (NORCO/VICODIN) 5-325 MG tablet     . multivitamin-iron-minerals-folic acid (CENTRUM) chewable tablet Chew 1 tablet by mouth daily.    Marland Kitchen omeprazole (PRILOSEC) 40 MG capsule Take 1 capsule (40 mg total) by mouth daily. (Patient taking differently: Take 40 mg by mouth as needed. ) 90 capsule 3  . ondansetron (ZOFRAN) 4 MG tablet Take 1 tablet (4 mg total) by mouth every 8 (eight) hours as needed for nausea or vomiting. 40 tablet 0  . thyroid 97.5 MG TABS Take 1 tablet (97.5 mg total) by mouth daily. 90 tablet 1  . Vitamin D, Ergocalciferol, (DRISDOL) 50000 units CAPS capsule Take 1 capsule (50,000  Units total) by mouth every 7 (seven) days. 30 capsule 0   No current facility-administered medications for this visit.    OBJECTIVE: BP 111/75 mmHg  Pulse 94  Temp(Src) 98.5 F (36.9 C) (Tympanic)  Resp 18  Wt 238 lb 5.1 oz (108.1 kg)   Body mass index is 39.66 kg/(m^2).    ECOG FS:0 - Asymptomatic  GENERAL: Well-nourished well-developed; Alert, no distress and comfortable. Alone.  EYES: no pallor or icterus OROPHARYNX: no thrush or ulceration; good dentition  NECK: supple, no masses felt LUNGS: clear to auscultation and No wheeze or crackles HEART/CVS: regular rate & rhythm and no murmurs; No lower extremity edema ABDOMEN:abdomen soft, non-tender  Musculoskeletal:no cyanosis of digits and no clubbing  PSYCH: alert & oriented x 3 with fluent speech NEURO: no focal motor/sensory deficits SKIN: no rashes or significant lesions  LAB RESULTS:  No visits with results within 3 Day(s) from this visit. Latest known visit with results is:  Appointment on 06/29/2015  Component Date Value Ref Range Status  . WBC 06/29/2015 7.9  3.6 - 11.0 K/uL Final  . RBC 06/29/2015 4.08  3.80 - 5.20 MIL/uL Final  . Hemoglobin 06/29/2015 13.5  12.0 - 16.0 g/dL Final  . HCT 06/29/2015 39.3  35.0 - 47.0 % Final  . MCV 06/29/2015 96.4  80.0 - 100.0 fL Final  . MCH 06/29/2015 33.2  26.0 - 34.0 pg Final  . MCHC 06/29/2015 34.4  32.0 - 36.0 g/dL Final  . RDW 06/29/2015 12.3  11.5 - 14.5 % Final  . Platelets 06/29/2015 281  150 - 440 K/uL Final  . Neutrophils Relative % 06/29/2015 63   Final  . Neutro Abs 06/29/2015 4.9  1.4 - 6.5 K/uL Final  . Lymphocytes Relative 06/29/2015 28   Final  . Lymphs Abs 06/29/2015 2.2  1.0 - 3.6 K/uL Final  . Monocytes Relative 06/29/2015 7   Final  . Monocytes Absolute 06/29/2015 0.5  0.2 - 0.9 K/uL Final  . Eosinophils Relative 06/29/2015 2   Final  . Eosinophils Absolute 06/29/2015 0.2  0 - 0.7 K/uL Final  . Basophils Relative 06/29/2015 0   Final  .  Basophils Absolute 06/29/2015 0.0  0 - 0.1 K/uL Final  . Ferritin 06/29/2015 187  11 - 307 ng/mL Final  . Iron 06/29/2015 93  28 - 170 ug/dL Final  . TIBC 06/29/2015 303  250 - 450 ug/dL Final  . Saturation Ratios 06/29/2015 31  10.4 - 31.8 % Final  .  UIBC 06/29/2015 210   Final    STUDIES: No results found.  ASSESSMENT & PLAN:   1. Iron deficiency anemia sec to gastric bypass- Patient's hemoglobin is 13.5 today. Her iron studies are WNL. She does not need IV iron today. Follow up in 4 months with labs prior to visit.   2. Factor V Leiden heterozygosity; recommend- prophylactic anticoagulation after her orthopedic surgery; BUT NO long term anti-coagulation.  Patient expressed understanding and was in agreement with this plan. She also understands that She can call clinic at any time with any questions, concerns, or complaints.   Dr. Rogue Bussing was available for consultation and review of plan of care for this patient.   Mayra Reel, NP   07/05/2015 1:58 PM

## 2015-07-09 ENCOUNTER — Ambulatory Visit (INDEPENDENT_AMBULATORY_CARE_PROVIDER_SITE_OTHER): Payer: Managed Care, Other (non HMO) | Admitting: Family Medicine

## 2015-07-09 ENCOUNTER — Ambulatory Visit: Payer: Managed Care, Other (non HMO) | Admitting: Family Medicine

## 2015-07-09 ENCOUNTER — Encounter: Payer: Self-pay | Admitting: Family Medicine

## 2015-07-09 VITALS — BP 128/72 | HR 81 | Temp 98.6°F | Ht 65.63 in | Wt 240.0 lb

## 2015-07-09 DIAGNOSIS — Z96652 Presence of left artificial knee joint: Secondary | ICD-10-CM | POA: Diagnosis not present

## 2015-07-09 DIAGNOSIS — E669 Obesity, unspecified: Secondary | ICD-10-CM | POA: Diagnosis not present

## 2015-07-09 NOTE — Assessment & Plan Note (Signed)
Overall doing well. Minimal swelling. Tolerating physical therapy. She'll continue physical therapy and follow-up with her orthopedic surgeon.

## 2015-07-09 NOTE — Progress Notes (Signed)
Patient ID: Whitney Hill, female   DOB: 31-Dec-1970, 45 y.o.   MRN: [id number]  Tommi Rumps, MD Phone: [phone]  Whitney Hill is a 45 y.o. female who presents today for follow-up.  Patient underwent partial left knee replacement on the medial aspect recently. Has been going to outpatient PT and finds this helpful though somewhat uncomfortable. Just finished Eliquis. Has been taking some hydrocodone just before therapy now. Mild numbness over the lateral aspect of her knee since the surgery though this is improving. She is continuing to follow with orthopedic surgeon.  Obesity: No she is not going to go through bariatric surgery. She has been watching her diet. She notes her weight at home is down 7 pounds. She's cut cheese out completely. She has also stopped eating crackers. She's eating more protein. She plans to follow-up with the nutritionist. She plans start exercising once physical therapy releases her.  PMH: nonsmoker.   ROS see history of present illness  Objective  Physical Exam Filed Vitals:   07/09/15 1358  BP: 128/72  Pulse: 81  Temp: 98.6 F (37 C)    BP Readings from Last 3 Encounters:  07/09/15 128/72  07/05/15 111/75  05/26/15 119/61   Wt Readings from Last 3 Encounters:  07/09/15 240 lb (108.863 kg)  07/05/15 238 lb 5.1 oz (108.1 kg)  05/25/15 244 lb 4.8 oz (110.814 kg)    Physical Exam  Constitutional: No distress.  HENT:  Head: Normocephalic and atraumatic.  Cardiovascular: Normal rate, regular rhythm and normal heart sounds.   Pulmonary/Chest: Effort normal and breath sounds normal.  Musculoskeletal:  Left knee with mild swelling, scar over the midline of the knee, minimally tender over the soft tissues of the knee, no warmth or erythema, right knee with no tenderness or swelling  Neurological: She is alert.  Skin: Skin is warm and dry. She is not diaphoretic.     Assessment/Plan: Please see individual problem list.  Status post  left partial knee replacement, medial aspect Overall doing well. Minimal swelling. Tolerating physical therapy. She'll continue physical therapy and follow-up with her orthopedic surgeon.  Obesity Discussed diet and exercise at length today. She is down several pounds since her last visit. She is working on diet. She'll follow-up with the nutritionist for this. She is increasing her activity level as she is able to following her knee replacement. She is interested in weight loss medications and notes phentermine has not been helpful in the past. She was provided with information on Saxenda. Discussed that she would need to sustain effort for the next 1-2 months with little weight loss to consider medication.     Tommi Rumps, MD [place]

## 2015-07-09 NOTE — Patient Instructions (Signed)
Nice to see you. Please continue physical therapy for her knee. Please continue to work on diet and increasing your physical activity as physical therapy will allow. Please follow up with your nutritionist. We will plan on checking your thyroid function at your next visit.

## 2015-07-09 NOTE — Assessment & Plan Note (Signed)
Discussed diet and exercise at length today. She is down several pounds since her last visit. She is working on diet. She'll follow-up with the nutritionist for this. She is increasing her activity level as she is able to following her knee replacement. She is interested in weight loss medications and notes phentermine has not been helpful in the past. She was provided with information on Saxenda. Discussed that she would need to sustain effort for the next 1-2 months with little weight loss to consider medication.

## 2015-07-09 NOTE — Progress Notes (Signed)
Pre visit review using our clinic review tool, if applicable. No additional management support is needed unless otherwise documented below in the visit note. 

## 2015-07-23 ENCOUNTER — Encounter: Payer: Managed Care, Other (non HMO) | Admitting: Family Medicine

## 2015-08-13 ENCOUNTER — Ambulatory Visit (INDEPENDENT_AMBULATORY_CARE_PROVIDER_SITE_OTHER): Payer: Managed Care, Other (non HMO) | Admitting: Family Medicine

## 2015-08-13 ENCOUNTER — Telehealth: Payer: Self-pay | Admitting: *Deleted

## 2015-08-13 VITALS — BP 118/76 | HR 81 | Temp 98.1°F | Wt 239.4 lb

## 2015-08-13 DIAGNOSIS — Z96652 Presence of left artificial knee joint: Secondary | ICD-10-CM | POA: Diagnosis not present

## 2015-08-13 DIAGNOSIS — E039 Hypothyroidism, unspecified: Secondary | ICD-10-CM | POA: Diagnosis not present

## 2015-08-13 DIAGNOSIS — E669 Obesity, unspecified: Secondary | ICD-10-CM

## 2015-08-13 LAB — TSH: TSH: 1.84 u[IU]/mL (ref 0.35–4.50)

## 2015-08-13 LAB — T3, FREE: T3 FREE: 3.8 pg/mL (ref 2.3–4.2)

## 2015-08-13 LAB — T4, FREE: FREE T4: 0.53 ng/dL — AB (ref 0.60–1.60)

## 2015-08-13 MED ORDER — LIRAGLUTIDE -WEIGHT MANAGEMENT 18 MG/3ML ~~LOC~~ SOPN: 0.6000 mg | PEN_INJECTOR | Freq: Every day | SUBCUTANEOUS | 1 refills | Status: DC

## 2015-08-13 NOTE — Telephone Encounter (Signed)
Please give a time and date for pt to have a 1 month follow up 30 min

## 2015-08-13 NOTE — Assessment & Plan Note (Signed)
Check TSH and free T3 and free T4.

## 2015-08-13 NOTE — Progress Notes (Signed)
Tommi Rumps, MD Phone: [phone]  Whitney Hill is a 45 y.o. female who presents today for f/u.  HYPOTHYROIDISM Disease Monitoring Weight changes: no  Skin Changes: no Palpitations: no Heat/Cold intolerance: cold intolerance  Medication Monitoring Compliance:  Taking nature thyroid   Last TSH:   Lab Results  Component Value Date   TSH 6.38 (H) 01/14/2015   Obesity: Patient has tried to stay away from sugars and snacks. Weight has been stable since her last visit. Difficult for her to exercise an extensive amount given her recent knee replacement and Achilles tendon issues. Has been seeing nutrition intermittently. Plans to see them in follow-up. She is interested in trying [place]. Does report a niece who had a history of papillary thyroid cancer though no personal or family history of medullary thyroid cancer or MEN2 cancers.  Left knee replacement: Patient status post medial aspect partial knee replacement. Physical therapy has been helpful. Unable to fully extend her knee. Is seeing them for her left Achilles tendon as well.  PMH:  no personal or family history of medullary thyroid cancer or MEN2 cancers   ROS see history of present illness  Objective  Physical Exam Vitals:   08/13/15 1354  BP: 118/76  Pulse: 81  Temp: 98.1 F (36.7 C)    BP Readings from Last 3 Encounters:  08/13/15 118/76  07/09/15 128/72  07/05/15 111/75   Wt Readings from Last 3 Encounters:  08/13/15 239 lb 6.4 oz (108.6 kg)  07/09/15 240 lb (108.9 kg)  07/05/15 238 lb 5.1 oz (108.1 kg)    Physical Exam  Constitutional: No distress.  HENT:  Head: Normocephalic and atraumatic.  Cardiovascular: Normal rate, regular rhythm and normal heart sounds.   Pulmonary/Chest: Effort normal and breath sounds normal.  Musculoskeletal:  Bilateral knees with no tenderness or swelling, no ligamentous laxity, negative McMurray's Bilateral feet with no tenderness or swelling, Achilles with no  swelling or tenderness on the left  Neurological: She is alert. Gait normal.  Skin: Skin is warm and dry. She is not diaphoretic.     Assessment/Plan: Please see individual problem list.  Hypothyroidism Check TSH and free T3 and free T4.  Obesity Patient with trouble losing weight despite dietary changes. Difficulty exercising given her multiple orthopedic issues currently. We will trial Saxenda for weight loss. No family history of medullary thyroid cancer or MEN2 cancers. Discussed potential risk of medullary thyroid cancer with this medication. Discussed risk of hypoglycemia. Discussed symptoms of hypoglycemia. If develops these symptoms she will eat and she will let us know she developed them. We will see her back in a month for a weight check.  Status post left partial knee replacement, medial aspect Doing well status post partial knee replacement. She will continue physical therapy and continue to follow-up with her orthopedic surgeon.   Orders Placed This Encounter  Procedures  . TSH  . T4, free  . T3, free    Meds ordered this encounter  Medications  . Liraglutide -Weight Management (SAXENDA) 18 MG/3ML SOPN    Sig: Inject 0.6 mg into the skin daily. For one week. Week 2 dosing: inject 1.2 mg into the skin daily for 1 week. Week 3 dosing: Inject 1.8 mg into skin daily for one week. Week 4 dosing: Inject 2.4 mg into skin daily for one week. Week 5 and on dosing: Inject 3.0 mg into skin daily.    Dispense:  18 mL    Refill:  1    Tommi Rumps, MD  [place]

## 2015-08-13 NOTE — Progress Notes (Signed)
Pre visit review using our clinic review tool, if applicable. No additional management support is needed unless otherwise documented below in the visit note. 

## 2015-08-13 NOTE — Patient Instructions (Addendum)
Nice to see you. We will check your thyroid test. Please follow up with the nutritionist. Please continue to follow-up with the orthopedic surgeon regarding any. We will start you on Saxenda for weight loss. If you have issues with the cost please go to [email or web address] to look for a coupon. Please monitor for symptoms of low blood sugar such as sweatiness, jitteriness, shakiness, confusion. If these occur please eat something and let us know.

## 2015-08-13 NOTE — Assessment & Plan Note (Signed)
Doing well status post partial knee replacement. She will continue physical therapy and continue to follow-up with her orthopedic surgeon.

## 2015-08-13 NOTE — Assessment & Plan Note (Signed)
Patient with trouble losing weight despite dietary changes. Difficulty exercising given her multiple orthopedic issues currently. We will trial Saxenda for weight loss. No family history of medullary thyroid cancer or MEN2 cancers. Discussed potential risk of medullary thyroid cancer with this medication. Discussed risk of hypoglycemia. Discussed symptoms of hypoglycemia. If develops these symptoms she will eat and she will let us know she developed them. We will see her back in a month for a weight check.

## 2015-08-16 ENCOUNTER — Encounter: Payer: Self-pay | Admitting: Family Medicine

## 2015-08-16 NOTE — Telephone Encounter (Signed)
Scheduled patient for 09/20/15

## 2015-08-19 ENCOUNTER — Other Ambulatory Visit: Payer: Self-pay

## 2015-08-19 ENCOUNTER — Other Ambulatory Visit: Payer: Self-pay | Admitting: Family Medicine

## 2015-08-19 MED ORDER — INSULIN PEN NEEDLE 29G X 12.7MM MISC: 1 refills | Status: DC

## 2015-08-19 MED ORDER — THYROID 97.5 MG PO TABS: 97.5000 mg | ORAL_TABLET | Freq: Every day | ORAL | 1 refills | Status: DC

## 2015-08-30 ENCOUNTER — Encounter: Payer: Self-pay | Admitting: Family Medicine

## 2015-08-31 ENCOUNTER — Other Ambulatory Visit: Payer: Self-pay | Admitting: Family Medicine

## 2015-08-31 MED ORDER — ONDANSETRON HCL 4 MG PO TABS: 4.0000 mg | ORAL_TABLET | Freq: Three times a day (TID) | ORAL | 0 refills | Status: DC | PRN

## 2015-09-20 ENCOUNTER — Encounter: Payer: Self-pay | Admitting: Family Medicine

## 2015-09-20 ENCOUNTER — Ambulatory Visit (INDEPENDENT_AMBULATORY_CARE_PROVIDER_SITE_OTHER): Payer: Managed Care, Other (non HMO) | Admitting: Family Medicine

## 2015-09-20 VITALS — BP 114/68 | HR 75 | Temp 98.2°F | Wt 228.6 lb

## 2015-09-20 DIAGNOSIS — E039 Hypothyroidism, unspecified: Secondary | ICD-10-CM | POA: Diagnosis not present

## 2015-09-20 DIAGNOSIS — Z23 Encounter for immunization: Secondary | ICD-10-CM

## 2015-09-20 DIAGNOSIS — R229 Localized swelling, mass and lump, unspecified: Secondary | ICD-10-CM

## 2015-09-20 DIAGNOSIS — E669 Obesity, unspecified: Secondary | ICD-10-CM | POA: Diagnosis not present

## 2015-09-20 DIAGNOSIS — Z9884 Bariatric surgery status: Secondary | ICD-10-CM

## 2015-09-20 MED ORDER — ONDANSETRON HCL 4 MG PO TABS: 4.0000 mg | ORAL_TABLET | Freq: Three times a day (TID) | ORAL | 0 refills | Status: DC | PRN

## 2015-09-20 MED ORDER — LIRAGLUTIDE -WEIGHT MANAGEMENT 18 MG/3ML ~~LOC~~ SOPN: 3.0000 mg | PEN_INJECTOR | Freq: Every day | SUBCUTANEOUS | 1 refills | Status: DC

## 2015-09-20 NOTE — Assessment & Plan Note (Signed)
Doing well on Saxenda. Some nausea with this. We'll refill her Zofran. Discussed that if nausea becomes prohibitive we can discontinue the medication. Advised to monitor for symptoms of hypoglycemia.

## 2015-09-20 NOTE — Progress Notes (Signed)
Tommi Rumps, MD Phone: [phone]  Whitney Hill is a 45 y.o. female who presents today for follow-up.  Obesity: Patient has been on Saxenda for weight loss over the last month. Has had some decreased appetite with this. Also has made her want to eat more fruits. Still eating salads with meat. Has had some nausea with this though this gets better if she eats. She has not had any shakiness, sweatiness, tremors, or signs or symptoms of hypoglycemia. She's had no vomiting or abdominal pain with the nausea. She is getting exercise through physical therapy following her knee replacement.  Status post gastric bypass surgery: Patient has not had lab work in some time. She is not currently following with a Psychologist, sport and exercise. Needs lab work ordered.  Patient additionally requests labs to be ordered regarding Hashimoto's thyroid disease. Notes in the past she was told she was borderline for this. She is currently taking nature thyroid.  Patient has noted a small skin nodule on her left lower outer leg. Has been there 6-9 months. Is nontender. Has not grown.  PMH: nonsmoker.   ROS see history of present illness  Objective  Physical Exam Vitals:   09/20/15 1506  BP: 114/68  Pulse: 75  Temp: 98.2 F (36.8 C)    BP Readings from Last 3 Encounters:  09/20/15 114/68  08/13/15 118/76  07/09/15 128/72   Wt Readings from Last 3 Encounters:  09/20/15 228 lb 9.6 oz (103.7 kg)  08/13/15 239 lb 6.4 oz (108.6 kg)  07/09/15 240 lb (108.9 kg)    Physical Exam  Constitutional: No distress.  HENT:  Head: Normocephalic and atraumatic.  Cardiovascular: Normal rate, regular rhythm and normal heart sounds.   Pulmonary/Chest: Effort normal and breath sounds normal.  Abdominal: Bowel sounds are normal. She exhibits no distension. There is no tenderness. There is no rebound and no guarding.  Musculoskeletal: She exhibits no edema.  Neurological: She is alert.  Skin: Skin is warm and dry. She is not  diaphoretic.        Assessment/Plan: Please see individual problem list.  Hypothyroidism Check TSH and anti TPO.  Obesity Doing well on Saxenda. Some nausea with this. We'll refill her Zofran. Discussed that if nausea becomes prohibitive we can discontinue the medication. Advised to monitor for symptoms of hypoglycemia.  Skin nodule Suspect fibroma. We'll refer to dermatology for further evaluation.  S/P gastric bypass Needs labs collected. These will need to be fasting. Orders will be placed.   Orders Placed This Encounter  Procedures  . Flu Vaccine QUAD 36+ mos IM  . TSH    Standing Status:   Future    Standing Expiration Date:   09/19/2016  . Thyroid peroxidase antibody    Standing Status:   Future    Standing Expiration Date:   09/19/2016  . CBC    Standing Status:   Future    Standing Expiration Date:   09/19/2016  . Comp Met (CMET)    Standing Status:   Future    Standing Expiration Date:   09/19/2016  . PTH, intact (no Ca)    Standing Status:   Future    Standing Expiration Date:   09/19/2016  . B12    Standing Status:   Future    Standing Expiration Date:   09/19/2016  . Iron and TIBC    Standing Status:   Future    Standing Expiration Date:   09/19/2016  . Lipid Profile    Standing Status:   Future  Standing Expiration Date:   09/19/2016  . Folate    Standing Status:   Future    Standing Expiration Date:   09/19/2016  . VITAMIN D 25 Hydroxy (Vit-D Deficiency, Fractures)    Standing Status:   Future    Standing Expiration Date:   09/19/2016  . Copper, serum    Standing Status:   Future    Standing Expiration Date:   09/19/2016  . Vitamin B1    Standing Status:   Future    Standing Expiration Date:   09/19/2016  . Ambulatory referral to Dermatology    Referral Priority:   Routine    Referral Type:   Consultation    Referral Reason:   Specialty Services Required    Requested Specialty:   Dermatology    Number of Visits Requested:   1    Meds ordered  this encounter  Medications  . ondansetron (ZOFRAN) 4 MG tablet    Sig: Take 1 tablet (4 mg total) by mouth every 8 (eight) hours as needed for nausea or vomiting.    Dispense:  20 tablet    Refill:  0  . Liraglutide -Weight Management (SAXENDA) 18 MG/3ML SOPN    Sig: Inject 3 mg into the skin daily.    Dispense:  18 mL    Refill:  [place], MD [place]

## 2015-09-20 NOTE — Assessment & Plan Note (Signed)
Suspect fibroma. We'll refer to dermatology for further evaluation.

## 2015-09-20 NOTE — Progress Notes (Signed)
Pre visit review using our clinic review tool, if applicable. No additional management support is needed unless otherwise documented below in the visit note. 

## 2015-09-20 NOTE — Assessment & Plan Note (Signed)
Needs labs collected. These will need to be fasting. Orders will be placed.

## 2015-09-20 NOTE — Patient Instructions (Signed)
Nice to see you. Congratulations on weight loss. We will continue your Saxenda. You can try the Zofran for nausea. The nausea becomes too much please let us know and we will need to discontinue the medication. We will check labs in a future date when you are fasting. If you develop shakiness, sweatiness, tremor, persistent nausea, abdominal pain, vomiting, or any new or changing symptoms please seek medical attention.

## 2015-09-20 NOTE — Assessment & Plan Note (Signed)
Check TSH and anti TPO.

## 2015-09-23 ENCOUNTER — Other Ambulatory Visit (INDEPENDENT_AMBULATORY_CARE_PROVIDER_SITE_OTHER): Payer: Managed Care, Other (non HMO)

## 2015-09-23 DIAGNOSIS — Z9884 Bariatric surgery status: Secondary | ICD-10-CM

## 2015-09-23 DIAGNOSIS — E039 Hypothyroidism, unspecified: Secondary | ICD-10-CM

## 2015-09-23 LAB — LIPID PANEL
CHOL/HDL RATIO: 3
Cholesterol: 151 mg/dL (ref 0–200)
HDL: 44.4 mg/dL (ref >39.00)
LDL CALC: 74 mg/dL (ref 0–99)
NONHDL: 106.77
Triglycerides: 166 mg/dL — ABNORMAL HIGH (ref 0.0–149.0)
VLDL: 33.2 mg/dL (ref 0.0–40.0)

## 2015-09-23 LAB — CBC
HEMATOCRIT: 40.7 % (ref 36.0–46.0)
HEMOGLOBIN: 13.7 g/dL (ref 12.0–15.0)
MCHC: 33.7 g/dL (ref 30.0–36.0)
MCV: 96.6 fl (ref 78.0–100.0)
Platelets: 292 10*3/uL (ref 150.0–400.0)
RBC: 4.22 Mil/uL (ref 3.87–5.11)
RDW: 12.7 % (ref 11.5–15.5)
WBC: 6.5 10*3/uL (ref 4.0–10.5)

## 2015-09-23 LAB — COMPREHENSIVE METABOLIC PANEL
ALT: 8 U/L (ref 0–35)
AST: 11 U/L (ref 0–37)
Albumin: 4.2 g/dL (ref 3.5–5.2)
Alkaline Phosphatase: 75 U/L (ref 39–117)
BUN: 10 mg/dL (ref 6–23)
CHLORIDE: 106 meq/L (ref 96–112)
CO2: 27 mEq/L (ref 19–32)
CREATININE: 0.73 mg/dL (ref 0.40–1.20)
Calcium: 9.2 mg/dL (ref 8.4–10.5)
GFR: 90.63 mL/min (ref >60.00)
Glucose, Bld: 93 mg/dL (ref 70–99)
POTASSIUM: 3.5 meq/L (ref 3.5–5.1)
SODIUM: 139 meq/L (ref 135–145)
Total Bilirubin: 0.3 mg/dL (ref 0.2–1.2)
Total Protein: 7.2 g/dL (ref 6.0–8.3)

## 2015-09-23 LAB — TSH: TSH: 3.22 u[IU]/mL (ref 0.35–4.50)

## 2015-09-23 LAB — VITAMIN D 25 HYDROXY (VIT D DEFICIENCY, FRACTURES): VITD: 22.62 ng/mL — ABNORMAL LOW (ref 30.00–100.00)

## 2015-09-23 LAB — VITAMIN B12: VITAMIN B 12: 365 pg/mL (ref 211–911)

## 2015-09-23 LAB — FOLATE: FOLATE: 10.2 ng/mL (ref >5.9)

## 2015-09-23 NOTE — Addendum Note (Signed)
Addended by: Frutoso Chase A on: 09/23/2015 10:42 AM   Modules accepted: Orders

## 2015-09-24 LAB — IRON AND TIBC
%SAT: 25 % (ref 11–50)
Iron: 68 ug/dL (ref 40–190)
TIBC: 276 ug/dL (ref 250–450)
UIBC: 208 ug/dL (ref 125–400)

## 2015-09-24 LAB — THYROID PEROXIDASE ANTIBODY: THYROID PEROXIDASE ANTIBODY: 23 [IU]/mL — AB (ref <9)

## 2015-09-24 LAB — PARATHYROID HORMONE, INTACT (NO CA): PTH: 84 pg/mL — ABNORMAL HIGH (ref 14–64)

## 2015-09-27 LAB — VITAMIN B1: Vitamin B1 (Thiamine): 10 nmol/L (ref 8–30)

## 2015-09-27 LAB — CERULOPLASMIN: CERULOPLASMIN: 36 mg/dL (ref 18–53)

## 2015-10-07 ENCOUNTER — Other Ambulatory Visit: Payer: Self-pay | Admitting: Family Medicine

## 2015-10-09 LAB — LIPID PANEL
Cholesterol: 157 mg/dL (ref 0–200)
HDL: 46 mg/dL (ref 35–70)
LDL CALC: 88 mg/dL
LDl/HDL Ratio: 1.9
Triglycerides: 113 mg/dL (ref 40–160)

## 2015-10-09 LAB — BASIC METABOLIC PANEL: Glucose: 97 mg/dL

## 2015-10-14 ENCOUNTER — Encounter: Payer: Self-pay | Admitting: Family Medicine

## 2015-10-14 ENCOUNTER — Ambulatory Visit (INDEPENDENT_AMBULATORY_CARE_PROVIDER_SITE_OTHER): Payer: Managed Care, Other (non HMO) | Admitting: Family Medicine

## 2015-10-14 VITALS — BP 100/78 | HR 82 | Temp 98.3°F | Ht 65.5 in | Wt 218.5 lb

## 2015-10-14 DIAGNOSIS — Z6837 Body mass index (BMI) 37.0-37.9, adult: Secondary | ICD-10-CM

## 2015-10-14 DIAGNOSIS — Z1231 Encounter for screening mammogram for malignant neoplasm of breast: Secondary | ICD-10-CM | POA: Diagnosis not present

## 2015-10-14 DIAGNOSIS — Z0001 Encounter for general adult medical examination with abnormal findings: Secondary | ICD-10-CM | POA: Diagnosis not present

## 2015-10-14 DIAGNOSIS — E6609 Other obesity due to excess calories: Secondary | ICD-10-CM

## 2015-10-14 DIAGNOSIS — Z1211 Encounter for screening for malignant neoplasm of colon: Secondary | ICD-10-CM | POA: Insufficient documentation

## 2015-10-14 DIAGNOSIS — E669 Obesity, unspecified: Secondary | ICD-10-CM

## 2015-10-14 DIAGNOSIS — Z1239 Encounter for other screening for malignant neoplasm of breast: Secondary | ICD-10-CM

## 2015-10-14 MED ORDER — ONDANSETRON HCL 4 MG PO TABS: 4.0000 mg | ORAL_TABLET | Freq: Three times a day (TID) | ORAL | 0 refills | Status: DC | PRN

## 2015-10-14 MED ORDER — LIRAGLUTIDE -WEIGHT MANAGEMENT 18 MG/3ML ~~LOC~~ SOPN: 3.0000 mg | PEN_INJECTOR | Freq: Every day | SUBCUTANEOUS | 0 refills | Status: DC

## 2015-10-14 NOTE — Progress Notes (Signed)
Whitney Rumps, MD Phone: [phone]  Whitney Hill is a 45 y.o. female who presents today for physical exam.  Weight is down 10 pounds. She's been working on diet by cutting out cheese and crackers. She is also on Saxenda for weight loss. Notes had some mild constipation with this though this is improved with water intake and bananas. Also some nausea though this has improved overall. Exercises by doing physical therapy previously though she is going to start walking. Flu shot up-to-date. Tetanus vaccination up-to-date Due for mammogram. She will come back for a Pap smear. Notes typically 1 period per month lasting for about 7 days. In the past she has had bleeding in between her periods though she was evaluated by gynecology for this and was found to have some abnormal vessels overlying her cervix. She had this frozen has not had any issues with this since then. Does note her periods over time have gotten heavier over the first several days. Prior HIV testing in 2012. No tobacco use, alcohol use, or illicit drug use. Family history of renal cancer and breast cancer. She has an aunt who had BRCA positive testing. Patient notes she has been tested for BRCA and was negative. She had recent lab work through her work that will be input into the system.  Active Ambulatory Problems    Diagnosis Date Noted  . Hypothyroidism 10/01/2014  . Left knee pain 10/01/2014  . Cramps, extremity 10/01/2014  . Allergic rhinitis 10/01/2014  . S/P gastric bypass 10/01/2014  . Anemia 01/14/2015  . Preoperative evaluation to rule out surgical contraindication 04/12/2015  . Plantar fasciitis 05/13/2015  . Knee osteochondritis dessicans 05/25/2015  . Status post left partial knee replacement, medial aspect 07/09/2015  . Obesity 07/09/2015  . Skin nodule 09/20/2015  . Encounter for general adult medical examination with abnormal findings 10/14/2015   Resolved Ambulatory Problems    Diagnosis Date Noted   . Surgery, elective, encounter for clearance 01/14/2015   Past Medical History:  Diagnosis Date  . Arthritis   . Complication of anesthesia   . DVT (deep venous thrombosis) ([place])   . Gastric ulcer   . Headache   . Heterozygous factor V Leiden mutation ([place])   . Hypothyroid   . Iron deficiency anemia     Family History  Problem Relation Age of Onset  . Arthritis      parent  . Breast cancer      aunt, grandmother  . Hyperlipidemia      parent  . Stroke      parent  . Hypertension      parent  . Kidney disease      parent, grandparent  . Diabetes      parent, grandparent  . Renal cancer      parent    Social History   Social History  . Marital status: Married    Spouse name: N/A  . Number of children: N/A  . Years of education: N/A   Occupational History  . Not on file.   Social History Main Topics  . Smoking status: Never Smoker  . Smokeless tobacco: Never Used  . Alcohol use No  . Drug use: No  . Sexual activity: Not on file   Other Topics Concern  . Not on file   Social History Narrative  . No narrative on file    ROS  General:  Negative for nexplained weight loss, fever Skin: Negative for new or changing mole, sore that won't heal  HEENT: Negative for trouble hearing, trouble seeing, ringing in ears, mouth sores, hoarseness, change in voice, dysphagia. CV:  Negative for chest pain, dyspnea, edema, palpitations Resp: Negative for cough, dyspnea, hemoptysis GI: Positive for constipation, Negative for nausea, vomiting, diarrhea, abdominal pain, melena, hematochezia. GU: Negative for dysuria, incontinence, urinary hesitance, hematuria, vaginal or penile discharge, polyuria, sexual difficulty, lumps in testicle or breasts MSK: Negative for muscle cramps or aches, joint pain or swelling Neuro: Negative for headaches, weakness, numbness, dizziness, passing out/fainting Psych: Negative for depression, anxiety, memory problems  Objective  Physical  Exam Vitals:   10/14/15 1054  BP: 100/78  Pulse: 82  Temp: 98.3 F (36.8 C)    BP Readings from Last 3 Encounters:  10/14/15 100/78  09/20/15 114/68  08/13/15 118/76   Wt Readings from Last 3 Encounters:  10/14/15 218 lb 8 oz (99.1 kg)  09/20/15 228 lb 9.6 oz (103.7 kg)  08/13/15 239 lb 6.4 oz (108.6 kg)    Physical Exam  Constitutional: She is well-developed, well-nourished, and in no distress.  HENT:  Head: Normocephalic and atraumatic.  Mouth/Throat: Oropharynx is clear and moist. No oropharyngeal exudate.  Eyes: Conjunctivae are normal. Pupils are equal, round, and reactive to light.  Cardiovascular: Normal rate, regular rhythm and normal heart sounds.   Pulmonary/Chest: Effort normal and breath sounds normal.  Abdominal: Soft. Bowel sounds are normal. She exhibits no distension. There is no tenderness. There is no rebound and no guarding.  Genitourinary:  Genitourinary Comments: Patient deferred pelvic exam today, breast exam with no masses or skin changes or nipple inversion  Musculoskeletal: She exhibits no edema.  Neurological: She is alert. Gait normal.  Skin: Skin is warm and dry.     Assessment/Plan:   Encounter for general adult medical examination with abnormal findings Overall doing well. Weight is trending down. Discussed continued diet and exercise. Continue Saxenda. Refill provided. Health maintenance brought up-to-date. Mammogram ordered. She'll return for a Pap smear. Lab work recently done. Advised on calcium supplementation and vitamin D supplementation given slightly elevated parathyroid hormone in setting of gastric bypass.   Orders Placed This Encounter  Procedures  . MM SCREENING BREAST TOMO BILATERAL    Standing Status:   Future    Standing Expiration Date:   12/13/2016    Order Specific Question:   Reason for Exam (SYMPTOM  OR DIAGNOSIS REQUIRED)    Answer:   Breast cancer screening    Order Specific Question:   Is the patient pregnant?     Answer:   No    Order Specific Question:   Preferred imaging location?    Answer:   [facility] ordered this encounter  Medications  . Liraglutide -Weight Management (SAXENDA) 18 MG/3ML SOPN    Sig: Inject 3 mg into the skin daily.    Dispense:  54 mL    Refill:  0  . ondansetron (ZOFRAN) 4 MG tablet    Sig: Take 1 tablet (4 mg total) by mouth every 8 (eight) hours as needed for nausea or vomiting.    Dispense:  20 tablet    Refill:  0     Whitney Rumps, MD [place]

## 2015-10-14 NOTE — Patient Instructions (Signed)
Nice to see you. Please continue to work on diet and exercise. You can continue the Saxenda. We will get you scheduled for mammogram. We will have you return for a Pap smear.

## 2015-10-14 NOTE — Assessment & Plan Note (Signed)
Overall doing well. Weight is trending down. Discussed continued diet and exercise. Continue Saxenda. Refill provided. Health maintenance brought up-to-date. Mammogram ordered. She'll return for a Pap smear. Lab work recently done. Advised on calcium supplementation and vitamin D supplementation given slightly elevated parathyroid hormone in setting of gastric bypass.

## 2015-11-01 ENCOUNTER — Inpatient Hospital Stay: Payer: Managed Care, Other (non HMO) | Attending: Internal Medicine

## 2015-11-01 DIAGNOSIS — E039 Hypothyroidism, unspecified: Secondary | ICD-10-CM | POA: Diagnosis not present

## 2015-11-01 DIAGNOSIS — D509 Iron deficiency anemia, unspecified: Secondary | ICD-10-CM | POA: Diagnosis not present

## 2015-11-01 DIAGNOSIS — M1990 Unspecified osteoarthritis, unspecified site: Secondary | ICD-10-CM | POA: Insufficient documentation

## 2015-11-01 DIAGNOSIS — D6851 Activated protein C resistance: Secondary | ICD-10-CM | POA: Insufficient documentation

## 2015-11-01 DIAGNOSIS — D649 Anemia, unspecified: Secondary | ICD-10-CM

## 2015-11-01 DIAGNOSIS — Z79899 Other long term (current) drug therapy: Secondary | ICD-10-CM | POA: Diagnosis not present

## 2015-11-01 DIAGNOSIS — Z9884 Bariatric surgery status: Secondary | ICD-10-CM | POA: Insufficient documentation

## 2015-11-01 DIAGNOSIS — Z86718 Personal history of other venous thrombosis and embolism: Secondary | ICD-10-CM | POA: Insufficient documentation

## 2015-11-01 LAB — CBC WITH DIFFERENTIAL/PLATELET
Basophils Absolute: 0 10*3/uL (ref 0–0.1)
Basophils Relative: 0 %
Eosinophils Absolute: 0.2 10*3/uL (ref 0–0.7)
Eosinophils Relative: 3 %
HEMATOCRIT: 37.3 % (ref 35.0–47.0)
Hemoglobin: 12.7 g/dL (ref 12.0–16.0)
LYMPHS ABS: 2.5 10*3/uL (ref 1.0–3.6)
LYMPHS PCT: 35 %
MCH: 32.4 pg (ref 26.0–34.0)
MCHC: 34.1 g/dL (ref 32.0–36.0)
MCV: 95 fL (ref 80.0–100.0)
MONO ABS: 0.5 10*3/uL (ref 0.2–0.9)
MONOS PCT: 6 %
NEUTROS ABS: 4 10*3/uL (ref 1.4–6.5)
Neutrophils Relative %: 56 %
Platelets: 290 10*3/uL (ref 150–440)
RBC: 3.93 MIL/uL (ref 3.80–5.20)
RDW: 12.7 % (ref 11.5–14.5)
WBC: 7.1 10*3/uL (ref 3.6–11.0)

## 2015-11-01 LAB — IRON AND TIBC
Iron: 90 ug/dL (ref 28–170)
Saturation Ratios: 31 % (ref 10.4–31.8)
TIBC: 289 ug/dL (ref 250–450)
UIBC: 199 ug/dL

## 2015-11-01 LAB — FERRITIN: FERRITIN: 145 ng/mL (ref 11–307)

## 2015-11-03 ENCOUNTER — Inpatient Hospital Stay: Payer: Managed Care, Other (non HMO)

## 2015-11-03 ENCOUNTER — Inpatient Hospital Stay (HOSPITAL_BASED_OUTPATIENT_CLINIC_OR_DEPARTMENT_OTHER): Payer: Managed Care, Other (non HMO) | Admitting: Oncology

## 2015-11-03 VITALS — BP 133/91 | HR 80 | Temp 99.2°F | Resp 18 | Wt 218.7 lb

## 2015-11-03 DIAGNOSIS — Z9884 Bariatric surgery status: Secondary | ICD-10-CM | POA: Diagnosis not present

## 2015-11-03 DIAGNOSIS — Z79899 Other long term (current) drug therapy: Secondary | ICD-10-CM

## 2015-11-03 DIAGNOSIS — D509 Iron deficiency anemia, unspecified: Secondary | ICD-10-CM

## 2015-11-03 DIAGNOSIS — D6851 Activated protein C resistance: Secondary | ICD-10-CM

## 2015-11-03 DIAGNOSIS — D508 Other iron deficiency anemias: Secondary | ICD-10-CM

## 2015-11-03 NOTE — Progress Notes (Signed)
States is feeling well. Offers no complaints. 

## 2015-11-03 NOTE — Progress Notes (Signed)
Whitney Hill  Telephone:([phone]) [phone]  [phone]) [phone]     Whitney Hill DOB: February 14, 1970  MR#: [id number]  [id number]  Patient Care Team: Leone Haven, MD as PCP - General (Family Medicine) Kennith Center, RD as Dietitian (Family Medicine)  CHIEF COMPLAINT:  Chief Complaint  Patient presents with  . Anemia   # Iron deficiency Anemia [sec to gastric bypass]; FEB 2017- IV ferrahem x4   # Hx DVT of LLE [s/p s/p Surgery- 2011- 3 months anti-coag]; # Hx of factor V leiden [mom]  # 2012- Gastric by pass-  INTERVAL HISTORY: Patient returns to clinic today for evaluation and possible IV Feraheme. She currently feels well and is asymptomatic. She does not complain of any weakness or fatigue. She has no neurologic complaints. She denies any easy bleeding or bruising. She has a good appetite and denies weight loss. She has no chest pain or shortness of breath. She denies any nausea, vomiting, consultation, or diarrhea. Patient feels at her baseline and offers no specific complaints today.   REVIEW OF SYSTEMS:   Review of Systems  Constitutional: Negative.  Negative for fever, malaise/fatigue and weight loss.  HENT: Negative.   Eyes: Negative.   Respiratory: Negative.  Negative for cough and shortness of breath.   Cardiovascular: Negative.  Negative for chest pain and leg swelling.  Gastrointestinal: Negative.  Negative for abdominal pain, blood in stool and melena.  Genitourinary: Negative.   Musculoskeletal: Negative.   Skin: Negative.   Neurological: Negative.  Negative for weakness.  Endo/Heme/Allergies: Does not bruise/bleed easily.  Psychiatric/Behavioral: Negative.  The patient is not nervous/anxious.     As per HPI. Otherwise, a complete review of systems is negative.  ONCOLOGY HISTORY:  No history exists.    PAST MEDICAL HISTORY: Past Medical History:  Diagnosis Date  . Arthritis    OA  . Complication of anesthesia    "ITCHING TO  FACE, BENADRYL HELP BUT DROPS BLOOD PRESSURE"  . DVT (deep venous thrombosis) ([place])    following first knee surgery in 2011  . Gastric ulcer   . Headache   . Heterozygous factor V Leiden mutation ([place])   . Hypothyroid   . Iron deficiency anemia     PAST SURGICAL HISTORY: Past Surgical History:  Procedure Laterality Date  . [place]  . CHOLECYSTECTOMY     1998  . [place], 2001  . GASTRIC BYPASS     2012, revision 2013  . KNEE ARTHROSCOPY     2011, 2013, 2015  . TOOTH EXTRACTION     2010  . TOTAL KNEE ARTHROPLASTY Left 05/25/2015   Procedure: LEFT TOTAL KNEE ARTHROPLASTY;  Surgeon: Renette Butters, MD;  Location: [facility];  Service: Orthopedics;  Laterality: Left;  . TUBAL LIGATION     2004    FAMILY HISTORY Family History  Problem Relation Age of Onset  . Arthritis      parent  . Breast cancer      aunt, grandmother  . Hyperlipidemia      parent  . Stroke      parent  . Hypertension      parent  . Kidney disease      parent, grandparent  . Diabetes      parent, grandparent  . Renal cancer      parent     ADVANCED DIRECTIVES:    HEALTH MAINTENANCE: Social History  Substance Use Topics  . Smoking status: Never Smoker  . Smokeless tobacco: Never Used  . Alcohol use No    Allergies  Allergen Reactions  . Paxil [Paroxetine Hcl] Anaphylaxis    Throat swelled shut  . Penicillins Hives    Current Outpatient Prescriptions  Medication Sig Dispense Refill  . B Complex Vitamins (B COMPLEX-B12 PO) Take 1 tablet by mouth daily.     . Cyanocobalamin (B-12 DOTS SL) Place 1 tablet under the tongue daily.     Marland Kitchen FLECTOR 1.3 % PTCH APPLY 1 PATCH TWICE A DAY AS DIRECTED  6  . Liraglutide -Weight Management (SAXENDA) 18 MG/3ML SOPN Inject 3 mg into the skin daily. 54 mL 0  . multivitamin-iron-minerals-folic acid (CENTRUM) chewable tablet Chew 1 tablet by mouth daily.    Marland Kitchen omeprazole (PRILOSEC) 40 MG  capsule Take 1 capsule (40 mg total) by mouth daily. (Patient taking differently: Take 40 mg by mouth as needed. ) 90 capsule 3  . ondansetron (ZOFRAN) 4 MG tablet Take 1 tablet (4 mg total) by mouth every 8 (eight) hours as needed for nausea or vomiting. 20 tablet 0  . Thyroid 97.5 MG TABS Take 1 tablet (97.5 mg total) by mouth daily. 90 tablet 1  . Vitamin D, Ergocalciferol, (DRISDOL) 50000 units CAPS capsule Take 1 capsule (50,000 Units total) by mouth every 7 (seven) days. 30 capsule 0   No current facility-administered medications for this visit.     OBJECTIVE: BP (!) 133/91 (BP Location: Left Arm, Patient Position: Sitting)   Pulse 80   Temp 99.2 F (37.3 C) (Tympanic)   Resp 18   Wt 218 lb 11.1 oz (99.2 kg)   BMI 35.84 kg/m    Body mass index is 35.84 kg/m.    ECOG FS:0 - Asymptomatic  GENERAL: Well-nourished well-developed; Alert, no distress and comfortable. Alone.  EYES: no pallor or icterus OROPHARYNX: no thrush or ulceration; good dentition  NECK: supple, no masses felt LUNGS: clear to auscultation and No wheeze or crackles HEART/CVS: regular rate & rhythm and no murmurs; No lower extremity edema ABDOMEN:abdomen soft, non-tender  Musculoskeletal:no cyanosis of digits and no clubbing  PSYCH: alert & oriented x 3 with fluent speech NEURO: no focal motor/sensory deficits SKIN: no rashes or significant lesions  LAB RESULTS:  Appointment on 11/01/2015  Component Date Value Ref Range Status  . WBC 11/01/2015 7.1  3.6 - 11.0 K/uL Final  . RBC 11/01/2015 3.93  3.80 - 5.20 MIL/uL Final  . Hemoglobin 11/01/2015 12.7  12.0 - 16.0 g/dL Final  . HCT 11/01/2015 37.3  35.0 - 47.0 % Final  . MCV 11/01/2015 95.0  80.0 - 100.0 fL Final  . MCH 11/01/2015 32.4  26.0 - 34.0 pg Final  . MCHC 11/01/2015 34.1  32.0 - 36.0 g/dL Final  . RDW 11/01/2015 12.7  11.5 - 14.5 % Final  . Platelets 11/01/2015 290  150 - 440 K/uL Final  . Neutrophils Relative % 11/01/2015 56  % Final  .  Neutro Abs 11/01/2015 4.0  1.4 - 6.5 K/uL Final  . Lymphocytes Relative 11/01/2015 35  % Final  . Lymphs Abs 11/01/2015 2.5  1.0 - 3.6 K/uL Final  . Monocytes Relative 11/01/2015 6  % Final  . Monocytes Absolute 11/01/2015 0.5  0.2 - 0.9 K/uL Final  . Eosinophils Relative 11/01/2015 3  % Final  . Eosinophils Absolute 11/01/2015 0.2  0 - 0.7 K/uL Final  . Basophils Relative 11/01/2015 0  % Final  .  Basophils Absolute 11/01/2015 0.0  0 - 0.1 K/uL Final  . Iron 11/01/2015 90  28 - 170 ug/dL Final  . TIBC 11/01/2015 289  250 - 450 ug/dL Final  . Saturation Ratios 11/01/2015 31  10.4 - 31.8 % Final  . UIBC 11/01/2015 199  ug/dL Final  . Ferritin 11/01/2015 145  11 - 307 ng/mL Final    STUDIES: No results found.  ASSESSMENT & PLAN:   1. Iron deficiency anemia sec to gastric bypass- Patient's hemoglobin And iron studies are within normal limits today. No intervention is needed. Patient does not require IV Feraheme. Return to clinic in 3 months with repeat laboratory work and further evaluation.   2. Factor V Leiden heterozygosity- continue prophylactic anticoagulation as needed, but patient does not require long-term anticoagulation at this time.  Patient expressed understanding and was in agreement with this plan. She also understands that She can call clinic at any time with any questions, concerns, or complaints.     Lloyd Huger, MD   11/08/2015 8:21 AM

## 2015-11-16 ENCOUNTER — Ambulatory Visit
Admission: RE | Admit: 2015-11-16 | Discharge: 2015-11-16 | Disposition: A | Discharge status: Home / Self Care | Payer: Managed Care, Other (non HMO) | Source: Ambulatory Visit | Attending: Family Medicine | Admitting: Family Medicine

## 2015-11-16 DIAGNOSIS — Z1231 Encounter for screening mammogram for malignant neoplasm of breast: Secondary | ICD-10-CM | POA: Diagnosis present

## 2015-11-16 DIAGNOSIS — Z1239 Encounter for other screening for malignant neoplasm of breast: Secondary | ICD-10-CM

## 2015-11-17 ENCOUNTER — Other Ambulatory Visit: Payer: Self-pay | Admitting: Family Medicine

## 2015-11-17 ENCOUNTER — Encounter: Payer: Self-pay | Admitting: Family Medicine

## 2015-11-17 DIAGNOSIS — E669 Obesity, unspecified: Secondary | ICD-10-CM

## 2015-11-17 MED ORDER — ONDANSETRON HCL 4 MG PO TABS: 4.0000 mg | ORAL_TABLET | Freq: Three times a day (TID) | ORAL | 0 refills | Status: DC | PRN

## 2015-11-24 ENCOUNTER — Ambulatory Visit (INDEPENDENT_AMBULATORY_CARE_PROVIDER_SITE_OTHER): Payer: Managed Care, Other (non HMO) | Admitting: Family Medicine

## 2015-11-24 ENCOUNTER — Encounter: Payer: Self-pay | Admitting: Family Medicine

## 2015-11-24 VITALS — BP 102/66 | HR 83 | Temp 98.0°F | Wt 214.0 lb

## 2015-11-24 DIAGNOSIS — E6609 Other obesity due to excess calories: Secondary | ICD-10-CM

## 2015-11-24 DIAGNOSIS — J069 Acute upper respiratory infection, unspecified: Secondary | ICD-10-CM | POA: Diagnosis not present

## 2015-11-24 DIAGNOSIS — Z124 Encounter for screening for malignant neoplasm of cervix: Secondary | ICD-10-CM

## 2015-11-24 DIAGNOSIS — N926 Irregular menstruation, unspecified: Secondary | ICD-10-CM | POA: Insufficient documentation

## 2015-11-24 DIAGNOSIS — Z6835 Body mass index (BMI) 35.0-35.9, adult: Secondary | ICD-10-CM

## 2015-11-24 LAB — POCT URINE PREGNANCY: Preg Test, Ur: NEGATIVE

## 2015-11-24 MED ORDER — LORATADINE 10 MG PO TABS: 10.0000 mg | ORAL_TABLET | Freq: Every day | ORAL | 11 refills | Status: DC

## 2015-11-24 MED ORDER — FLUTICASONE PROPIONATE 50 MCG/ACT NA SUSP: 2.0000 | Freq: Every day | NASAL | 6 refills | Status: DC

## 2015-11-24 NOTE — Assessment & Plan Note (Addendum)
Symptoms most consistent with viral upper respiratory infection. She can try Claritin and Flonase. She'll continue to monitor.

## 2015-11-24 NOTE — Patient Instructions (Signed)
Nice to see you. We are going to refer you to gynecology for your Pap smear. Please start on Claritin and Flonase for your request for symptoms. Please continue your Saxenda.

## 2015-11-24 NOTE — Progress Notes (Signed)
  Tommi Rumps, MD Phone: [phone]  Whitney Hill is a 45 y.o. female who presents today for follow-up.  Obesity: Patient is doing [place]. She is down 4 pounds since the end of October. Notes nausea has improved significantly. Notes she did switch to taking it at night when she was sick with an upper respiratory infection recently. Didn't lose any weight with taking it at night. Note she did get approved for revision of her bypass though she thinks she is going to stick with Saxenda given that she has lost weight.  She reports she is due for Pap smear. We are to complete this today. She is late for her menstrual cycle by several days. Notes they have been regular over the last year or so. Previously they were irregular and she was evaluated by gynecology for this. Has been tested for menopause previously and this was negative. Has had her tubes tied.  Upper respiratory infection: Patient noted 2-3 weeks ago developed sore throat, cough, and nasal and sinus congestion. Mucus from her nose was green though is now clear. She notes overall she is improving with regards to this. Still has some nasal mucus production.  PMH: nonsmoker.   ROS see history of present illness  Objective  Physical Exam Vitals:   11/24/15 1458  BP: 102/66  Pulse: 83  Temp: 98 F (36.7 C)    BP Readings from Last 3 Encounters:  11/24/15 102/66  11/03/15 (!) 133/91  10/14/15 100/78   Wt Readings from Last 3 Encounters:  11/24/15 214 lb (97.1 kg)  11/03/15 218 lb 11.1 oz (99.2 kg)  10/14/15 218 lb 8 oz (99.1 kg)    Physical Exam  Constitutional: No distress.  HENT:  Head: Normocephalic and atraumatic.  Mouth/Throat: Oropharynx is clear and moist. No oropharyngeal exudate.  Normal TMs bilaterally  Eyes: Conjunctivae are normal.  Cardiovascular: Normal rate, regular rhythm and normal heart sounds.   Pulmonary/Chest: Effort normal and breath sounds normal.  Genitourinary:  Genitourinary  Comments: Normal labia, normal vaginal mucosa, there is blood in the vaginal vault, unable to appreciate cervical os given positioning of the cervix  Neurological: She is alert. Gait normal.  Skin: Skin is warm and dry. She is not diaphoretic.     Assessment/Plan: Please see individual problem list.  Obesity Doing well on Saxenda. Weight has continued to trend down. Nausea is significantly improved. She will continue Saxenda.  Late period Reports late by 2 days. Could be perimenopausal. Urine pregnancy test negative. On exam appears that she is on her menstrual cycle at this time. She is status post tubal ligation as well. She'll monitor.  Acute upper respiratory infection Symptoms most consistent with viral upper respiratory infection. She can try Claritin and Flonase. She'll continue to monitor.   Orders Placed This Encounter  Procedures  . Ambulatory referral to Gynecology    Referral Priority:   Routine    Referral Type:   Consultation    Referral Reason:   Specialty Services Required    Requested Specialty:   Gynecology    Number of Visits Requested:   1  . POCT urine pregnancy    No orders of the defined types were placed in this encounter.   # Healthcare maintenance: Pap smear attempted though unable to get cervical os into view given positioning. We will refer to gynecology to complete Pap smear.   Tommi Rumps, MD [place]

## 2015-11-24 NOTE — Assessment & Plan Note (Signed)
Doing well on Saxenda. Weight has continued to trend down. Nausea is significantly improved. She will continue Saxenda.

## 2015-11-24 NOTE — Progress Notes (Signed)
Pre visit review using our clinic review tool, if applicable. No additional management support is needed unless otherwise documented below in the visit note. 

## 2015-11-24 NOTE — Assessment & Plan Note (Addendum)
Reports late by 2 days. Could be perimenopausal. Urine pregnancy test negative. On exam appears that she is on her menstrual cycle at this time. She is status post tubal ligation as well. She'll monitor.

## 2015-12-07 ENCOUNTER — Encounter: Payer: Self-pay | Admitting: Family Medicine

## 2015-12-15 ENCOUNTER — Encounter: Payer: Self-pay | Admitting: Family Medicine

## 2015-12-15 ENCOUNTER — Other Ambulatory Visit: Payer: Self-pay | Admitting: Family Medicine

## 2015-12-15 DIAGNOSIS — E669 Obesity, unspecified: Secondary | ICD-10-CM

## 2015-12-15 MED ORDER — ONDANSETRON HCL 4 MG PO TABS: 4.0000 mg | ORAL_TABLET | Freq: Three times a day (TID) | ORAL | 0 refills | Status: DC | PRN

## 2015-12-15 MED ORDER — VITAMIN D (ERGOCALCIFEROL) 1.25 MG (50000 UNIT) PO CAPS: 50000.0000 [IU] | ORAL_CAPSULE | ORAL | 0 refills | Status: DC

## 2016-01-11 ENCOUNTER — Encounter: Payer: Self-pay | Admitting: Family Medicine

## 2016-01-11 ENCOUNTER — Telehealth: Payer: Self-pay | Admitting: *Deleted

## 2016-01-11 NOTE — Telephone Encounter (Signed)
Spoke with the patient, she verbalized understanding. thanks

## 2016-01-11 NOTE — Telephone Encounter (Signed)
Patient requested a call in reference to a prior e-mail in reference to Saxenda. Patient stated that she will need a medical appeal verses a prior authorization due to her insurance not covering the medication ' Pt contact [phone]

## 2016-01-12 ENCOUNTER — Telehealth: Payer: Self-pay | Admitting: Family Medicine

## 2016-01-12 NOTE — Telephone Encounter (Signed)
Completed the PA, thanks

## 2016-01-12 NOTE — Telephone Encounter (Signed)
[employer] called and stated that a fax was sent over with some questions and that the code to access the referral is [email or web address].

## 2016-01-12 NOTE — Telephone Encounter (Signed)
Please advise 

## 2016-01-13 ENCOUNTER — Telehealth: Payer: Self-pay | Admitting: Family Medicine

## 2016-01-13 NOTE — Telephone Encounter (Signed)
The appeal for [place] has been denied, due to patient benefit plan will not cover [place] and patient is to see her formulary for alternative per insurance request. This was patients final appeal any other appeal will have to be done in face to face interview with a review board.

## 2016-01-13 NOTE — Telephone Encounter (Signed)
Patient should check with insurance for formulary alternatives and provide [place] with this list to see if there are any potential options. Thanks.

## 2016-01-14 NOTE — Telephone Encounter (Signed)
Entered new PA with different DX awaiting response.

## 2016-01-21 ENCOUNTER — Ambulatory Visit: Payer: Managed Care, Other (non HMO) | Admitting: Family Medicine

## 2016-01-28 ENCOUNTER — Telehealth: Payer: Self-pay | Admitting: Family Medicine

## 2016-01-28 ENCOUNTER — Encounter: Payer: Self-pay | Admitting: Family Medicine

## 2016-01-28 NOTE — Telephone Encounter (Signed)
Patient requested resubmit on PA for saxenda , completed again on 01/28/16 cover my meds.

## 2016-02-02 ENCOUNTER — Inpatient Hospital Stay: Payer: Commercial Managed Care - PPO | Attending: Internal Medicine | Admitting: *Deleted

## 2016-02-02 DIAGNOSIS — M1990 Unspecified osteoarthritis, unspecified site: Secondary | ICD-10-CM | POA: Insufficient documentation

## 2016-02-02 DIAGNOSIS — Z79899 Other long term (current) drug therapy: Secondary | ICD-10-CM | POA: Insufficient documentation

## 2016-02-02 DIAGNOSIS — D508 Other iron deficiency anemias: Secondary | ICD-10-CM

## 2016-02-02 DIAGNOSIS — E039 Hypothyroidism, unspecified: Secondary | ICD-10-CM | POA: Insufficient documentation

## 2016-02-02 DIAGNOSIS — D509 Iron deficiency anemia, unspecified: Secondary | ICD-10-CM | POA: Insufficient documentation

## 2016-02-02 DIAGNOSIS — Z9884 Bariatric surgery status: Secondary | ICD-10-CM | POA: Diagnosis not present

## 2016-02-02 DIAGNOSIS — Z8719 Personal history of other diseases of the digestive system: Secondary | ICD-10-CM | POA: Diagnosis not present

## 2016-02-02 DIAGNOSIS — D6851 Activated protein C resistance: Secondary | ICD-10-CM | POA: Insufficient documentation

## 2016-02-02 DIAGNOSIS — Z86718 Personal history of other venous thrombosis and embolism: Secondary | ICD-10-CM | POA: Diagnosis not present

## 2016-02-02 LAB — CBC WITH DIFFERENTIAL/PLATELET
Basophils Absolute: 0 10*3/uL (ref 0–0.1)
Basophils Relative: 1 %
EOS PCT: 3 %
Eosinophils Absolute: 0.2 10*3/uL (ref 0–0.7)
HCT: 36.3 % (ref 35.0–47.0)
Hemoglobin: 12.7 g/dL (ref 12.0–16.0)
LYMPHS ABS: 2.3 10*3/uL (ref 1.0–3.6)
LYMPHS PCT: 31 %
MCH: 33.8 pg (ref 26.0–34.0)
MCHC: 35 g/dL (ref 32.0–36.0)
MCV: 96.4 fL (ref 80.0–100.0)
MONOS PCT: 7 %
Monocytes Absolute: 0.6 10*3/uL (ref 0.2–0.9)
Neutro Abs: 4.6 10*3/uL (ref 1.4–6.5)
Neutrophils Relative %: 58 %
PLATELETS: 304 10*3/uL (ref 150–440)
RBC: 3.77 MIL/uL — AB (ref 3.80–5.20)
RDW: 13.1 % (ref 11.5–14.5)
WBC: 7.7 10*3/uL (ref 3.6–11.0)

## 2016-02-02 LAB — IRON AND TIBC
Iron: 75 ug/dL (ref 28–170)
SATURATION RATIOS: 22 % (ref 10.4–31.8)
TIBC: 342 ug/dL (ref 250–450)
UIBC: 267 ug/dL

## 2016-02-02 LAB — FERRITIN: Ferritin: 91 ng/mL (ref 11–307)

## 2016-02-04 ENCOUNTER — Other Ambulatory Visit: Payer: Self-pay | Admitting: Internal Medicine

## 2016-02-04 ENCOUNTER — Inpatient Hospital Stay: Payer: Commercial Managed Care - PPO

## 2016-02-04 ENCOUNTER — Inpatient Hospital Stay (HOSPITAL_BASED_OUTPATIENT_CLINIC_OR_DEPARTMENT_OTHER): Payer: Commercial Managed Care - PPO | Admitting: Internal Medicine

## 2016-02-04 DIAGNOSIS — Z9884 Bariatric surgery status: Secondary | ICD-10-CM | POA: Diagnosis not present

## 2016-02-04 DIAGNOSIS — D509 Iron deficiency anemia, unspecified: Secondary | ICD-10-CM | POA: Diagnosis not present

## 2016-02-04 DIAGNOSIS — E611 Iron deficiency: Secondary | ICD-10-CM | POA: Insufficient documentation

## 2016-02-04 DIAGNOSIS — D508 Other iron deficiency anemias: Secondary | ICD-10-CM

## 2016-02-04 DIAGNOSIS — Z79899 Other long term (current) drug therapy: Secondary | ICD-10-CM | POA: Diagnosis not present

## 2016-02-04 MED ORDER — SODIUM CHLORIDE 0.9 % IV SOLN
Freq: Once | INTRAVENOUS | Status: AC
  Administered 2016-02-04: 15:00:00 via INTRAVENOUS
  Filled 2016-02-04: qty 1000

## 2016-02-04 MED ORDER — FERUMOXYTOL INJECTION 510 MG/17 ML
510.0000 mg | Freq: Once | INTRAVENOUS | Status: AC
  Administered 2016-02-04: 510 mg via INTRAVENOUS
  Filled 2016-02-04: qty 17

## 2016-02-04 NOTE — Progress Notes (Signed)
[facility] OFFICE PROGRESS NOTE  Patient Care Team: Leone Haven, MD as PCP - General (Family Medicine) Kennith Center, RD as Dietitian (Family Medicine)   SUMMARY OF HEMATOLOGIC HISTORY:  # Iron deficiency Anemia [sec to gastric bypass]; FEB 2017- IV ferrahem x4   # Hx DVT of LLE [s/p s/p Surgery- 2011- 3 months anti-coag]; # Hx of factor V leiden [mom]  # 2012- Gastric by pass-   INTERVAL HISTORY:  Patient is here for follow-up of her iron deficiency anemia.  Patient had 4 doses of IV Feraheme at the beginning of last year.   She currently feels well and is asymptomatic. She does not complain of any weakness or fatigue. She has no neurologic complaints. She denies any easy bleeding or bruising. She has no chest pain or shortness of breath. She denies any nausea, vomiting, consultation, or diarrhea. Patient feels at her baseline and offers no specific complaints today.  Patient states that she was recently diagnosed with hasimtoto thyroid and is seeing an endocrinologist. She also has been taking a weight loss medication (Sexenda) which has been helping her loose approximately 40 lbs over the past few months.   REVIEW OF SYSTEMS:  A complete 10 point review of system is done which is negative except mentioned above/history of present illness.   PAST MEDICAL HISTORY :  Past Medical History:  Diagnosis Date  . Arthritis    OA  . Complication of anesthesia    "ITCHING TO FACE, BENADRYL HELP BUT DROPS BLOOD PRESSURE"  . DVT (deep venous thrombosis) ([place])    following first knee surgery in 2011  . Gastric ulcer   . Headache   . Heterozygous factor V Leiden mutation ([place])   . Hypothyroid   . Iron deficiency anemia     PAST SURGICAL HISTORY :   Past Surgical History:  Procedure Laterality Date  . [place]  . CHOLECYSTECTOMY     1998  . [place], 2001  . GASTRIC BYPASS     2012, revision 2013  .  KNEE ARTHROSCOPY     2011, 2013, 2015  . TOOTH EXTRACTION     2010  . TOTAL KNEE ARTHROPLASTY Left 05/25/2015   Procedure: LEFT TOTAL KNEE ARTHROPLASTY;  Surgeon: Renette Butters, MD;  Location: [facility];  Service: Orthopedics;  Laterality: Left;  . TUBAL LIGATION     2004    to rule out gastric ulcer. FAMILY HISTORY :   Family History  Problem Relation Age of Onset  . Arthritis      parent  . Breast cancer      aunt, grandmother  . Hyperlipidemia      parent  . Stroke      parent  . Hypertension      parent  . Kidney disease      parent, grandparent  . Diabetes      parent, grandparent  . Renal cancer      parent  . Breast cancer Maternal Aunt 68    BRACA + dx twice age 63 and 40  . Breast cancer Maternal Grandmother     late 30's    SOCIAL HISTORY:   Social History  Substance Use Topics  . Smoking status: Never Smoker  . Smokeless tobacco: Never Used  . Alcohol use No    ALLERGIES:  is allergic to paxil [paroxetine hcl] and penicillins.  MEDICATIONS:  Current Outpatient Prescriptions  Medication Sig Dispense Refill  . B Complex Vitamins (B COMPLEX-B12 PO) Take 1 tablet by mouth daily.     . Cyanocobalamin (B-12 DOTS SL) Place 1 tablet under the tongue daily.     Marland Kitchen FLECTOR 1.3 % PTCH APPLY 1 PATCH TWICE A DAY AS DIRECTED  6  . Liraglutide -Weight Management (SAXENDA) 18 MG/3ML SOPN Inject 3 mg into the skin daily. 54 mL 0  . multivitamin-iron-minerals-folic acid (CENTRUM) chewable tablet Chew 1 tablet by mouth daily.    Marland Kitchen omeprazole (PRILOSEC) 40 MG capsule Take 1 capsule (40 mg total) by mouth daily. (Patient taking differently: Take 40 mg by mouth as needed. ) 90 capsule 3  . Thyroid 97.5 MG TABS Take 1 tablet (97.5 mg total) by mouth daily. 90 tablet 1  . Vitamin D, Ergocalciferol, (DRISDOL) 50000 units CAPS capsule Take 1 capsule (50,000 Units total) by mouth every 7 (seven) days. 8 capsule 0  . fluticasone (FLONASE) 50 MCG/ACT nasal spray Place 2 sprays into  both nostrils daily. (Patient not taking: Reported on 02/04/2016) 16 g 6  . loratadine (CLARITIN) 10 MG tablet Take 1 tablet (10 mg total) by mouth daily. (Patient not taking: Reported on 02/04/2016) 30 tablet 11  . ondansetron (ZOFRAN) 4 MG tablet Take 1 tablet (4 mg total) by mouth every 8 (eight) hours as needed for nausea or vomiting. (Patient not taking: Reported on 02/04/2016) 20 tablet 0   No current facility-administered medications for this visit.     PHYSICAL EXAMINATION:   BP 111/75 (BP Location: Left Arm, Patient Position: Sitting)   Pulse 66   Temp 97 F (36.1 C) (Tympanic)   Wt 213 lb 4 oz (96.7 kg)   BMI 34.95 kg/m   Filed Weights   02/04/16 1407  Weight: 213 lb 4 oz (96.7 kg)    GENERAL: Well-nourished well-developed; Alert, no distress and comfortable.   Alone.  EYES: no pallor or icterus OROPHARYNX: no thrush or ulceration; good dentition  NECK: supple, no masses felt LYMPH:  no palpable lymphadenopathy in the cervical, axillary or inguinal regions LUNGS: clear to auscultation and  No wheeze or crackles HEART/CVS: regular rate & rhythm and no murmurs; No lower extremity edema ABDOMEN:abdomen soft, non-tender and normal bowel sounds Musculoskeletal:no cyanosis of digits and no clubbing  PSYCH: alert & oriented x 3 with fluent speech NEURO: no focal motor/sensory deficits SKIN:  no rashes or significant lesions  LABORATORY DATA:  I have reviewed the data as listed    Component Value Date/Time   NA 139 09/23/2015 1032   K 3.5 09/23/2015 1032   CL 106 09/23/2015 1032   CO2 27 09/23/2015 1032   GLUCOSE 93 09/23/2015 1032   BUN 10 09/23/2015 1032   CREATININE 0.73 09/23/2015 1032   CALCIUM 9.2 09/23/2015 1032   PROT 7.2 09/23/2015 1032   ALBUMIN 4.2 09/23/2015 1032   AST 11 09/23/2015 1032   ALT 8 09/23/2015 1032   ALKPHOS 75 09/23/2015 1032   BILITOT 0.3 09/23/2015 1032   GFRNONAA >60 05/14/2015 1019   GFRAA >60 05/14/2015 1019    No results found  for: SPEP, UPEP  Lab Results  Component Value Date   WBC 7.7 02/02/2016   NEUTROABS 4.6 02/02/2016   HGB 12.7 02/02/2016   HCT 36.3 02/02/2016   MCV 96.4 02/02/2016   PLT 304 02/02/2016      Chemistry      Component Value Date/Time   NA 139 09/23/2015  1032   K 3.5 09/23/2015 1032   CL 106 09/23/2015 1032   CO2 27 09/23/2015 1032   BUN 10 09/23/2015 1032   CREATININE 0.73 09/23/2015 1032   GLU 97 10/09/2015      Component Value Date/Time   CALCIUM 9.2 09/23/2015 1032   ALKPHOS 75 09/23/2015 1032   AST 11 09/23/2015 1032   ALT 8 09/23/2015 1032   BILITOT 0.3 09/23/2015 1032       RADIOGRAPHIC STUDIES: I have personally reviewed the radiological images as listed and agreed with the findings in the report. No results found.   ASSESSMENT & PLAN:   Iron deficiency anemia # IRON DEFICIENCY ANEMIA sec to gastric bypass-  Last feraheme was given in March 2017. Patients Hb is WNL today. Iron saturation is 22. Patient does not feel any different.  Proceed with iron today. She will only need one dose.   # Hx of factor V Leiden  mother/Hx of LLE DVT-  Checked factor V Leiden. Continue prophylactic anticoagulation as needed, but patient does not require long-term anticoagulation at this time.  # Follow-up in 6 months with labs (a few days before) and to see MD   Faythe Casa, NP   Jacquelin Hawking, NP 02/04/2016 2:47 PM

## 2016-02-04 NOTE — Assessment & Plan Note (Addendum)
#   IRON DEFICIENCY ANEMIA sec to gastric bypass-  Last feraheme was given in March 2017. Patients Hb is WNL today. Iron saturation is 22. Patient does not feel any different.  Proceed with iron today. She will only need one dose.   # Hx of factor V Leiden  mother/Hx of LLE DVT-  Checked factor V Leiden. Continue prophylactic anticoagulation as needed, but patient does not require long-term anticoagulation at this time.  # Follow-up in 6 months with labs (a few days before) and to see MD

## 2016-02-04 NOTE — Progress Notes (Signed)
Patient here today for follow up.   

## 2016-02-07 ENCOUNTER — Encounter: Payer: Self-pay | Admitting: Family Medicine

## 2016-02-07 ENCOUNTER — Ambulatory Visit (INDEPENDENT_AMBULATORY_CARE_PROVIDER_SITE_OTHER): Payer: Commercial Managed Care - PPO | Admitting: Family Medicine

## 2016-02-07 VITALS — BP 110/70 | HR 72 | Temp 98.8°F | Wt 210.6 lb

## 2016-02-07 DIAGNOSIS — E039 Hypothyroidism, unspecified: Secondary | ICD-10-CM

## 2016-02-07 DIAGNOSIS — N939 Abnormal uterine and vaginal bleeding, unspecified: Secondary | ICD-10-CM | POA: Diagnosis not present

## 2016-02-07 DIAGNOSIS — E6609 Other obesity due to excess calories: Secondary | ICD-10-CM

## 2016-02-07 DIAGNOSIS — Z6834 Body mass index (BMI) 34.0-34.9, adult: Secondary | ICD-10-CM

## 2016-02-07 DIAGNOSIS — E669 Obesity, unspecified: Secondary | ICD-10-CM | POA: Diagnosis not present

## 2016-02-07 LAB — HEMOGLOBIN A1C: Hgb A1c MFr Bld: 5.1 % (ref 4.6–6.5)

## 2016-02-07 LAB — POCT URINE PREGNANCY: PREG TEST UR: NEGATIVE

## 2016-02-07 LAB — TSH: TSH: 3.95 u[IU]/mL (ref 0.35–4.50)

## 2016-02-07 NOTE — Assessment & Plan Note (Signed)
Check TSH. Continue nature thyroid.

## 2016-02-07 NOTE — Assessment & Plan Note (Addendum)
Patient with vaginal bleeding for the last 2 weeks or so. Notes it has been heavy like a normal menstrual cycle over the last 5 days. Urine pregnancy test was negative. Advised pelvic exam though she deferred this. If she continues to have issues with bleeding she will see her gynecologist for evaluation. We'll obtain a TSH today as she does have thyroid disease and this could be contributing. Given return precautions.

## 2016-02-07 NOTE — Assessment & Plan Note (Signed)
Continue to work on diet and exercise. Check A1c. We will check into whether or not Saxenda will be approved. Could consider Victoza in the future.

## 2016-02-07 NOTE — Progress Notes (Signed)
Pre visit review using our clinic review tool, if applicable. No additional management support is needed unless otherwise documented below in the visit note. 

## 2016-02-07 NOTE — Progress Notes (Signed)
  Tommi Rumps, MD Phone: [phone]  Whitney Hill is a 46 y.o. female who presents today for follow-up.  Obesity: Patient notes she started walking for exercise. Trying to get to 2 miles daily. Notes her diet is unchanged. She's been on Saxenda though has been having trouble having her insurance cover this. She was down to 203 pounds though had to go off the [place] for a period of time and put some weight back on. She's been told multiple times that it has been approved and then it gets denied. She notes no nausea with the Saxenda.  Patient also has hypothyroidism. She is taking her thyroid medication. No skin changes, weight changes, heat or cold intolerance. Does note she gets some hives related to heat though these have been chronic and have occurred throughout her life.  Patient notes she's been on her period for about 2 weeks. She started spotting on January 15 and then has had bleeding for the last 5 days that is consistent with her normal periods. She notes no vision changes or headaches. No abdominal pain. She has had tubes tied. Recently had her Pap smear done through gynecology. It appears that they did a pelvic ultrasound given her history of menorrhagia with irregular cycles though I am unable to see this result.  PMH: nonsmoker.   ROS see history of present illness  Objective  Physical Exam Vitals:   02/07/16 1422  BP: 110/70  Pulse: 72  Temp: 98.8 F (37.1 C)    BP Readings from Last 3 Encounters:  02/07/16 110/70  02/04/16 111/75  11/24/15 102/66   Wt Readings from Last 3 Encounters:  02/07/16 210 lb 9.6 oz (95.5 kg)  02/04/16 213 lb 4 oz (96.7 kg)  11/24/15 214 lb (97.1 kg)    Physical Exam  Constitutional: No distress.  Cardiovascular: Normal rate, regular rhythm and normal heart sounds.   Pulmonary/Chest: Effort normal and breath sounds normal.  Genitourinary:  Genitourinary Comments: Patient deferred pelvic exam  Musculoskeletal: She exhibits  no edema.  Neurological: She is alert. Gait normal.  Skin: She is not diaphoretic.     Assessment/Plan: Please see individual problem list.  Hypothyroidism Check TSH. Continue nature thyroid.  Obesity Continue to work on diet and exercise. Check A1c. We will check into whether or not Saxenda will be approved. Could consider Victoza in the future.  Abnormal uterine bleeding Patient with vaginal bleeding for the last 2 weeks or so. Notes it has been heavy like a normal menstrual cycle over the last 5 days. Urine pregnancy test was negative. Advised pelvic exam though she deferred this. If she continues to have issues with bleeding she will see her gynecologist for evaluation. We'll obtain a TSH today as she does have thyroid disease and this could be contributing. Given return precautions.   Orders Placed This Encounter  Procedures  . TSH  . HgB A1c  . POCT urine pregnancy   Tommi Rumps, MD [place]

## 2016-02-07 NOTE — Patient Instructions (Signed)
Nice to see you. We will check on your Saxenda. We will check some lab work today and contact you with the results. Please monitor your bleeding and if this continues to be an issue please follow-up with your gynecologist for evaluation.

## 2016-02-09 NOTE — Telephone Encounter (Signed)
Pt called and wanted to talk with you in regards to a Pa. Please advise, thank you!  Call pt @ [phone]

## 2016-02-10 ENCOUNTER — Other Ambulatory Visit: Payer: Self-pay | Admitting: *Deleted

## 2016-02-10 DIAGNOSIS — Z6837 Body mass index (BMI) 37.0-37.9, adult: Principal | ICD-10-CM

## 2016-02-10 DIAGNOSIS — E6609 Other obesity due to excess calories: Secondary | ICD-10-CM

## 2016-02-10 NOTE — Telephone Encounter (Signed)
Last filled 10/14/15 54 ml 0rf

## 2016-02-10 NOTE — Telephone Encounter (Signed)
Patient wanted to report that a prior authorization or appeal will not be needed for the Rx Saxenda. Pt has been approved by her insurance for this medication for six months. Patient requested medication refill for Saxenda , however she has one refill remaining form a previous 90 day supply   Pt contact [phone]

## 2016-02-11 ENCOUNTER — Other Ambulatory Visit: Payer: Self-pay | Admitting: Family Medicine

## 2016-02-11 DIAGNOSIS — E6609 Other obesity due to excess calories: Secondary | ICD-10-CM

## 2016-02-11 DIAGNOSIS — E669 Obesity, unspecified: Secondary | ICD-10-CM

## 2016-02-11 DIAGNOSIS — Z6837 Body mass index (BMI) 37.0-37.9, adult: Principal | ICD-10-CM

## 2016-02-11 NOTE — Telephone Encounter (Signed)
Please find out what daily dosing the patient is on for the saxenda. Thanks.

## 2016-02-11 NOTE — Telephone Encounter (Signed)
Last OV 02/07/16 last filled vit D 12/15/15 8 0rf zofran 12/15/15 20 0rf saxenda 10/14/15 54 0rf

## 2016-02-12 ENCOUNTER — Other Ambulatory Visit: Payer: Self-pay | Admitting: Family Medicine

## 2016-02-12 DIAGNOSIS — E669 Obesity, unspecified: Secondary | ICD-10-CM

## 2016-02-13 NOTE — Telephone Encounter (Signed)
Please find out what dose she is currently on. Thanks.

## 2016-02-14 ENCOUNTER — Encounter: Payer: Self-pay | Admitting: Family Medicine

## 2016-02-14 MED ORDER — LIRAGLUTIDE -WEIGHT MANAGEMENT 18 MG/3ML ~~LOC~~ SOPN: 3.0000 mg | PEN_INJECTOR | Freq: Every day | SUBCUTANEOUS | 0 refills | Status: DC

## 2016-02-14 MED ORDER — VITAMIN D (ERGOCALCIFEROL) 1.25 MG (50000 UNIT) PO CAPS: 50000.0000 [IU] | ORAL_CAPSULE | ORAL | 0 refills | Status: DC

## 2016-02-14 MED ORDER — ONDANSETRON HCL 4 MG PO TABS: 4.0000 mg | ORAL_TABLET | Freq: Three times a day (TID) | ORAL | 0 refills | Status: DC | PRN

## 2016-02-14 NOTE — Telephone Encounter (Signed)
See other message

## 2016-02-14 NOTE — Telephone Encounter (Signed)
Patient is on 1.8 currently but will be at 3.0 with the new rx

## 2016-02-14 NOTE — Telephone Encounter (Signed)
Sent to pharmacy 

## 2016-02-14 NOTE — Telephone Encounter (Signed)
Patient is requesting this for nausea due to saxenda

## 2016-03-21 ENCOUNTER — Other Ambulatory Visit: Payer: Self-pay | Admitting: Family Medicine

## 2016-03-21 DIAGNOSIS — E669 Obesity, unspecified: Secondary | ICD-10-CM

## 2016-03-22 MED ORDER — ONDANSETRON HCL 4 MG PO TABS: 4.0000 mg | ORAL_TABLET | Freq: Three times a day (TID) | ORAL | 0 refills | Status: DC | PRN

## 2016-03-22 NOTE — Telephone Encounter (Signed)
Last OV 02/07/16 last filled 02/14/16 20 0rf

## 2016-03-30 ENCOUNTER — Encounter: Payer: Self-pay | Admitting: Family Medicine

## 2016-03-31 ENCOUNTER — Encounter: Payer: Self-pay | Admitting: *Deleted

## 2016-03-31 ENCOUNTER — Emergency Department
Admission: EM | Admit: 2016-03-31 | Discharge: 2016-04-01 | Disposition: A | Discharge status: Home / Self Care | Payer: Commercial Managed Care - PPO | Attending: Emergency Medicine | Admitting: Emergency Medicine

## 2016-03-31 DIAGNOSIS — E039 Hypothyroidism, unspecified: Secondary | ICD-10-CM | POA: Insufficient documentation

## 2016-03-31 DIAGNOSIS — Z79899 Other long term (current) drug therapy: Secondary | ICD-10-CM | POA: Insufficient documentation

## 2016-03-31 DIAGNOSIS — R109 Unspecified abdominal pain: Secondary | ICD-10-CM

## 2016-03-31 DIAGNOSIS — K5900 Constipation, unspecified: Secondary | ICD-10-CM | POA: Insufficient documentation

## 2016-03-31 LAB — URINALYSIS, COMPLETE (UACMP) WITH MICROSCOPIC
Bilirubin Urine: NEGATIVE
Glucose, UA: NEGATIVE mg/dL
Ketones, ur: 5 mg/dL — AB
LEUKOCYTES UA: NEGATIVE
Nitrite: NEGATIVE
Protein, ur: 30 mg/dL — AB
SPECIFIC GRAVITY, URINE: 1.027 (ref 1.005–1.030)
pH: 5 (ref 5.0–8.0)

## 2016-03-31 LAB — POCT PREGNANCY, URINE: Preg Test, Ur: NEGATIVE

## 2016-03-31 NOTE — ED Triage Notes (Signed)
Pt c/o constipation x 10 days. Pt has utilized OTC remedies w/o relief including: laxatives, stool softeners, enema. Pt c/o increasing abdominal pain, bloating, n/v. Pt states past problems w/ constipation. Pt c/o bilateral mid back pain.

## 2016-04-01 ENCOUNTER — Emergency Department: Payer: Commercial Managed Care - PPO

## 2016-04-01 LAB — CBC
HEMATOCRIT: 39.9 % (ref 35.0–47.0)
HEMOGLOBIN: 13.6 g/dL (ref 12.0–16.0)
MCH: 32.7 pg (ref 26.0–34.0)
MCHC: 34.1 g/dL (ref 32.0–36.0)
MCV: 96.1 fL (ref 80.0–100.0)
Platelets: 303 10*3/uL (ref 150–440)
RBC: 4.15 MIL/uL (ref 3.80–5.20)
RDW: 12.6 % (ref 11.5–14.5)
WBC: 10.7 10*3/uL (ref 3.6–11.0)

## 2016-04-01 LAB — COMPREHENSIVE METABOLIC PANEL
ALT: 11 U/L — ABNORMAL LOW (ref 14–54)
ANION GAP: 7 (ref 5–15)
AST: 16 U/L (ref 15–41)
Albumin: 4.5 g/dL (ref 3.5–5.0)
Alkaline Phosphatase: 71 U/L (ref 38–126)
BUN: 17 mg/dL (ref 6–20)
CHLORIDE: 106 mmol/L (ref 101–111)
CO2: 25 mmol/L (ref 22–32)
Calcium: 9.2 mg/dL (ref 8.9–10.3)
Creatinine, Ser: 0.79 mg/dL (ref 0.44–1.00)
GFR calc non Af Amer: >60 mL/min (ref >60)
Glucose, Bld: 94 mg/dL (ref 65–99)
POTASSIUM: 3.1 mmol/L — AB (ref 3.5–5.1)
Sodium: 138 mmol/L (ref 135–145)
Total Bilirubin: 0.6 mg/dL (ref 0.3–1.2)
Total Protein: 7.4 g/dL (ref 6.5–8.1)

## 2016-04-01 LAB — LIPASE, BLOOD: LIPASE: 16 U/L (ref 11–51)

## 2016-04-01 MED ORDER — DOCUSATE SODIUM 50 MG/5ML PO LIQD
100.0000 mg | Freq: Once | ORAL | Status: AC
  Administered 2016-04-01: 100 mg via ORAL
  Filled 2016-04-01: qty 10

## 2016-04-01 MED ORDER — MINERAL OIL RE ENEM
1.0000 | ENEMA | Freq: Once | RECTAL | Status: AC
  Administered 2016-04-01: 1 via RECTAL

## 2016-04-01 MED ORDER — MAGNESIUM CITRATE PO SOLN
1.0000 | Freq: Once | ORAL | Status: AC
  Administered 2016-04-01: 1 via ORAL
  Filled 2016-04-01: qty 296

## 2016-04-01 NOTE — ED Notes (Signed)
Pt. States unable to have normal bowel movement in the past 10 days.  Pt. States using OTC medication with no relief.  Pt. States pain to kidneys.  Pt. Denies change in diet or medications.

## 2016-04-01 NOTE — ED Notes (Signed)
On clarification of order, dr. Owens Shark states to instill soap suds, mineral oil enema, colace and mag citrate in enema form.

## 2016-04-01 NOTE — ED Provider Notes (Signed)
[facility] Emergency Department Provider Note _   First MD Initiated Contact with Patient 03/31/16 2359     (approximate)  I have reviewed the triage vital signs and the nursing notes.   HISTORY  Chief Complaint Abdominal Pain    HPI Whitney [place] is a 46 y.o. female with below list of chronic medical conditions presents to the emergency department with constipation 10 days. Patient states that she's tried laxatives and magnesium citrate minimal stool production. Patient states current pain score 6 out of 10 abdominal discomfort. Patient denies any vomiting no urinary symptoms no fever   Past Medical History:  Diagnosis Date  . Arthritis    OA  . Complication of anesthesia    "ITCHING TO FACE, BENADRYL HELP BUT DROPS BLOOD PRESSURE"  . DVT (deep venous thrombosis) ([place])    following first knee surgery in 2011  . Gastric ulcer   . Headache   . Heterozygous factor V Leiden mutation ([place])   . Hypothyroid   . Iron deficiency anemia     Patient Active Problem List   Diagnosis Date Noted  . Abnormal uterine bleeding 02/07/2016  . Iron deficiency 02/04/2016  . Late period 11/24/2015  . Acute upper respiratory infection 11/24/2015  . Encounter for general adult medical examination with abnormal findings 10/14/2015  . Skin nodule 09/20/2015  . Status post left partial knee replacement, medial aspect 07/09/2015  . Obesity 07/09/2015  . Knee osteochondritis dessicans 05/25/2015  . Plantar fasciitis 05/13/2015  . Preoperative evaluation to rule out surgical contraindication 04/12/2015  . Iron deficiency anemia 01/14/2015  . Hypothyroidism 10/01/2014  . Left knee pain 10/01/2014  . Cramps, extremity 10/01/2014  . Allergic rhinitis 10/01/2014  . S/P gastric bypass 10/01/2014    Past Surgical History:  Procedure Laterality Date  . [place]  . CHOLECYSTECTOMY     1998  . [place], 2001  . GASTRIC BYPASS     2012, revision 2013  . KNEE ARTHROSCOPY     2011, 2013, 2015  . TOOTH EXTRACTION     2010  . TOTAL KNEE ARTHROPLASTY Left 05/25/2015   Procedure: LEFT TOTAL KNEE ARTHROPLASTY;  Surgeon: Renette Butters, MD;  Location: [facility];  Service: Orthopedics;  Laterality: Left;  . TUBAL LIGATION     2004    Prior to Admission medications   Medication Sig Start Date End Date Taking? Authorizing Provider  B Complex Vitamins (B COMPLEX-B12 PO) Take 1 tablet by mouth daily.     Historical Provider, MD  Cyanocobalamin (B-12 DOTS SL) Place 1 tablet under the tongue daily.     Historical Provider, MD  FLECTOR 1.3 % PTCH APPLY 1 PATCH TWICE A DAY AS DIRECTED 05/17/15   Historical Provider, MD  fluticasone (FLONASE) 50 MCG/ACT nasal spray Place 2 sprays into both nostrils daily. 11/24/15   Leone Haven, MD  Liraglutide -Weight Management (SAXENDA) 18 MG/3ML SOPN Inject 3 mg into the skin daily. Start with 2.4 mg in to the skin daily for 7 days, then increase to 3 mg in the skin daily 02/14/16   Leone Haven, MD  loratadine (CLARITIN) 10 MG tablet Take 1 tablet (10 mg total) by mouth daily. 11/24/15   Leone Haven, MD  multivitamin-iron-minerals-folic acid (CENTRUM) chewable tablet Chew 1 tablet by mouth daily.    Historical Provider, MD  omeprazole (PRILOSEC) 40 MG capsule Take 1  capsule (40 mg total) by mouth daily. Patient taking differently: Take 40 mg by mouth as needed.  10/01/14   Leone Haven, MD  ondansetron (ZOFRAN) 4 MG tablet Take 1 tablet (4 mg total) by mouth every 8 (eight) hours as needed for nausea or vomiting. 03/22/16   Leone Haven, MD  Thyroid 97.5 MG TABS Take 1 tablet (97.5 mg total) by mouth daily. 08/19/15   Leone Haven, MD  Vitamin D, Ergocalciferol, (DRISDOL) 50000 units CAPS capsule Take 1 capsule (50,000 Units total) by mouth every 7 (seven) days. 02/14/16   Leone Haven, MD    Allergies Paxil [paroxetine hcl] and  Penicillins  Family History  Problem Relation Age of Onset  . Arthritis      parent  . Breast cancer      aunt, grandmother  . Hyperlipidemia      parent  . Stroke      parent  . Hypertension      parent  . Kidney disease      parent, grandparent  . Diabetes      parent, grandparent  . Renal cancer      parent  . Breast cancer Maternal Aunt 40    BRACA + dx twice age 3 and 59  . Breast cancer Maternal Grandmother     late 57's    Social History Social History  Substance Use Topics  . Smoking status: Never Smoker  . Smokeless tobacco: Never Used  . Alcohol use No    Review of Systems Constitutional: No fever/chills Eyes: No visual changes. ENT: No sore throat. Cardiovascular: Denies chest pain. Respiratory: Denies shortness of breath. Gastrointestinal: No abdominal pain.  No nausea, no vomiting.  No diarrhea.  Positive for constipation. Genitourinary: Negative for dysuria. Musculoskeletal: Negative for back pain. Skin: Negative for rash. Neurological: Negative for headaches, focal weakness or numbness.  10-point ROS otherwise negative.  ____________________________________________   PHYSICAL EXAM:  VITAL SIGNS: ED Triage Vitals  Enc Vitals Group     BP 03/31/16 2329 108/65     Pulse Rate 03/31/16 2329 75     Resp 03/31/16 2329 (!) 21     Temp 03/31/16 2329 98 F (36.7 C)     Temp Source 03/31/16 2329 Oral     SpO2 03/31/16 2329 100 %     Weight 03/31/16 2332 199 lb (90.3 kg)     Height 03/31/16 2329 5\' 5"  (1.651 m)     Head Circumference --      Peak Flow --      Pain Score 03/31/16 2332 6     Pain Loc --      Pain Edu? --      Excl. in [place]? --     Constitutional: Alert and oriented. Well appearing and in no acute distress. Eyes: Conjunctivae are normal. PERRL. EOMI. Head: Atraumatic. Mouth/Throat: Mucous membranes are moist.  Oropharynx non-erythematous. Neck: No stridor.   Cardiovascular: Normal rate, regular rhythm. Good peripheral  circulation. Grossly normal heart sounds. Respiratory: Normal respiratory effort.  No retractions. Lungs CTAB. Gastrointestinal: Soft and nontender. No distention.  Musculoskeletal: No lower extremity tenderness nor edema. No gross deformities of extremities. Neurologic:  Normal speech and language. No gross focal neurologic deficits are appreciated.  Skin:  Skin is warm, dry and intact. No rash noted. Psychiatric: Mood and affect are normal. Speech and behavior are normal.  ____________________________________________   LABS (all labs ordered are listed, but only abnormal results are displayed)  Labs Reviewed  COMPREHENSIVE METABOLIC PANEL - Abnormal; Notable for the following:       Result Value   Potassium 3.1 (*)    ALT 11 (*)    All other components within normal limits  URINALYSIS, COMPLETE (UACMP) WITH MICROSCOPIC - Abnormal; Notable for the following:    Color, Urine YELLOW (*)    APPearance HAZY (*)    Hgb urine dipstick SMALL (*)    Ketones, ur 5 (*)    Protein, ur 30 (*)    Bacteria, UA RARE (*)    Squamous Epithelial / LPF 6-30 (*)    All other components within normal limits  LIPASE, BLOOD  CBC  POC URINE PREG, ED  POCT PREGNANCY, URINE   _  RADIOLOGY I, [place] N BROWN, personally viewed and evaluated these images (plain radiographs) as part of my medical decision making, as well as reviewing the written report by the radiologist.  Dg Abdomen 1 View  Result Date: 04/01/2016 CLINICAL DATA:  46 year old female with constipation. EXAM: ABDOMEN - 1 VIEW COMPARISON:  None. FINDINGS: Multiple surgical sutures noted in the left hemiabdomen. There is no bowel dilatation or evidence of obstruction. No significant colonic stool burden. Air is noted within the colon. There is no free air or radiopaque calculi. Right upper quadrant cholecystectomy clips. The osseous structures and soft tissues appear unremarkable. IMPRESSION: No bowel obstruction or significant stool  burden. Electronically Signed   By: Anner Crete M.D.   On: 04/01/2016 00:56     Procedures   ____________________________________________   INITIAL IMPRESSION / ASSESSMENT AND PLAN / ED COURSE  Pertinent labs & imaging results that were available during my care of the patient were reviewed by me and considered in my medical decision making (see chart for details).  Abdominal x-ray performed which revealed no significant stool burden" per radiologist. Patient given enema in emergency department with minimal stool production.      ____________________________________________  FINAL CLINICAL IMPRESSION(S) / ED DIAGNOSES  Final diagnoses:  Abdominal pain, unspecified abdominal location     MEDICATIONS GIVEN DURING THIS VISIT:  Medications  docusate (COLACE) 50 MG/5ML liquid 100 mg (100 mg Oral Given 04/01/16 0107)  magnesium citrate solution 1 Bottle (1 Bottle Oral Given 04/01/16 0107)  mineral oil enema 1 enema (1 enema Rectal Given 04/01/16 0049)     NEW OUTPATIENT MEDICATIONS STARTED DURING THIS VISIT:  New Prescriptions   No medications on file    Modified Medications   No medications on file    Discontinued Medications   No medications on file     Note:  This document was prepared using Dragon voice recognition software and may include unintentional dictation errors.     Gregor Hams, MD 04/01/16 [phone]

## 2016-04-03 ENCOUNTER — Ambulatory Visit: Payer: Commercial Managed Care - PPO | Admitting: Family Medicine

## 2016-04-07 ENCOUNTER — Ambulatory Visit: Payer: Commercial Managed Care - PPO | Admitting: Family Medicine

## 2016-04-10 ENCOUNTER — Other Ambulatory Visit: Payer: Self-pay | Admitting: Family Medicine

## 2016-04-10 DIAGNOSIS — E669 Obesity, unspecified: Secondary | ICD-10-CM

## 2016-04-10 NOTE — Telephone Encounter (Signed)
Patient needs follow-up on her saxenda to ensure that this is still working. If she has continued to require nausea medication we may need to consider an alternative treatment.

## 2016-04-10 NOTE — Telephone Encounter (Signed)
Last OV 02/07/16 last filled 03/22/16 20 0rf

## 2016-04-11 ENCOUNTER — Encounter: Payer: Self-pay | Admitting: Family Medicine

## 2016-04-12 ENCOUNTER — Other Ambulatory Visit: Payer: Self-pay | Admitting: Family Medicine

## 2016-04-12 ENCOUNTER — Telehealth: Payer: Self-pay | Admitting: *Deleted

## 2016-04-12 DIAGNOSIS — E669 Obesity, unspecified: Secondary | ICD-10-CM

## 2016-04-12 MED ORDER — ONDANSETRON HCL 4 MG PO TABS: 4.0000 mg | ORAL_TABLET | Freq: Three times a day (TID) | ORAL | 0 refills | Status: DC | PRN

## 2016-04-12 NOTE — Telephone Encounter (Signed)
Please see patients mychart message

## 2016-04-12 NOTE — Telephone Encounter (Signed)
Left message to notify patient.

## 2016-04-12 NOTE — Telephone Encounter (Signed)
Sent to pharmacy 

## 2016-04-12 NOTE — Telephone Encounter (Signed)
OK to fill Zofran? Last OV 02/07/16.

## 2016-04-12 NOTE — Telephone Encounter (Signed)
Already sent to pharmacy 

## 2016-04-12 NOTE — Telephone Encounter (Signed)
Requested medication refill for : Zofran  Pharmacy: [facility] in [place] Please Contact Pt when ready or sent to Pharmacy:  [phone]

## 2016-04-13 NOTE — Telephone Encounter (Signed)
Patient has follow up scheduled.

## 2016-04-28 ENCOUNTER — Encounter: Payer: Self-pay | Admitting: Family Medicine

## 2016-04-28 ENCOUNTER — Ambulatory Visit (INDEPENDENT_AMBULATORY_CARE_PROVIDER_SITE_OTHER): Payer: Commercial Managed Care - PPO | Admitting: Family Medicine

## 2016-04-28 VITALS — BP 102/70 | HR 80 | Temp 98.2°F | Wt 196.2 lb

## 2016-04-28 DIAGNOSIS — E039 Hypothyroidism, unspecified: Secondary | ICD-10-CM

## 2016-04-28 DIAGNOSIS — Z6834 Body mass index (BMI) 34.0-34.9, adult: Secondary | ICD-10-CM

## 2016-04-28 DIAGNOSIS — N939 Abnormal uterine and vaginal bleeding, unspecified: Secondary | ICD-10-CM

## 2016-04-28 DIAGNOSIS — G8929 Other chronic pain: Secondary | ICD-10-CM

## 2016-04-28 DIAGNOSIS — E6609 Other obesity due to excess calories: Secondary | ICD-10-CM | POA: Diagnosis not present

## 2016-04-28 DIAGNOSIS — E876 Hypokalemia: Secondary | ICD-10-CM | POA: Diagnosis not present

## 2016-04-28 DIAGNOSIS — M25571 Pain in right ankle and joints of right foot: Secondary | ICD-10-CM | POA: Insufficient documentation

## 2016-04-28 DIAGNOSIS — K5900 Constipation, unspecified: Secondary | ICD-10-CM | POA: Diagnosis not present

## 2016-04-28 LAB — TSH: TSH: 2.57 u[IU]/mL (ref 0.35–4.50)

## 2016-04-28 LAB — BASIC METABOLIC PANEL
BUN: 18 mg/dL (ref 6–23)
CO2: 28 mEq/L (ref 19–32)
Calcium: 9.6 mg/dL (ref 8.4–10.5)
Chloride: 104 mEq/L (ref 96–112)
Creatinine, Ser: 0.82 mg/dL (ref 0.40–1.20)
GFR: 79.05 mL/min (ref >60.00)
Glucose, Bld: 94 mg/dL (ref 70–99)
Potassium: 4 mEq/L (ref 3.5–5.1)
Sodium: 138 mEq/L (ref 135–145)

## 2016-04-28 NOTE — Progress Notes (Signed)
Pre visit review using our clinic review tool, if applicable. No additional management support is needed unless otherwise documented below in the visit note. 

## 2016-04-28 NOTE — Assessment & Plan Note (Signed)
She'll continue to work on diet and exercise. Weight has steadily trended down with Saxenda. No nausea on this. She'll continue to monitor.

## 2016-04-28 NOTE — Assessment & Plan Note (Signed)
Followed by podiatry. I encouraged her to follow-up with them.

## 2016-04-28 NOTE — Assessment & Plan Note (Signed)
Patient with irregular menstrual cycles. Occasionally will miss a cycle. Negative pregnancy test prior to her most recent menstrual cycle. Has been evaluated by gynecology. It appears that they discussed endometrial ablation. Patient opted to monitor this. If worsens she'll let us know.

## 2016-04-28 NOTE — Assessment & Plan Note (Signed)
Seen in the ED for this. Has since resolved. Benign abdominal exam.

## 2016-04-28 NOTE — Patient Instructions (Addendum)
Nice to see you. We'll check some lab work today and contact with the results. Please follow-up with podiatry for your right ankle. You will need a pap smear in December of this year.

## 2016-04-28 NOTE — Progress Notes (Signed)
  Tommi Rumps, MD Phone: [phone]  Whitney Hill is a 46 y.o. female who presents today for follow-up.  Obesity: Patient is down another 3 pounds. She has been tolerating the Saxenda. She notes no nausea. She is still trying to eat a heavy protein diet. She was walking previously though her ankle started to hurt again. She does have hypothyroidism. No family history of medullary thyroid cancer. Her niece does have a history of papillary thyroid cancer.  Patient notes her hair is falling out and she feels a little more tired than usual. No cold intolerance. She is due to have TSH checked.  She noted right ankle started to hurt a little bit more. She got a shot at the end of December through podiatry and has been lasting 5-6 months. Notes some throbbing discomfort. Notes both her feet were swollen after walking on them consistently when on a trip to [place]. This has since resolved.  She reports no issues with constipation. She was seen in the ED for this previously. Having normal bowel movements now. No abdominal pain.  She's had irregular periods for many years. LMP was April 5. Had a negative pregnancy test in the emergency room in late March. Does pass a large number of clots when she has her menstrual cycles. Has been evaluated by gynecology for this. Pap smear with high-risk HPV that is not type 16 or 18.  PMH: nonsmoker.   ROS see history of present illness  Objective  Physical Exam Vitals:   04/28/16 1403  BP: 102/70  Pulse: 80  Temp: 98.2 F (36.8 C)    BP Readings from Last 3 Encounters:  04/28/16 102/70  04/01/16 111/82  02/07/16 110/70   Wt Readings from Last 3 Encounters:  04/28/16 196 lb 3.2 oz (89 kg)  03/31/16 199 lb (90.3 kg)  02/07/16 210 lb 9.6 oz (95.5 kg)    Physical Exam  Constitutional: No distress.  HENT:  Head: Normocephalic and atraumatic.  Neck: Neck supple. No thyromegaly present.  Cardiovascular: Normal rate, regular rhythm and  normal heart sounds.   Pulmonary/Chest: Effort normal and breath sounds normal.  Abdominal: Soft. Bowel sounds are normal. She exhibits no distension. There is no tenderness. There is no rebound and no guarding.  Musculoskeletal: She exhibits no edema.  No swelling of bilateral ankles, no tenderness of bilateral ankles or feet, 2+ DP pulses bilaterally  Neurological: She is alert. Gait normal.  Skin: Skin is warm and dry. She is not diaphoretic.     Assessment/Plan: Please see individual problem list.  Hypothyroidism Check TSH.  Abnormal uterine bleeding Patient with irregular menstrual cycles. Occasionally will miss a cycle. Negative pregnancy test prior to her most recent menstrual cycle. Has been evaluated by gynecology. It appears that they discussed endometrial ablation. Patient opted to monitor this. If worsens she'll let us know.  Obesity She'll continue to work on diet and exercise. Weight has steadily trended down with Saxenda. No nausea on this. She'll continue to monitor.  Constipation Seen in the ED for this. Has since resolved. Benign abdominal exam.  Right ankle pain Followed by podiatry. I encouraged her to follow-up with them.   Orders Placed This Encounter  Procedures  . TSH  . Basic Metabolic Panel (BMET)   Tommi Rumps, MD [place]

## 2016-04-28 NOTE — Assessment & Plan Note (Signed)
Check TSH 

## 2016-05-09 ENCOUNTER — Other Ambulatory Visit: Payer: Self-pay | Admitting: Family Medicine

## 2016-05-09 DIAGNOSIS — Z6837 Body mass index (BMI) 37.0-37.9, adult: Principal | ICD-10-CM

## 2016-05-09 DIAGNOSIS — E669 Obesity, unspecified: Secondary | ICD-10-CM

## 2016-05-09 DIAGNOSIS — E6609 Other obesity due to excess calories: Secondary | ICD-10-CM

## 2016-05-10 MED ORDER — ONDANSETRON HCL 4 MG PO TABS: 4.0000 mg | ORAL_TABLET | Freq: Three times a day (TID) | ORAL | 0 refills | Status: DC | PRN

## 2016-05-10 MED ORDER — LIRAGLUTIDE -WEIGHT MANAGEMENT 18 MG/3ML ~~LOC~~ SOPN: 3.0000 mg | PEN_INJECTOR | Freq: Every day | SUBCUTANEOUS | 0 refills | Status: DC

## 2016-05-10 NOTE — Telephone Encounter (Signed)
Sent to pharmacy 

## 2016-05-10 NOTE — Telephone Encounter (Signed)
Last OV 04/28/16 last filled ondansetron 04/12/16 20 0rf saxenda 74ml 0rf

## 2016-06-22 ENCOUNTER — Other Ambulatory Visit: Payer: Self-pay | Admitting: Family Medicine

## 2016-06-22 DIAGNOSIS — E669 Obesity, unspecified: Secondary | ICD-10-CM

## 2016-06-22 NOTE — Telephone Encounter (Signed)
Last OV 04/28/16 last filled 05/10/16 20 0rf

## 2016-06-28 ENCOUNTER — Ambulatory Visit: Payer: Commercial Managed Care - PPO | Admitting: Family Medicine

## 2016-07-14 ENCOUNTER — Other Ambulatory Visit: Payer: Self-pay | Admitting: Family Medicine

## 2016-07-14 ENCOUNTER — Encounter: Payer: Self-pay | Admitting: Family Medicine

## 2016-07-14 DIAGNOSIS — E6609 Other obesity due to excess calories: Secondary | ICD-10-CM

## 2016-07-14 DIAGNOSIS — Z6837 Body mass index (BMI) 37.0-37.9, adult: Principal | ICD-10-CM

## 2016-07-14 DIAGNOSIS — E669 Obesity, unspecified: Secondary | ICD-10-CM

## 2016-07-14 MED ORDER — ONDANSETRON HCL 4 MG PO TABS: 4.0000 mg | ORAL_TABLET | Freq: Three times a day (TID) | ORAL | 0 refills | Status: DC | PRN

## 2016-07-14 NOTE — Telephone Encounter (Signed)
Zofran approved. Patient is overdue for follow-up on her weight and she will need an appointment prior to getting a refill of the Saxenda. She also needs her vitamin D checked prior to getting a refill of the vitamin D supplementation. Thanks.

## 2016-07-14 NOTE — Telephone Encounter (Signed)
Please advise for refills, zofran was just refilled on 06/23/16. Refill for saxenda was on 05/10/16

## 2016-07-17 ENCOUNTER — Encounter: Payer: Self-pay | Admitting: Family Medicine

## 2016-07-17 ENCOUNTER — Telehealth: Payer: Self-pay | Admitting: *Deleted

## 2016-07-17 NOTE — Telephone Encounter (Signed)
It appears she has an office visit tomorrow. Thanks.

## 2016-07-17 NOTE — Telephone Encounter (Signed)
Pt called back looking for an update. Found pt an appt with Dr. Caryl Bis for tomorrow (7/10) at 1:30.

## 2016-07-17 NOTE — Telephone Encounter (Signed)
Can this be a nurse visit or do we need to move her OV to a sooner date?

## 2016-07-17 NOTE — Telephone Encounter (Signed)
Patient needs to have an updated weight to receive a refill on this. If her weight has not trended down significantly since her last visit there'll be no need to refill as the medication will not have been beneficial.

## 2016-07-17 NOTE — Telephone Encounter (Signed)
Patient has requested to have a call in reference to [place]  Pt contact [phone] Please see previous mychart messages

## 2016-07-17 NOTE — Telephone Encounter (Signed)
Patient would like 90 day refill on saxenda since her insurance will stop covering this after 08/08/16,patient states her refill was denied due to needing a weigh in per [place]

## 2016-07-18 ENCOUNTER — Encounter: Payer: Self-pay | Admitting: Family Medicine

## 2016-07-18 ENCOUNTER — Ambulatory Visit (INDEPENDENT_AMBULATORY_CARE_PROVIDER_SITE_OTHER): Payer: Commercial Managed Care - PPO | Admitting: Family Medicine

## 2016-07-18 DIAGNOSIS — S90862A Insect bite (nonvenomous), left foot, initial encounter: Secondary | ICD-10-CM | POA: Diagnosis not present

## 2016-07-18 DIAGNOSIS — Z6837 Body mass index (BMI) 37.0-37.9, adult: Secondary | ICD-10-CM

## 2016-07-18 DIAGNOSIS — B372 Candidiasis of skin and nail: Secondary | ICD-10-CM | POA: Insufficient documentation

## 2016-07-18 DIAGNOSIS — E6609 Other obesity due to excess calories: Secondary | ICD-10-CM

## 2016-07-18 DIAGNOSIS — D508 Other iron deficiency anemias: Secondary | ICD-10-CM | POA: Diagnosis not present

## 2016-07-18 DIAGNOSIS — S90861A Insect bite (nonvenomous), right foot, initial encounter: Secondary | ICD-10-CM | POA: Diagnosis not present

## 2016-07-18 DIAGNOSIS — W57XXXA Bitten or stung by nonvenomous insect and other nonvenomous arthropods, initial encounter: Secondary | ICD-10-CM

## 2016-07-18 MED ORDER — NYSTATIN 100000 UNIT/GM EX POWD: Freq: Two times a day (BID) | CUTANEOUS | 0 refills | Status: DC

## 2016-07-18 MED ORDER — LIRAGLUTIDE -WEIGHT MANAGEMENT 18 MG/3ML ~~LOC~~ SOPN: 3.0000 mg | PEN_INJECTOR | Freq: Every day | SUBCUTANEOUS | 0 refills | Status: DC

## 2016-07-18 MED ORDER — TRIAMCINOLONE ACETONIDE 0.1 % EX CREA: 1.0000 "application " | TOPICAL_CREAM | Freq: Two times a day (BID) | CUTANEOUS | 0 refills | Status: DC

## 2016-07-18 NOTE — Assessment & Plan Note (Signed)
Suspect the erythema that she gets in the area of her C-section scar is related to candidal intertrigo based on the picture that she showed me that did reveal erythema and satellite lesions. We will provide her with nystatin powder to use if this recurs. If not improving with this she'll follow-up.

## 2016-07-18 NOTE — Patient Instructions (Signed)
Nice to see you. We'll refill Saxenda. Please try the triamcinolone on the bug bites. You can also take Claritin over-the-counter. If you get recurrent irritation around your C-section scar please try nystatin powder. If this does not improve this please let us know.

## 2016-07-18 NOTE — Assessment & Plan Note (Signed)
Continue to follow with hematology

## 2016-07-18 NOTE — Progress Notes (Signed)
Whitney Rumps, MD Phone: [phone]  Lyndsie Wallman is a 46 y.o. female who presents today for follow-up.  Obesity: Patient is now down a total of 52 pounds since going on Saxenda. Still having some trouble getting her saxenda with her insurance. She is focused on protein heavy diet. Walking for exercise. She's working on getting to being able to hike. No nausea over the last several months.  She is followed by hematology for iron deficiency anemia. She did get an infusion at her last visit. She has labs later this month.  Patient does note a reaction having bug bites on her feet. She thinks they were chiggers. Notes her feet swelled up and itch significantly. Notes the bite area is still intermittently itching even though she was bitten a number of months ago.  Patient does note occasional itching and erythema in the area of her C-section scar. The area remains moist given that it is within the skin folds. Reports a long intermittent history of this. Typically gets up to go away by drying the area out.  PMH: nonsmoker.   ROS see history of present illness  Objective  Physical Exam Vitals:   07/18/16 1324  BP: 110/80  Pulse: 87  Temp: 98.5 F (36.9 C)    BP Readings from Last 3 Encounters:  07/18/16 110/80  04/28/16 102/70  04/01/16 111/82   Wt Readings from Last 3 Encounters:  07/18/16 187 lb 9.6 oz (85.1 kg)  04/28/16 196 lb 3.2 oz (89 kg)  03/31/16 199 lb (90.3 kg)    Physical Exam  Constitutional: No distress.  Cardiovascular: Normal rate, regular rhythm and normal heart sounds.   Pulmonary/Chest: Effort normal and breath sounds normal.  Abdominal: Soft. Bowel sounds are normal. She exhibits no distension. There is no tenderness. There is no rebound and no guarding.  Musculoskeletal: She exhibits no edema.  Neurological: She is alert. Gait normal.  Skin: She is not diaphoretic.  Excoriated papules bilateral feet, no erythema, minimal slight erythema in the  area of her C-section scar     Assessment/Plan: Please see individual problem list.  Obesity Patient is now down a total of 52 pounds since starting on Saxenda. We will refill this. She'll continue to monitor her weight and work on diet and exercise.  Iron deficiency anemia Continue to follow with hematology.  Candidal intertrigo Suspect the erythema that she gets in the area of her C-section scar is related to candidal intertrigo based on the picture that she showed me that did reveal erythema and satellite lesions. We will provide her with nystatin powder to use if this recurs. If not improving with this she'll follow-up.  Bug bites Suspect reaction she had on her feet is related to bug bites. No swelling or erythema currently. Advised to try triamcinolone and over-the-counter Claritin. If not improving or she has recurrence we'll refer to an allergist.   No orders of the defined types were placed in this encounter.   Meds ordered this encounter  Medications  . triamcinolone cream (KENALOG) 0.1 %    Sig: Apply 1 application topically 2 (two) times daily.    Dispense:  30 g    Refill:  0  . nystatin (NYSTATIN) powder    Sig: Apply topically 2 (two) times daily.    Dispense:  15 g    Refill:  0  . Liraglutide -Weight Management (SAXENDA) 18 MG/3ML SOPN    Sig: Inject 3 mg into the skin daily.    Dispense:  45 mL    Refill:  0   Whitney Rumps, MD [place]

## 2016-07-18 NOTE — Assessment & Plan Note (Signed)
Suspect reaction she had on her feet is related to bug bites. No swelling or erythema currently. Advised to try triamcinolone and over-the-counter Claritin. If not improving or she has recurrence we'll refer to an allergist.

## 2016-07-18 NOTE — Assessment & Plan Note (Signed)
Patient is now down a total of 52 pounds since starting on Saxenda. We will refill this. She'll continue to monitor her weight and work on diet and exercise.

## 2016-08-02 ENCOUNTER — Inpatient Hospital Stay: Payer: Commercial Managed Care - PPO | Attending: Oncology

## 2016-08-02 DIAGNOSIS — D509 Iron deficiency anemia, unspecified: Secondary | ICD-10-CM | POA: Insufficient documentation

## 2016-08-02 DIAGNOSIS — Z9884 Bariatric surgery status: Secondary | ICD-10-CM | POA: Insufficient documentation

## 2016-08-02 DIAGNOSIS — M1990 Unspecified osteoarthritis, unspecified site: Secondary | ICD-10-CM | POA: Diagnosis not present

## 2016-08-02 DIAGNOSIS — D508 Other iron deficiency anemias: Secondary | ICD-10-CM

## 2016-08-02 DIAGNOSIS — Z86718 Personal history of other venous thrombosis and embolism: Secondary | ICD-10-CM | POA: Insufficient documentation

## 2016-08-02 DIAGNOSIS — D6851 Activated protein C resistance: Secondary | ICD-10-CM | POA: Insufficient documentation

## 2016-08-02 DIAGNOSIS — E039 Hypothyroidism, unspecified: Secondary | ICD-10-CM | POA: Diagnosis not present

## 2016-08-02 DIAGNOSIS — Z96652 Presence of left artificial knee joint: Secondary | ICD-10-CM | POA: Diagnosis not present

## 2016-08-02 DIAGNOSIS — Z79899 Other long term (current) drug therapy: Secondary | ICD-10-CM | POA: Diagnosis not present

## 2016-08-02 DIAGNOSIS — Z8719 Personal history of other diseases of the digestive system: Secondary | ICD-10-CM | POA: Insufficient documentation

## 2016-08-02 LAB — COMPREHENSIVE METABOLIC PANEL
ALT: 11 U/L — AB (ref 14–54)
AST: 16 U/L (ref 15–41)
Albumin: 4.1 g/dL (ref 3.5–5.0)
Alkaline Phosphatase: 69 U/L (ref 38–126)
Anion gap: 4 — ABNORMAL LOW (ref 5–15)
BUN: 15 mg/dL (ref 6–20)
CHLORIDE: 108 mmol/L (ref 101–111)
CO2: 24 mmol/L (ref 22–32)
CREATININE: 0.73 mg/dL (ref 0.44–1.00)
Calcium: 9.1 mg/dL (ref 8.9–10.3)
GFR calc Af Amer: >60 mL/min (ref >60)
GFR calc non Af Amer: >60 mL/min (ref >60)
GLUCOSE: 88 mg/dL (ref 65–99)
Potassium: 3.5 mmol/L (ref 3.5–5.1)
SODIUM: 136 mmol/L (ref 135–145)
Total Bilirubin: 0.5 mg/dL (ref 0.3–1.2)
Total Protein: 6.9 g/dL (ref 6.5–8.1)

## 2016-08-02 LAB — CBC WITH DIFFERENTIAL/PLATELET
BASOS ABS: 0 10*3/uL (ref 0–0.1)
Basophils Relative: 0 %
EOS ABS: 0.2 10*3/uL (ref 0–0.7)
EOS PCT: 2 %
HCT: 36.5 % (ref 35.0–47.0)
HEMOGLOBIN: 12.7 g/dL (ref 12.0–16.0)
LYMPHS PCT: 34 %
Lymphs Abs: 2.7 10*3/uL (ref 1.0–3.6)
MCH: 33.5 pg (ref 26.0–34.0)
MCHC: 34.8 g/dL (ref 32.0–36.0)
MCV: 96.3 fL (ref 80.0–100.0)
Monocytes Absolute: 0.6 10*3/uL (ref 0.2–0.9)
Monocytes Relative: 8 %
NEUTROS PCT: 56 %
Neutro Abs: 4.4 10*3/uL (ref 1.4–6.5)
Platelets: 297 10*3/uL (ref 150–440)
RBC: 3.8 MIL/uL (ref 3.80–5.20)
RDW: 12.5 % (ref 11.5–14.5)
WBC: 7.9 10*3/uL (ref 3.6–11.0)

## 2016-08-02 LAB — IRON AND TIBC
IRON: 110 ug/dL (ref 28–170)
Saturation Ratios: 40 % — ABNORMAL HIGH (ref 10.4–31.8)
TIBC: 278 ug/dL (ref 250–450)
UIBC: 168 ug/dL

## 2016-08-02 LAB — FERRITIN: Ferritin: 146 ng/mL (ref 11–307)

## 2016-08-03 ENCOUNTER — Other Ambulatory Visit: Payer: Self-pay

## 2016-08-03 DIAGNOSIS — D508 Other iron deficiency anemias: Secondary | ICD-10-CM

## 2016-08-04 ENCOUNTER — Inpatient Hospital Stay (HOSPITAL_BASED_OUTPATIENT_CLINIC_OR_DEPARTMENT_OTHER): Payer: Commercial Managed Care - PPO | Admitting: Oncology

## 2016-08-04 ENCOUNTER — Encounter: Payer: Self-pay | Admitting: Oncology

## 2016-08-04 VITALS — BP 134/78 | HR 91 | Temp 97.7°F | Resp 16 | Wt 188.0 lb

## 2016-08-04 DIAGNOSIS — D509 Iron deficiency anemia, unspecified: Secondary | ICD-10-CM | POA: Diagnosis not present

## 2016-08-04 DIAGNOSIS — E611 Iron deficiency: Secondary | ICD-10-CM

## 2016-08-04 DIAGNOSIS — Z9884 Bariatric surgery status: Secondary | ICD-10-CM | POA: Diagnosis not present

## 2016-08-04 NOTE — Progress Notes (Signed)
Patient does not offer any problems today.  

## 2016-08-04 NOTE — Progress Notes (Signed)
Hematology/Oncology Consult note [facility]  [phone] [phone]  Patient Care Team: Leone Haven, MD as PCP - General (Family Medicine) Kennith Center, RD as Dietitian (Family Medicine)   Name of the patient: Whitney Hill  [id number]  02-05-1970   Date of visit: 08/04/16  Diagnosis- iron deficiency anemia secondary to gastric bypass  LLE DVT  Chief complaint/ Reason for visit- routine f/u  Heme/Onc history: Patient is a 46 year old female with a history of iron deficiency anemia secondary to gastric bypass in 2012. She gets periodic ferraheme. Also has a history of left lower extremity DVT for which she was in 3 months of anticoagulation. Family history positive for factor V Leiden in her mother. He is currently not on long-term anticoagulation. Last Shirlean Kelly was given in January 2018.  Interval history- weight loss pill is helping her. She has bug bites and itching secondary to that. Denies other complaints  ECOG PS- 0 Pain scale- 0   Review of systems- Review of Systems  Constitutional: Negative for chills, fever, malaise/fatigue and weight loss.  HENT: Negative for congestion, ear discharge and nosebleeds.   Eyes: Negative for blurred vision.  Respiratory: Negative for cough, hemoptysis, sputum production, shortness of breath and wheezing.   Cardiovascular: Negative for chest pain, palpitations, orthopnea and claudication.  Gastrointestinal: Negative for abdominal pain, blood in stool, constipation, diarrhea, heartburn, melena, nausea and vomiting.  Genitourinary: Negative for dysuria, flank pain, frequency, hematuria and urgency.  Musculoskeletal: Negative for back pain, joint pain and myalgias.  Skin: Positive for rash.  Neurological: Negative for dizziness, tingling, focal weakness, seizures, weakness and headaches.  Endo/Heme/Allergies: Does not bruise/bleed easily.  Psychiatric/Behavioral: Negative for  depression and suicidal ideas. The patient does not have insomnia.       Allergies  Allergen Reactions  . Paxil [Paroxetine Hcl] Anaphylaxis    Throat swelled shut  . Penicillins Hives     Past Medical History:  Diagnosis Date  . Arthritis    OA  . Complication of anesthesia    "ITCHING TO FACE, BENADRYL HELP BUT DROPS BLOOD PRESSURE"  . DVT (deep venous thrombosis) ([place])    following first knee surgery in 2011  . Gastric ulcer   . Headache   . Heterozygous factor V Leiden mutation ([place])   . Hypothyroid   . Iron deficiency anemia      Past Surgical History:  Procedure Laterality Date  . [place]  . CHOLECYSTECTOMY     1998  . [place], 2001  . GASTRIC BYPASS     2012, revision 2013  . KNEE ARTHROSCOPY     2011, 2013, 2015  . TOOTH EXTRACTION     2010  . TOTAL KNEE ARTHROPLASTY Left 05/25/2015   Procedure: LEFT TOTAL KNEE ARTHROPLASTY;  Surgeon: Renette Butters, MD;  Location: [place];  Service: Orthopedics;  Laterality: Left;  . TUBAL LIGATION     2004    Social History   Social History  . Marital status: Married    Spouse name: N/A  . Number of children: N/A  . Years of education: N/A   Occupational History  . Not on file.   Social History Main Topics  . Smoking status: Never Smoker  . Smokeless tobacco: Never Used  . Alcohol use No  . Drug use: No  . Sexual activity: Not on file   Other Topics  Concern  . Not on file   Social History Narrative  . No narrative on file    Family History  Problem Relation Age of Onset  . Arthritis Unknown        parent  . Breast cancer Unknown        aunt, grandmother  . Hyperlipidemia Unknown        parent  . Stroke Unknown        parent  . Hypertension Unknown        parent  . Kidney disease Unknown        parent, grandparent  . Diabetes Unknown        parent, grandparent  . Renal cancer Unknown        parent  . Breast cancer Maternal  Aunt 70       BRACA + dx twice age 29 and 81  . Breast cancer Maternal Grandmother        late 50's     Current Outpatient Prescriptions:  .  B Complex Vitamins (B COMPLEX-B12 PO), Take 1 tablet by mouth daily. , Disp: , Rfl:  .  Cyanocobalamin (B-12 DOTS SL), Place 1 tablet under the tongue daily. , Disp: , Rfl:  .  FLECTOR 1.3 % PTCH, APPLY 1 PATCH TWICE A DAY AS DIRECTED, Disp: , Rfl: 6 .  fluticasone (FLONASE) 50 MCG/ACT nasal spray, Place 2 sprays into both nostrils daily., Disp: 16 g, Rfl: 6 .  Liraglutide -Weight Management (SAXENDA) 18 MG/3ML SOPN, Inject 3 mg into the skin daily., Disp: 45 mL, Rfl: 0 .  loratadine (CLARITIN) 10 MG tablet, Take 1 tablet (10 mg total) by mouth daily., Disp: 30 tablet, Rfl: 11 .  multivitamin-iron-minerals-folic acid (CENTRUM) chewable tablet, Chew 1 tablet by mouth daily., Disp: , Rfl:  .  nystatin (NYSTATIN) powder, Apply topically 2 (two) times daily., Disp: 15 g, Rfl: 0 .  omeprazole (PRILOSEC) 40 MG capsule, Take 1 capsule (40 mg total) by mouth daily. (Patient taking differently: Take 40 mg by mouth as needed. ), Disp: 90 capsule, Rfl: 3 .  ondansetron (ZOFRAN) 4 MG tablet, Take 1 tablet (4 mg total) by mouth every 8 (eight) hours as needed for nausea or vomiting., Disp: 20 tablet, Rfl: 0 .  Thyroid 97.5 MG TABS, Take 1 tablet (97.5 mg total) by mouth daily., Disp: 90 tablet, Rfl: 1 .  triamcinolone cream (KENALOG) 0.1 %, Apply 1 application topically 2 (two) times daily., Disp: 30 g, Rfl: 0 .  Vitamin D, Ergocalciferol, (DRISDOL) 50000 units CAPS capsule, Take 1 capsule (50,000 Units total) by mouth every 7 (seven) days., Disp: 8 capsule, Rfl: 0  Physical exam:  Vitals:   08/04/16 1336  BP: 134/78  Pulse: 91  Resp: 16  Temp: 97.7 F (36.5 C)  TempSrc: Tympanic  Weight: 188 lb (85.3 kg)   Physical Exam  Constitutional: She is oriented to person, place, and time and well-developed, well-nourished, and in no distress.  HENT:  Head:  Normocephalic and atraumatic.  Eyes: Pupils are equal, round, and reactive to light. EOM are normal.  Neck: Normal range of motion.  Cardiovascular: Normal rate, regular rhythm and normal heart sounds.   Pulmonary/Chest: Effort normal and breath sounds normal.  Abdominal: Soft. Bowel sounds are normal.  Neurological: She is alert and oriented to person, place, and time.  Skin: Skin is warm and dry.  Urticarial rash over left forearm     CMP Latest Ref Rng & Units 08/02/2016  Glucose 65 -  99 mg/dL 88  BUN 6 - 20 mg/dL 15  Creatinine 0.44 - 1.00 mg/dL 0.73  Sodium 135 - 145 mmol/L 136  Potassium 3.5 - 5.1 mmol/L 3.5  Chloride 101 - 111 mmol/L 108  CO2 22 - 32 mmol/L 24  Calcium 8.9 - 10.3 mg/dL 9.1  Total Protein 6.5 - 8.1 g/dL 6.9  Total Bilirubin 0.3 - 1.2 mg/dL 0.5  Alkaline Phos 38 - 126 U/L 69  AST 15 - 41 U/L 16  ALT 14 - 54 U/L 11(L)   CBC Latest Ref Rng & Units 08/02/2016  WBC 3.6 - 11.0 K/uL 7.9  Hemoglobin 12.0 - 16.0 g/dL 12.7  Hematocrit 35.0 - 47.0 % 36.5  Platelets 150 - 440 K/uL 297     Assessment and plan- Patient is a 46 y.o. female with iron deficiency anemia secondary to gastric bypass.  Recent CBC from 08/02/2016 showed white count of 7.9, H&H of 12.7/36.5 and a platelet count of 297. HER-2 was normal at 146. Iron studies were within normal limits. She does not need IV iron at this time. I will repeat CBC and ferritin and iron studies in 6 months time for day or 2 prior to her visit with me. Also check B12 and folate at that time    Visit Diagnosis 1. Iron deficiency   2. S/P gastric bypass      Dr. Randa Evens, MD, MPH [facility] Pager- [phone] 08/04/2016 2:04 PM

## 2016-08-14 ENCOUNTER — Ambulatory Visit: Payer: Commercial Managed Care - PPO | Admitting: Family Medicine

## 2016-08-25 ENCOUNTER — Other Ambulatory Visit: Payer: Self-pay | Admitting: Family Medicine

## 2016-08-25 DIAGNOSIS — E669 Obesity, unspecified: Secondary | ICD-10-CM

## 2016-08-25 NOTE — Telephone Encounter (Signed)
Last OV 07/18/16 last filled 07/14/16 20 0rf

## 2016-08-25 NOTE — Telephone Encounter (Signed)
Refill sent to pharmacy. If she is still requiring the triamcinolone she'll need to follow-up for reevaluation. Please see why she continues to need Zofran. If she has had persistent nausea we will need to discontinue the Saxenda.

## 2016-08-25 NOTE — Telephone Encounter (Signed)
OK to fill Zofran and triamcinolone?

## 2016-08-25 NOTE — Telephone Encounter (Signed)
Tried to reach patient by phone no answer and Mailbox is full.

## 2016-08-28 NOTE — Telephone Encounter (Signed)
It looks like this was just refilled, 08/25/16, last OV was 07/18/16, Please advise, thanks

## 2016-08-30 MED ORDER — TRIAMCINOLONE ACETONIDE 0.1 % EX CREA: 1.0000 "application " | TOPICAL_CREAM | Freq: Two times a day (BID) | CUTANEOUS | 0 refills | Status: DC

## 2016-08-30 NOTE — Telephone Encounter (Signed)
Refill sent to pharmacy. Please see if she is continuing issues that require this. If she is she may need to see a dermatologist. Thanks.

## 2016-09-18 ENCOUNTER — Ambulatory Visit: Payer: Commercial Managed Care - PPO | Admitting: Family Medicine

## 2016-10-04 ENCOUNTER — Telehealth: Payer: Self-pay | Admitting: Family Medicine

## 2016-10-04 NOTE — Telephone Encounter (Signed)
Pt called c/o chronic hives for about 3 weeks. Pt states that she has been taking benadryl and zytrec. Pt states that she noticed that her lip is starting to swell but other wise ok. Advised pt to go to UC to get check out.

## 2016-10-04 NOTE — Telephone Encounter (Signed)
Agree with urgent care evaluation

## 2016-10-04 NOTE — Telephone Encounter (Signed)
Called patient and she stated she was on the way to urgent care to be evaluated. She has seen her endocrinologist and they recommended her be seen by a dermatologist.

## 2017-02-01 ENCOUNTER — Telehealth: Payer: Self-pay | Admitting: *Deleted

## 2017-02-01 NOTE — Telephone Encounter (Signed)
-----   Message from Wilburn Cornelia sent at 02/01/2017  2:30 PM EST ----- Regarding: canc appts? Pt called to cancel-declie R/S said getting albs at other dr and they'll check iron levels

## 2017-02-01 NOTE — Telephone Encounter (Signed)
Putting in note that patient has informed the office that she wants to wait til summer to see md

## 2017-02-02 ENCOUNTER — Inpatient Hospital Stay: Payer: Commercial Managed Care - PPO | Admitting: Oncology

## 2017-02-02 ENCOUNTER — Inpatient Hospital Stay: Payer: Commercial Managed Care - PPO

## 2017-02-04 NOTE — Telephone Encounter (Signed)
My note was intended to say that pt has declined to r/s any future appts. She is getting labs done at another md office. She did not leave a message stating she would come in the summer.

## 2017-03-13 ENCOUNTER — Encounter: Payer: Self-pay | Admitting: Family Medicine

## 2017-03-13 ENCOUNTER — Other Ambulatory Visit: Payer: Self-pay

## 2017-03-13 ENCOUNTER — Ambulatory Visit (INDEPENDENT_AMBULATORY_CARE_PROVIDER_SITE_OTHER): Payer: Commercial Managed Care - PPO | Admitting: Family Medicine

## 2017-03-13 VITALS — BP 130/74 | HR 82 | Temp 97.8°F | Ht 65.5 in | Wt 205.0 lb

## 2017-03-13 DIAGNOSIS — E039 Hypothyroidism, unspecified: Secondary | ICD-10-CM

## 2017-03-13 DIAGNOSIS — Z1239 Encounter for other screening for malignant neoplasm of breast: Secondary | ICD-10-CM

## 2017-03-13 DIAGNOSIS — L509 Urticaria, unspecified: Secondary | ICD-10-CM | POA: Diagnosis not present

## 2017-03-13 DIAGNOSIS — Z9884 Bariatric surgery status: Secondary | ICD-10-CM | POA: Diagnosis not present

## 2017-03-13 DIAGNOSIS — Z1231 Encounter for screening mammogram for malignant neoplasm of breast: Secondary | ICD-10-CM

## 2017-03-13 DIAGNOSIS — Z23 Encounter for immunization: Secondary | ICD-10-CM | POA: Diagnosis not present

## 2017-03-13 DIAGNOSIS — Z0001 Encounter for general adult medical examination with abnormal findings: Secondary | ICD-10-CM

## 2017-03-13 DIAGNOSIS — Z13220 Encounter for screening for lipoid disorders: Secondary | ICD-10-CM

## 2017-03-13 DIAGNOSIS — E6609 Other obesity due to excess calories: Secondary | ICD-10-CM | POA: Diagnosis not present

## 2017-03-13 DIAGNOSIS — D508 Other iron deficiency anemias: Secondary | ICD-10-CM | POA: Diagnosis not present

## 2017-03-13 DIAGNOSIS — E66811 Obesity, class 1: Secondary | ICD-10-CM

## 2017-03-13 DIAGNOSIS — Z6834 Body mass index (BMI) 34.0-34.9, adult: Secondary | ICD-10-CM

## 2017-03-13 LAB — COMPREHENSIVE METABOLIC PANEL
ALBUMIN: 4.1 g/dL (ref 3.5–5.2)
ALK PHOS: 78 U/L (ref 39–117)
ALT: 9 U/L (ref 0–35)
AST: 14 U/L (ref 0–37)
BILIRUBIN TOTAL: 0.4 mg/dL (ref 0.2–1.2)
BUN: 13 mg/dL (ref 6–23)
CALCIUM: 9.3 mg/dL (ref 8.4–10.5)
CO2: 28 mEq/L (ref 19–32)
CREATININE: 0.72 mg/dL (ref 0.40–1.20)
Chloride: 106 mEq/L (ref 96–112)
GFR: 91.52 mL/min (ref >60.00)
Glucose, Bld: 88 mg/dL (ref 70–99)
Potassium: 3.6 mEq/L (ref 3.5–5.1)
Sodium: 139 mEq/L (ref 135–145)
TOTAL PROTEIN: 6.9 g/dL (ref 6.0–8.3)

## 2017-03-13 LAB — CBC WITH DIFFERENTIAL/PLATELET
BASOS ABS: 0 10*3/uL (ref 0.0–0.1)
BASOS PCT: 0.3 % (ref 0.0–3.0)
EOS PCT: 1.5 % (ref 0.0–5.0)
Eosinophils Absolute: 0.1 10*3/uL (ref 0.0–0.7)
HEMATOCRIT: 39.7 % (ref 36.0–46.0)
Hemoglobin: 13.5 g/dL (ref 12.0–15.0)
LYMPHS PCT: 30.8 % (ref 12.0–46.0)
Lymphs Abs: 2.3 10*3/uL (ref 0.7–4.0)
MCHC: 34 g/dL (ref 30.0–36.0)
MCV: 98.3 fl (ref 78.0–100.0)
MONOS PCT: 6.3 % (ref 3.0–12.0)
Monocytes Absolute: 0.5 10*3/uL (ref 0.1–1.0)
NEUTROS ABS: 4.5 10*3/uL (ref 1.4–7.7)
Neutrophils Relative %: 61.1 % (ref 43.0–77.0)
Platelets: 338 10*3/uL (ref 150.0–400.0)
RBC: 4.04 Mil/uL (ref 3.87–5.11)
RDW: 12.7 % (ref 11.5–15.5)
WBC: 7.3 10*3/uL (ref 4.0–10.5)

## 2017-03-13 LAB — VITAMIN B12: Vitamin B-12: 206 pg/mL — ABNORMAL LOW (ref 211–911)

## 2017-03-13 LAB — FOLATE: Folate: 8.9 ng/mL (ref >5.9)

## 2017-03-13 LAB — LIPID PANEL
CHOL/HDL RATIO: 3
CHOLESTEROL: 177 mg/dL (ref 0–200)
HDL: 55.6 mg/dL (ref >39.00)
LDL Cholesterol: 101 mg/dL — ABNORMAL HIGH (ref 0–99)
NonHDL: 121.86
TRIGLYCERIDES: 102 mg/dL (ref 0.0–149.0)
VLDL: 20.4 mg/dL (ref 0.0–40.0)

## 2017-03-13 LAB — TSH: TSH: 3.06 u[IU]/mL (ref 0.35–4.50)

## 2017-03-13 LAB — VITAMIN D 25 HYDROXY (VIT D DEFICIENCY, FRACTURES): VITD: 16.8 ng/mL — ABNORMAL LOW (ref 30.00–100.00)

## 2017-03-13 NOTE — Assessment & Plan Note (Signed)
Plan to recheck labs.  She is deferred follow-up with hematology at this time and we can refer back if iron studies or CBC returns abnormal.

## 2017-03-13 NOTE — Assessment & Plan Note (Signed)
Patient has gained weight again.  Suspect this is a combination of inactivity as well as being on steroids.  She started back on saxenda and her weight has not come down.  I discussed given that there is been no weight change she should discontinue this medication.  She will start working on exercise.  She will start working on dietary changes as well.

## 2017-03-13 NOTE — Assessment & Plan Note (Signed)
Has resolved at this time.  I encouraged her to keep her appointment with her allergist.

## 2017-03-13 NOTE — Progress Notes (Signed)
Tommi Rumps, MD Phone: [phone]  Whitney Hill is a 47 y.o. female who presents today for physical exam.  Not exercising. Diet is the same as it was before.  She did stop eating cheese.  She started herself back on saxenda after stopping it.  She has not lost any weight with this. Most recent Pap smear with non-high risk HPV.  She is due for follow-up with gynecology.  She continues to have irregular menstrual cycles every 3-4 months.  This has been a chronic issue. She due for mammogram. Due for flu shot and tetanus vaccination. No tobacco use, alcohol use, or illicit drug use.  Does report she suffered with hives for 4 months.  She ended up seeing endocrinology who sent her to an allergist.  She has continued to follow with endocrinology for her hypothyroidism and they are trying to push her TSH lower.  They did an ultrasound of her thyroid which she states revealed a shriveled up thyroid.  No nodules noted per her report.  She was treated with steroids for her hives.  She eventually was placed on Xolair and one treatment resolved the symptoms.  She has not had any recurrence.  Active Ambulatory Problems    Diagnosis Date Noted  . Hypothyroidism 10/01/2014  . Left knee pain 10/01/2014  . Cramps, extremity 10/01/2014  . Allergic rhinitis 10/01/2014  . S/P gastric bypass 10/01/2014  . Iron deficiency anemia 01/14/2015  . Plantar fasciitis 05/13/2015  . Knee osteochondritis dessicans 05/25/2015  . Status post left partial knee replacement, medial aspect 07/09/2015  . Obesity 07/09/2015  . Skin nodule 09/20/2015  . Encounter for general adult medical examination with abnormal findings 10/14/2015  . Late period 11/24/2015  . Iron deficiency 02/04/2016  . Abnormal uterine bleeding 02/07/2016  . Constipation 04/28/2016  . Right ankle pain 04/28/2016  . Candidal intertrigo 07/18/2016  . Hives 03/13/2017   Resolved Ambulatory Problems    Diagnosis Date Noted  . Surgery,  elective, encounter for clearance 01/14/2015  . Preoperative evaluation to rule out surgical contraindication 04/12/2015  . Acute upper respiratory infection 11/24/2015  . Bug bites 07/18/2016   Past Medical History:  Diagnosis Date  . Arthritis   . Complication of anesthesia   . DVT (deep venous thrombosis) ([place])   . Gastric ulcer   . Headache   . Heterozygous factor V Leiden mutation ([place])   . Hypothyroid   . Iron deficiency anemia     Family History  Problem Relation Age of Onset  . Arthritis Unknown        parent  . Breast cancer Unknown        aunt, grandmother  . Hyperlipidemia Unknown        parent  . Stroke Unknown        parent  . Hypertension Unknown        parent  . Kidney disease Unknown        parent, grandparent  . Diabetes Unknown        parent, grandparent  . Renal cancer Unknown        parent  . Breast cancer Maternal Aunt 27       BRACA + dx twice age 82 and 53  . Breast cancer Maternal Grandmother        late 40's    Social History   Socioeconomic History  . Marital status: Married    Spouse name: Not on file  . Number of children: Not on file  .  Years of education: Not on file  . Highest education level: Not on file  Social Needs  . Financial resource strain: Not on file  . Food insecurity - worry: Not on file  . Food insecurity - inability: Not on file  . Transportation needs - medical: Not on file  . Transportation needs - non-medical: Not on file  Occupational History  . Not on file  Tobacco Use  . Smoking status: Never Smoker  . Smokeless tobacco: Never Used  Substance and Sexual Activity  . Alcohol use: No    Alcohol/week: 0.0 oz  . Drug use: No  . Sexual activity: Not on file  Other Topics Concern  . Not on file  Social History Narrative  . Not on file    ROS  General:  Negative for nexplained weight loss, fever Skin: Negative for new or changing mole, sore that won't heal HEENT: Negative for trouble hearing, trouble  seeing, ringing in ears, mouth sores, hoarseness, change in voice, dysphagia. CV:  Negative for chest pain, dyspnea, edema, palpitations Resp: Negative for cough, dyspnea, hemoptysis GI: Negative for nausea, vomiting, diarrhea, constipation, abdominal pain, melena, hematochezia. GU: Negative for dysuria, incontinence, urinary hesitance, hematuria, vaginal or penile discharge, polyuria, sexual difficulty, lumps in testicle or breasts MSK: Negative for muscle cramps or aches, joint pain or swelling Neuro: Negative for headaches, weakness, numbness, dizziness, passing out/fainting Psych: Negative for depression, anxiety, memory problems  Objective  Physical Exam Vitals:   03/13/17 0929  BP: 130/74  Pulse: 82  Temp: 97.8 F (36.6 C)  SpO2: 96%    BP Readings from Last 3 Encounters:  03/13/17 130/74  08/04/16 134/78  07/18/16 110/80   Wt Readings from Last 3 Encounters:  03/13/17 205 lb (93 kg)  08/04/16 188 lb (85.3 kg)  07/18/16 187 lb 9.6 oz (85.1 kg)    Physical Exam  Constitutional: No distress.  HENT:  Head: Normocephalic and atraumatic.  Mouth/Throat: Oropharynx is clear and moist. No oropharyngeal exudate.  Eyes: Conjunctivae are normal. Pupils are equal, round, and reactive to light.  Neck: Neck supple.  Cardiovascular: Normal rate, regular rhythm and normal heart sounds.  Pulmonary/Chest: Effort normal and breath sounds normal.  Abdominal: Soft. Bowel sounds are normal. She exhibits no distension. There is no tenderness. There is no rebound and no guarding.  Musculoskeletal: She exhibits no edema.  Lymphadenopathy:    She has no cervical adenopathy.  Neurological: She is alert. Gait normal.  Skin: Skin is warm and dry. She is not diaphoretic.     Assessment/Plan:   Encounter for general adult medical examination with abnormal findings Physical exam completed.  Pelvic exam and breast exam deferred to gynecology.  Patient stated that she would contact  gynecology to set up an appointment.  Discussed that she needed to do this to follow-up on her prior abnormal Pap smear.  Mammogram is ordered.  Flu shot and tetanus vaccination given.  Lab work as outlined below.  Iron deficiency anemia Plan to recheck labs.  She is deferred follow-up with hematology at this time and we can refer back if iron studies or CBC returns abnormal.  S/P gastric bypass Patient needs gastric bypass labs.  Obesity Patient has gained weight again.  Suspect this is a combination of inactivity as well as being on steroids.  She started back on saxenda and her weight has not come down.  I discussed given that there is been no weight change she should discontinue this medication.  She will  start working on exercise.  She will start working on dietary changes as well.  Hypothyroidism Check TSH.  Continue to follow with endocrinology.  Hives Has resolved at this time.  I encouraged her to keep her appointment with her allergist.   Orders Placed This Encounter  Procedures  . MM SCREENING BREAST TOMO BILATERAL    Standing Status:   Future    Standing Expiration Date:   05/14/2018    Order Specific Question:   Reason for Exam (SYMPTOM  OR DIAGNOSIS REQUIRED)    Answer:   breast cancer screening    Order Specific Question:   Is the patient pregnant?    Answer:   No    Order Specific Question:   Preferred imaging location?    Answer:   [facility]  . Td : Tetanus/diphtheria >7yo Preservative  free  . Flu Vaccine QUAD 36+ mos IM  . Comp Met (CMET)  . Lipid panel  . CBC w/Diff  . Iron, TIBC and Ferritin Panel  . B12  . Folate  . Vitamin B1  . Ceruloplasmin  . PTH, intact (no Ca)  . Vitamin D (25 hydroxy)  . TSH    No orders of the defined types were placed in this encounter.    Tommi Rumps, MD [place]

## 2017-03-13 NOTE — Assessment & Plan Note (Signed)
Check TSH.  Continue to follow with endocrinology.

## 2017-03-13 NOTE — Patient Instructions (Addendum)
Nice to see you. Please contact her gynecologist for follow-up. We will check lab work today and contact you with the results. We will get a mammogram set up. Please work on diet and exercise.

## 2017-03-13 NOTE — Assessment & Plan Note (Signed)
Physical exam completed.  Pelvic exam and breast exam deferred to gynecology.  Patient stated that she would contact gynecology to set up an appointment.  Discussed that she needed to do this to follow-up on her prior abnormal Pap smear.  Mammogram is ordered.  Flu shot and tetanus vaccination given.  Lab work as outlined below.

## 2017-03-13 NOTE — Assessment & Plan Note (Signed)
Patient needs gastric bypass labs.

## 2017-03-14 LAB — PARATHYROID HORMONE, INTACT (NO CA): PTH: 101 pg/mL — AB (ref 14–64)

## 2017-03-16 ENCOUNTER — Other Ambulatory Visit: Payer: Self-pay | Admitting: Family Medicine

## 2017-03-16 MED ORDER — CYANOCOBALAMIN 1000 MCG/ML IJ SOLN: 1000.0000 ug | INTRAMUSCULAR | 3 refills | Status: DC

## 2017-03-17 LAB — IRON,TIBC AND FERRITIN PANEL
%SAT: 43 % (ref 11–50)
FERRITIN: 105 ng/mL (ref 10–232)
Iron: 127 ug/dL (ref 40–190)
TIBC: 296 mcg/dL (calc) (ref 250–450)

## 2017-03-17 LAB — VITAMIN B1: Vitamin B1 (Thiamine): 7 nmol/L — ABNORMAL LOW (ref 8–30)

## 2017-03-17 LAB — CERULOPLASMIN: CERULOPLASMIN: 33 mg/dL (ref 18–53)

## 2017-03-22 ENCOUNTER — Other Ambulatory Visit: Payer: Self-pay | Admitting: Family Medicine

## 2017-03-22 DIAGNOSIS — E519 Thiamine deficiency, unspecified: Secondary | ICD-10-CM

## 2017-03-26 ENCOUNTER — Encounter: Payer: Self-pay | Admitting: Family Medicine

## 2017-03-27 ENCOUNTER — Encounter: Payer: Self-pay | Admitting: Radiology

## 2017-03-27 ENCOUNTER — Ambulatory Visit
Admission: RE | Admit: 2017-03-27 | Discharge: 2017-03-27 | Disposition: A | Discharge status: Home / Self Care | Payer: Commercial Managed Care - PPO | Source: Ambulatory Visit | Attending: Family Medicine | Admitting: Family Medicine

## 2017-03-27 DIAGNOSIS — Z1231 Encounter for screening mammogram for malignant neoplasm of breast: Secondary | ICD-10-CM | POA: Diagnosis not present

## 2017-03-27 DIAGNOSIS — Z1239 Encounter for other screening for malignant neoplasm of breast: Secondary | ICD-10-CM

## 2017-04-17 ENCOUNTER — Encounter: Payer: Self-pay | Admitting: Family Medicine

## 2017-04-19 MED ORDER — CYANOCOBALAMIN 1000 MCG/ML IJ SOLN: 1000.0000 ug | INTRAMUSCULAR | 3 refills | Status: DC

## 2017-04-23 ENCOUNTER — Other Ambulatory Visit: Payer: Commercial Managed Care - PPO

## 2017-06-14 ENCOUNTER — Ambulatory Visit: Payer: Commercial Managed Care - PPO | Admitting: Family Medicine

## 2017-07-30 ENCOUNTER — Ambulatory Visit: Payer: Self-pay | Admitting: Family Medicine

## 2017-07-30 NOTE — Progress Notes (Signed)
Subjective:    Patient ID: Whitney Hill, female    DOB: Jul 24, 1970, 47 y.o.   MRN: [id number]  HPI  Whitney Hill is a 47 year old female who presents today with bilateral ankle edema that started 5 days ago.  She has noticed mosquito bites when edema but thought that edema was likely present prior to noticing bites.   Edema presented: Feet and ankles bilaterally  Fever: No Pain: No Erythema: Yes and it is improving Warmth: No Pruritis: Yes, it has improved Dyspnea: No Orthopnea: No PND: No Fatigue: No Anorexia: No Abdominal pain: No Pertinent History: allergic rhinitis, hives where she was evaluated by an allergist.  Treatment: Soaked in epsom salts, elevated feet, and Zyrtec has  provided excellent benefit.  She reports that she was outside for a party at her home and noted mosquito bites that occurred around her ankles. She states that she does have "extemely bad" local reactions to insect bites. Patient provided photos of ankles when symptoms first started that showed edema and erythema around ankle area.  She reports having trouble with UTIs where she has been evaluated by a teledoc per patient. She completed an antibiotic approximately one month ago. She has taken AZO this past weekend for symptoms of frequency and burning. She denies  hematuria, urgency, frequency, polyuria, or nocturia. No flank pain. No fever, chills, sweats.  She has experienced some intermittent mild dysuria that has improved with AZO. She would like her urine checked today as she is concerned that the infection may not have "cleared"  She denies change in diet and denies salty food intake.   Review of Systems  Constitutional: Negative for chills, fatigue and fever.  Respiratory: Negative for cough, shortness of breath and wheezing.   Cardiovascular: Negative for chest pain and palpitations.  Gastrointestinal: Negative for abdominal pain, diarrhea, nausea and vomiting.  Genitourinary: Positive for  dysuria. Negative for frequency, hematuria and urgency.  Musculoskeletal: Negative for myalgias.       Ankle swelling   Skin: Negative for rash.  Neurological: Negative for dizziness, light-headedness and headaches.  Psychiatric/Behavioral:       Denies depressed or anxious mood today   Past Medical History:  Diagnosis Date  . Arthritis    OA  . Complication of anesthesia    "ITCHING TO FACE, BENADRYL HELP BUT DROPS BLOOD PRESSURE"  . DVT (deep venous thrombosis) ([place])    following first knee surgery in 2011  . Gastric ulcer   . Headache   . Heterozygous factor V Leiden mutation ([place])   . Hypothyroid   . Iron deficiency anemia      Social History   Socioeconomic History  . Marital status: Married    Spouse name: Not on file  . Number of children: Not on file  . Years of education: Not on file  . Highest education level: Not on file  Occupational History  . Not on file  Social Needs  . Financial resource strain: Not on file  . Food insecurity:    Worry: Not on file    Inability: Not on file  . Transportation needs:    Medical: Not on file    Non-medical: Not on file  Tobacco Use  . Smoking status: Never Smoker  . Smokeless tobacco: Never Used  Substance and Sexual Activity  . Alcohol use: No    Alcohol/week: 0.0 oz  . Drug use: No  . Sexual activity: Not on file  Lifestyle  . Physical activity:  Days per week: Not on file    Minutes per session: Not on file  . Stress: Not on file  Relationships  . Social connections:    Talks on phone: Not on file    Gets together: Not on file    Attends religious service: Not on file    Active member of club or organization: Not on file    Attends meetings of clubs or organizations: Not on file    Relationship status: Not on file  . Intimate partner violence:    Fear of current or ex partner: Not on file    Emotionally abused: Not on file    Physically abused: Not on file    Forced sexual activity: Not on file    Other Topics Concern  . Not on file  Social History Narrative  . Not on file    Past Surgical History:  Procedure Laterality Date  . [place]  . CHOLECYSTECTOMY     1998  . [place], 2001  . GASTRIC BYPASS     2012, revision 2013  . KNEE ARTHROSCOPY     2011, 2013, 2015  . TOOTH EXTRACTION     2010  . TOTAL KNEE ARTHROPLASTY Left 05/25/2015   Procedure: LEFT TOTAL KNEE ARTHROPLASTY;  Surgeon: Renette Butters, MD;  Location: [facility];  Service: Orthopedics;  Laterality: Left;  . TUBAL LIGATION     2004    Family History  Problem Relation Age of Onset  . Arthritis Unknown        parent  . Hyperlipidemia Unknown        parent  . Stroke Unknown        parent  . Hypertension Unknown        parent  . Kidney disease Unknown        parent, grandparent  . Diabetes Unknown        parent, grandparent  . Renal cancer Unknown        parent  . Breast cancer Maternal Aunt 59       BRACA + dx twice age 75 and 70  . Breast cancer Maternal Grandmother        late 50's    Allergies  Allergen Reactions  . Paxil [Paroxetine Hcl] Anaphylaxis    Throat swelled shut  . Penicillins Hives    Current Outpatient Medications on File Prior to Visit  Medication Sig Dispense Refill  . B Complex Vitamins (B COMPLEX-B12 PO) Take 1 tablet by mouth daily.     . clindamycin (CLEOCIN) 150 MG capsule Take 150 mg by mouth as needed. Prior to dental procedures  2  . cyanocobalamin (,VITAMIN B-12,) 1000 MCG/ML injection Inject 1 mL (1,000 mcg total) into the muscle every 30 (thirty) days. 3 mL 3  . EPINEPHrine 0.3 mg/0.3 mL IJ SOAJ injection Inject into the muscle.    . multivitamin-iron-minerals-folic acid (CENTRUM) chewable tablet Chew 1 tablet by mouth daily.    Marland Kitchen nystatin (NYSTATIN) powder Apply topically 2 (two) times daily. 15 g 0  . Thyroid 97.5 MG TABS Take 1 tablet (97.5 mg total) by mouth daily. (Patient taking differently:  Take 120 mg by mouth daily. ) 90 tablet 1  . triamcinolone cream (KENALOG) 0.1 % Apply 1 application topically 2 (two) times daily. 30 g 0  . Vitamin D, Ergocalciferol, (DRISDOL) 50000 units CAPS capsule Take 1 capsule (50,000 Units  total) by mouth every 7 (seven) days. 8 capsule 0  . Liraglutide -Weight Management (SAXENDA) 18 MG/3ML SOPN Inject 3 mg into the skin daily. 45 mL 0   No current facility-administered medications on file prior to visit.     BP 118/78 (BP Location: Left Arm, Patient Position: Sitting, Cuff Size: Normal)   Pulse 74   Temp 98.5 F (36.9 C) (Oral)   Resp 15   Wt 188 lb 6 oz (85.4 kg)   SpO2 99%   BMI 30.87 kg/m       Objective:   Physical Exam  Constitutional: She is oriented to person, place, and time. She appears well-developed and well-nourished.  Eyes: Pupils are equal, round, and reactive to light. No scleral icterus.  Neck: Neck supple.  Cardiovascular: Normal rate, regular rhythm and intact distal pulses.  Pulmonary/Chest: Effort normal and breath sounds normal. She has no wheezes. She has no rales.  Abdominal: Soft. Bowel sounds are normal. There is no tenderness. There is no CVA tenderness.  No suprapubic tenderness present  Musculoskeletal:  Very mild nonpitting edema present in ankles bilaterally. No erythema or warmth present. DP and PT pulses 2+. Resolving mosquito bites are present on ankles bilaterally.   Lymphadenopathy:    She has no cervical adenopathy.  Neurological: She is alert and oriented to person, place, and time.  Skin: Skin is warm and dry. Capillary refill takes less than 2 seconds. No erythema.  Psychiatric: She has a normal mood and affect. Her behavior is normal. Judgment and thought content normal.       Assessment & Plan:  1. Ankle edema, bilateral Improved with elevation and soaking with epsom salts. She also added Zyrtec with history of reactions to mosquito bites that were similar and localized. No erythema,  warmth, or edema present today. Suspect that mosquito bites and weight with standing for extended period of time are contributing to symptom that has improved and resolution of prior erythema and pruritus noted from photos provided by patient. We discussed monitoring diet and avoid salt in diet. Also, will check BMP and liver function today. Advised elevation and provided written directions and return precautions. She will follow up with PCP as she stated she missed her last appointment and is now due.   2. Dysuria Improved; she has completed antibiotic provided by teledoc. UA is unremarkable today. Advised patient to follow up if she symptom of dysuria does not continue to improve, worsen, or new symptoms develop, particularly fever or flank pain.  Delano Metz, FNP-C

## 2017-07-30 NOTE — Telephone Encounter (Signed)
Pt c/o bilateral ankle edema and left palm and finger edema. She stated that her feet cannot fit into her shoes and that she is wearing flip flops. She stated that her edema is limited to her ankle and feet only.  She stated her right hand edema started last night.  Pt denies calf pain, fever, chest pain,difficulty breathing. Pt is c/o itching is worse after mosquito bites. Appt made with Delano Metz tomorrow. Care advice given. Reason for Disposition . [1] MILD swelling of both ankles (i.e., pedal edema) AND [2] new onset or worsening . [1] MODERATE pain (e.g., interferes with normal activities, limping) AND [2] present > 3 days  Answer Assessment - Initial Assessment Questions 1. LOCATION: "Which joint is swollen?"     Ankle and fingers and palm area 2. ONSET: "When did the swelling start?"     Hand: last night ankle: Friday 3. SIZE: "How large is the swelling?"     Swelling  prevents her from wearing shoes except for flip flops 4. PAIN: "Is there any pain?" If so, ask: "How bad is it?" (Scale 1-10; or mild, moderate, severe)    none 5. CAUSE: "What do you think caused the swollen joint?"    Maybe the heat and mosquito bite. Swelling came first before the mosquito bite 6. OTHER SYMPTOMS: "Do you have any other symptoms?" (e.g., fever, chest pain, difficulty breathing, calf pain)     No, itching 7. PREGNANCY: "Is there any chance you are pregnant?" "When was your last menstrual period?"     No- irregular 2 months ago.  Answer Assessment - Initial Assessment Questions 1. ONSET: "When did the swelling start?" (e.g., minutes, hours, days)     Friday 2. LOCATION: "What part of the leg is swollen?"  "Are both legs swollen or just one leg?"     Ankle and foot and toes 3. SEVERITY: "How bad is the swelling?" (e.g., localized; mild, moderate, severe)  - Localized - small area of swelling localized to one leg  - MILD pedal edema - swelling limited to foot and ankle, pitting edema < 1/4 inch  (6 mm) deep, rest and elevation eliminate most or all swelling  - MODERATE edema - swelling of lower leg to knee, pitting edema > 1/4 inch (6 mm) deep, rest and elevation only partially reduce swelling  - SEVERE edema - swelling extends above knee, facial or hand swelling present      mild 4. REDNESS: "Does the swelling look red or infected?"     no 5. PAIN: "Is the swelling painful to touch?" If so, ask: "How painful is it?"   (Scale 1-10; mild, moderate or severe)     No painful 6. FEVER: "Do you have a fever?" If so, ask: "What is it, how was it measured, and when did it start?"      no 7. CAUSE: "What do you think is causing the leg swelling?"     Heat  8. MEDICAL HISTORY: "Do you have a history of heart failure, kidney disease, liver failure, or cancer?"     no 9. RECURRENT SYMPTOM: "Have you had leg swelling before?" If so, ask: "When was the last time?" "What happened that time?"     no 10. OTHER SYMPTOMS: "Do you have any other symptoms?" (e.g., chest pain, difficulty breathing)       no 11. PREGNANCY: "Is there any chance you are pregnant?" "When was your last menstrual period?"       N/a- 2 months  ago  Protocols used: LEG SWELLING AND EDEMA-A-AH, ANKLE SWELLING-A-AH

## 2017-07-31 ENCOUNTER — Encounter: Payer: Self-pay | Admitting: Family Medicine

## 2017-07-31 ENCOUNTER — Other Ambulatory Visit: Payer: Self-pay | Admitting: Family Medicine

## 2017-07-31 ENCOUNTER — Ambulatory Visit: Payer: Commercial Managed Care - PPO | Admitting: Family Medicine

## 2017-07-31 ENCOUNTER — Telehealth: Payer: Self-pay | Admitting: Family Medicine

## 2017-07-31 VITALS — BP 118/78 | HR 74 | Temp 98.5°F | Resp 15 | Wt 188.4 lb

## 2017-07-31 DIAGNOSIS — E538 Deficiency of other specified B group vitamins: Secondary | ICD-10-CM

## 2017-07-31 DIAGNOSIS — R300 Dysuria: Secondary | ICD-10-CM | POA: Diagnosis not present

## 2017-07-31 DIAGNOSIS — M25471 Effusion, right ankle: Secondary | ICD-10-CM

## 2017-07-31 DIAGNOSIS — E876 Hypokalemia: Secondary | ICD-10-CM

## 2017-07-31 DIAGNOSIS — M25472 Effusion, left ankle: Secondary | ICD-10-CM | POA: Diagnosis not present

## 2017-07-31 DIAGNOSIS — E519 Thiamine deficiency, unspecified: Secondary | ICD-10-CM

## 2017-07-31 LAB — BASIC METABOLIC PANEL
BUN: 13 mg/dL (ref 6–23)
CALCIUM: 9 mg/dL (ref 8.4–10.5)
CO2: 29 meq/L (ref 19–32)
Chloride: 104 mEq/L (ref 96–112)
Creatinine, Ser: 0.75 mg/dL (ref 0.40–1.20)
GFR: 87.17 mL/min (ref >60.00)
GLUCOSE: 75 mg/dL (ref 70–99)
Potassium: 3.1 mEq/L — ABNORMAL LOW (ref 3.5–5.1)
Sodium: 138 mEq/L (ref 135–145)

## 2017-07-31 LAB — HEPATIC FUNCTION PANEL
ALBUMIN: 4 g/dL (ref 3.5–5.2)
ALT: 13 U/L (ref 0–35)
AST: 12 U/L (ref 0–37)
Alkaline Phosphatase: 68 U/L (ref 39–117)
Bilirubin, Direct: 0.1 mg/dL (ref 0.0–0.3)
TOTAL PROTEIN: 6.7 g/dL (ref 6.0–8.3)
Total Bilirubin: 0.3 mg/dL (ref 0.2–1.2)

## 2017-07-31 LAB — POC URINALSYSI DIPSTICK (AUTOMATED)
BILIRUBIN UA: NEGATIVE
Glucose, UA: NEGATIVE
KETONES UA: NEGATIVE
LEUKOCYTES UA: NEGATIVE
NITRITE UA: NEGATIVE
PROTEIN UA: NEGATIVE
RBC UA: NEGATIVE
Spec Grav, UA: 1.015 (ref 1.010–1.025)
Urobilinogen, UA: 0.2 E.U./dL
pH, UA: 6.5 (ref 5.0–8.0)

## 2017-07-31 MED ORDER — POTASSIUM CHLORIDE ER 10 MEQ PO TBCR: 10.0000 meq | EXTENDED_RELEASE_TABLET | Freq: Every day | ORAL | 0 refills | Status: DC

## 2017-07-31 NOTE — Telephone Encounter (Signed)
Contacted patient by phone. Potassium is mildly low. Potassium 10 meq once daily x 5 days sent to pharmacy and advised recheck of potassium in one week.  Patient voiced understanding and agreed with plan.

## 2017-07-31 NOTE — Progress Notes (Signed)
Recheck of potassium in one week following supplement of 10 meq of Potassium for 5 days.

## 2017-07-31 NOTE — Patient Instructions (Signed)
Please go to the lab before you leave. We will contact you with your lab results which may be up to one week for processing. If you have not heard from [place] within one week, please call office.   Edema Edema is when you have too much fluid in your body or under your skin. Edema may make your legs, feet, and ankles swell up. Swelling is also common in looser tissues, like around your eyes. This is a common condition. It gets more common as you get older. There are many possible causes of edema. Eating too much salt (sodium) and being on your feet or sitting for a long time can cause edema in your legs, feet, and ankles. Hot weather may make edema worse. Edema is usually painless. Your skin may look swollen or shiny. Follow these instructions at home:  Keep the swollen body part raised (elevated) above the level of your heart when you are sitting or lying down.  Do not sit still or stand for a long time.  Do not wear tight clothes. Do not wear garters on your upper legs.  Exercise your legs. This can help the swelling go down.  Wear elastic bandages or support stockings as told by your doctor.  Eat a low-salt (low-sodium) diet to reduce fluid as told by your doctor.  Depending on the cause of your swelling, you may need to limit how much fluid you drink (fluid restriction).  Take over-the-counter and prescription medicines only as told by your doctor. Contact a doctor if:  Treatment is not working.  You have heart, liver, or kidney disease and have symptoms of edema.  You have sudden and unexplained weight gain. Get help right away if:  You have shortness of breath or chest pain.  You cannot breathe when you lie down.  You have pain, redness, or warmth in the swollen areas.  You have heart, liver, or kidney disease and get edema all of a sudden.  You have a fever and your symptoms get worse all of a sudden. Summary  Edema is when you have too much fluid in your body or under  your skin.  Edema may make your legs, feet, and ankles swell up. Swelling is also common in looser tissues, like around your eyes.  Raise (elevate) the swollen body part above the level of your heart when you are sitting or lying down.  Follow your doctor's instructions about diet and how much fluid you can drink (fluid restriction). This information is not intended to replace advice given to you by your health care provider. Make sure you discuss any questions you have with your health care provider. Document Released: 06/14/2007 Document Revised: 01/14/2016 Document Reviewed: 01/14/2016 Elsevier Interactive Patient Education  2017 [employer].

## 2017-08-07 NOTE — Addendum Note (Signed)
Addended by: Leone Haven on: 08/07/2017 03:23 PM   Modules accepted: Orders

## 2017-08-07 NOTE — Telephone Encounter (Addendum)
Noted patient had not been scheduled for recheck of her potassium.  I contacted her regarding this.  She notes that she cannot come in this week though can come in next week.  She is also due for recheck of B12 and thiamine levels.  She was scheduled for next week. Prior iron levels noted to be normal.  She has not required iron transfusions in some time.  She does endorse that she had an endoscopy about 2 years ago through her gastric bypass surgeon after she was noted to be iron deficient.  She was followed by hematology previously.

## 2017-08-13 ENCOUNTER — Other Ambulatory Visit (INDEPENDENT_AMBULATORY_CARE_PROVIDER_SITE_OTHER): Payer: Commercial Managed Care - PPO

## 2017-08-13 DIAGNOSIS — E519 Thiamine deficiency, unspecified: Secondary | ICD-10-CM | POA: Diagnosis not present

## 2017-08-13 DIAGNOSIS — E538 Deficiency of other specified B group vitamins: Secondary | ICD-10-CM

## 2017-08-13 DIAGNOSIS — E876 Hypokalemia: Secondary | ICD-10-CM

## 2017-08-13 LAB — VITAMIN B12: Vitamin B-12: 1505 pg/mL — ABNORMAL HIGH (ref 200–1100)

## 2017-08-13 LAB — POTASSIUM: POTASSIUM: 3.5 mmol/L (ref 3.5–5.3)

## 2017-08-13 NOTE — Addendum Note (Signed)
Addended by: Arby Barrette on: 08/13/2017 11:26 AM   Modules accepted: Orders

## 2017-08-16 LAB — VITAMIN B1: Vitamin B1 (Thiamine): <6 nmol/L — ABNORMAL LOW (ref 8–30)

## 2017-08-20 ENCOUNTER — Other Ambulatory Visit: Payer: Self-pay | Admitting: Family Medicine

## 2017-08-20 DIAGNOSIS — Z9884 Bariatric surgery status: Secondary | ICD-10-CM

## 2017-09-12 ENCOUNTER — Telehealth: Payer: Self-pay | Admitting: *Deleted

## 2017-09-12 NOTE — Telephone Encounter (Signed)
Copied from [place] [id number]. Topic: Referral - Status >> Sep 12, 2017  8:49 AM Bea Graff, NT wrote: Reason for CRM: Patient calling to check status on the referral to bariatric surgery.

## 2017-09-18 ENCOUNTER — Ambulatory Visit: Payer: Commercial Managed Care - PPO | Admitting: Internal Medicine

## 2017-09-18 ENCOUNTER — Encounter: Payer: Self-pay | Admitting: Internal Medicine

## 2017-09-18 ENCOUNTER — Ambulatory Visit: Payer: Self-pay | Admitting: *Deleted

## 2017-09-18 VITALS — BP 110/72 | HR 68 | Temp 97.9°F | Ht 65.5 in | Wt 195.4 lb

## 2017-09-18 DIAGNOSIS — D509 Iron deficiency anemia, unspecified: Secondary | ICD-10-CM

## 2017-09-18 DIAGNOSIS — M2550 Pain in unspecified joint: Secondary | ICD-10-CM

## 2017-09-18 DIAGNOSIS — E063 Autoimmune thyroiditis: Secondary | ICD-10-CM

## 2017-09-18 DIAGNOSIS — E519 Thiamine deficiency, unspecified: Secondary | ICD-10-CM

## 2017-09-18 DIAGNOSIS — W57XXXD Bitten or stung by nonvenomous insect and other nonvenomous arthropods, subsequent encounter: Secondary | ICD-10-CM

## 2017-09-18 DIAGNOSIS — H547 Unspecified visual loss: Secondary | ICD-10-CM | POA: Insufficient documentation

## 2017-09-18 DIAGNOSIS — E559 Vitamin D deficiency, unspecified: Secondary | ICD-10-CM | POA: Insufficient documentation

## 2017-09-18 DIAGNOSIS — W57XXXA Bitten or stung by nonvenomous insect and other nonvenomous arthropods, initial encounter: Secondary | ICD-10-CM | POA: Insufficient documentation

## 2017-09-18 HISTORY — DX: Autoimmune thyroiditis: E06.3

## 2017-09-18 MED ORDER — METHYLPREDNISOLONE ACETATE 40 MG/ML IJ SUSP
40.0000 mg | Freq: Once | INTRAMUSCULAR | Status: AC
  Administered 2017-09-18: 40 mg via INTRAMUSCULAR

## 2017-09-18 NOTE — Progress Notes (Signed)
Chief Complaint  Patient presents with  . Follow-up   F/u  1. C/o joint pain and swelling now in hands and feet. She had joint swelling 07/31/17 and saw NP. Now c/o pain and swelling hands and feet x 2 weeks tried old tylenol #3 Rx and Tylenol, NSAIDS contraindicated 2/2 h/o ulcer. Reviewed labs GC/C neg 03/29/17. She does report mosquito bites  Of note m aunt has rheumatoid arthritis   2. B1 def taking thiamine 200 mg qd, vitamin d def s/p gastric bypass has not heard about referral bariatric surgery from PCP  3. C/o left eye halo 09/2017 and reduced vision will refer to [facility] to further w/u if w/u negative consider MRI brain  4. C/o weakness in upper arm extremities   Review of Systems  Constitutional: Negative for fever.  HENT: Negative for hearing loss.   Eyes: Negative for blurred vision.  Respiratory: Negative for shortness of breath.   Cardiovascular: Negative for chest pain.  Musculoskeletal: Positive for joint pain.  Skin: Negative for rash.  Neurological: Positive for weakness.       +left eye halo and vision change b/l eyes Weakness Left arm   Psychiatric/Behavioral: Negative for depression.   Past Medical History:  Diagnosis Date  . Arthritis    OA  . Complication of anesthesia    "ITCHING TO FACE, BENADRYL HELP BUT DROPS BLOOD PRESSURE"  . DVT (deep venous thrombosis) ([facility])    following first knee surgery in 2011  . Gastric ulcer   . Headache   . Heterozygous factor V Leiden mutation ([place])   . Hypothyroid   . Iron deficiency anemia    Past Surgical History:  Procedure Laterality Date  . [place]  . CHOLECYSTECTOMY     1998  . [place], 2001  . GASTRIC BYPASS     2012, revision 2013  . KNEE ARTHROSCOPY     2011, 2013, 2015  . TOOTH EXTRACTION     2010  . TOTAL KNEE ARTHROPLASTY Left 05/25/2015   Procedure: LEFT TOTAL KNEE ARTHROPLASTY;  Surgeon: Renette Butters, MD;  Location: [facility];   Service: Orthopedics;  Laterality: Left;  . TUBAL LIGATION     2004   Family History  Problem Relation Age of Onset  . Arthritis Unknown        parent  . Hyperlipidemia Unknown        parent  . Stroke Unknown        parent  . Hypertension Unknown        parent  . Kidney disease Unknown        parent, grandparent  . Diabetes Unknown        parent, grandparent  . Renal cancer Unknown        parent  . Breast cancer Maternal Aunt 44       BRACA + dx twice age 8 and 19  . Breast cancer Maternal Grandmother        late 66's   Social History   Socioeconomic History  . Marital status: Married    Spouse name: Not on file  . Number of children: Not on file  . Years of education: Not on file  . Highest education level: Not on file  Occupational History  . Not on file  Social Needs  . Financial resource strain: Not on file  . Food insecurity:    Worry:  Not on file    Inability: Not on file  . Transportation needs:    Medical: Not on file    Non-medical: Not on file  Tobacco Use  . Smoking status: Never Smoker  . Smokeless tobacco: Never Used  Substance and Sexual Activity  . Alcohol use: No    Alcohol/week: 0.0 standard drinks  . Drug use: No  . Sexual activity: Not on file  Lifestyle  . Physical activity:    Days per week: Not on file    Minutes per session: Not on file  . Stress: Not on file  Relationships  . Social connections:    Talks on phone: Not on file    Gets together: Not on file    Attends religious service: Not on file    Active member of club or organization: Not on file    Attends meetings of clubs or organizations: Not on file    Relationship status: Not on file  . Intimate partner violence:    Fear of current or ex partner: Not on file    Emotionally abused: Not on file    Physically abused: Not on file    Forced sexual activity: Not on file  Other Topics Concern  . Not on file  Social History Narrative  . Not on file   Current Meds   Medication Sig  . clindamycin (CLEOCIN) 150 MG capsule Take 150 mg by mouth as needed. Prior to dental procedures  . EPINEPHrine 0.3 mg/0.3 mL IJ SOAJ injection Inject into the muscle.  . multivitamin-iron-minerals-folic acid (CENTRUM) chewable tablet Chew 1 tablet by mouth daily.  Marland Kitchen nystatin (NYSTATIN) powder Apply topically 2 (two) times daily.  . potassium chloride (K-DUR) 10 MEQ tablet Take 1 tablet (10 mEq total) by mouth daily.  . Thyroid 97.5 MG TABS Take 1 tablet (97.5 mg total) by mouth daily. (Patient taking differently: Take 120 mg by mouth daily. )  . triamcinolone cream (KENALOG) 0.1 % Apply 1 application topically 2 (two) times daily.  . Vitamin D, Ergocalciferol, (DRISDOL) 50000 units CAPS capsule Take 1 capsule (50,000 Units total) by mouth every 7 (seven) days.   Allergies  Allergen Reactions  . Paxil [Paroxetine Hcl] Anaphylaxis    Throat swelled shut  . Penicillins Hives   Recent Results (from the past 2160 hour(s))  Basic metabolic panel     Status: Abnormal   Collection Time: 07/31/17  2:16 PM  Result Value Ref Range   Sodium 138 135 - 145 mEq/L   Potassium 3.1 (L) 3.5 - 5.1 mEq/L   Chloride 104 96 - 112 mEq/L   CO2 29 19 - 32 mEq/L   Glucose, Bld 75 70 - 99 mg/dL   BUN 13 6 - 23 mg/dL   Creatinine, Ser 0.75 0.40 - 1.20 mg/dL   Calcium 9.0 8.4 - 10.5 mg/dL   GFR 87.17 >60.00 mL/min  Hepatic function panel     Status: None   Collection Time: 07/31/17  2:16 PM  Result Value Ref Range   Total Bilirubin 0.3 0.2 - 1.2 mg/dL   Bilirubin, Direct 0.1 0.0 - 0.3 mg/dL   Alkaline Phosphatase 68 39 - 117 U/L   AST 12 0 - 37 U/L   ALT 13 0 - 35 U/L   Total Protein 6.7 6.0 - 8.3 g/dL   Albumin 4.0 3.5 - 5.2 g/dL  POCT Urinalysis Dipstick (Automated)     Status: Normal   Collection Time: 07/31/17  2:17 PM  Result Value  Ref Range   Color, UA yellow    Clarity, UA cloudy    Glucose, UA Negative Negative   Bilirubin, UA negative    Ketones, UA negative    Spec  Grav, UA 1.015 1.010 - 1.025   Blood, UA negative    pH, UA 6.5 5.0 - 8.0   Protein, UA Negative Negative   Urobilinogen, UA 0.2 0.2 or 1.0 E.U./dL   Nitrite, UA negative    Leukocytes, UA Negative Negative  Vitamin B1     Status: Abnormal   Collection Time: 08/13/17 11:26 AM  Result Value Ref Range   Vitamin B1 (Thiamine) <6 (L) 8 - 30 nmol/L    Comment: Marland Kitchen Vitamin supplementation within 24 hours prior to blood draw may affect the accuracy of the results. . This test was developed and its analytical performance characteristics have been determined by [place], [place]. It has not been cleared or approved by the U.S. Food and Drug Administration. This assay has been validated pursuant to the CLIA regulations and is used for clinical purposes. .   B12     Status: Abnormal   Collection Time: 08/13/17 11:26 AM  Result Value Ref Range   Vitamin B-12 1,505 (H) 200 - 1,100 pg/mL  Potassium     Status: None   Collection Time: 08/13/17 11:26 AM  Result Value Ref Range   Potassium 3.5 3.5 - 5.3 mmol/L   Objective  Body mass index is 32.02 kg/m. Wt Readings from Last 3 Encounters:  09/18/17 195 lb 6.4 oz (88.6 kg)  07/31/17 188 lb 6 oz (85.4 kg)  03/13/17 205 lb (93 kg)   Temp Readings from Last 3 Encounters:  09/18/17 97.9 F (36.6 C) (Oral)  07/31/17 98.5 F (36.9 C) (Oral)  03/13/17 97.8 F (36.6 C) (Oral)   BP Readings from Last 3 Encounters:  09/18/17 110/72  07/31/17 118/78  03/13/17 130/74   Pulse Readings from Last 3 Encounters:  09/18/17 68  07/31/17 74  03/13/17 82    Physical Exam  Constitutional: She is oriented to person, place, and time. Vital signs are normal. She appears well-developed and well-nourished. She is cooperative.  HENT:  Head: Normocephalic and atraumatic.  Mouth/Throat: Oropharynx is clear and moist and mucous membranes are normal.  Eyes: Pupils are equal, round, and reactive to light. Conjunctivae are  normal.  Cardiovascular: Normal rate, regular rhythm and normal heart sounds.  Pulmonary/Chest: Effort normal and breath sounds normal.  Musculoskeletal: She exhibits tenderness.       Right shoulder: She exhibits tenderness.  Diffuse ttp b/l chest b/l upper back jts in arms/feet  No obvious swelling   Neurological: She is alert and oriented to person, place, and time. Gait normal.  Skin: Skin is warm, dry and intact.  Psychiatric: She has a normal mood and affect. Her speech is normal and behavior is normal. Judgment and thought content normal. Cognition and memory are normal.  Nursing note and vitals reviewed.   Assessment   1. Arthralgia and swelling ddx fibromyalgia, autoimmune, vs other r/o lyme h/o tick bite left abdomen  2. B1, Vid D def s/p gastric bypass  3. Reduced vision and left eye halo and weakness in left upper ext  4. Iron def anemia  Plan   1. Labs today  depomedrol 40 x 1 left arm  2. Check on referral with bariatric surgery  B1 200 mg qd  3. Referred to [facility]  No obvious neuro deficits on  exam today CN 2-12 grossly intact moving all 4 ext  Consider MRI brain with PCP if sx's continue  4. Check anemia panel  Provider: Dr. Olivia Mackie [facility]oc[facility]

## 2017-09-18 NOTE — Telephone Encounter (Signed)
Pt reports swelling of hands and feet, onset one week "Or longer."  States edema of feet "Not new."  States swelling is moderate," lessens as day goes on" . Hands and feet are painful at joints, 7/10.  Pt states she is most concerned about the weakness of hands; poor grip, poor fine motor. States "Cannot open jars, hold plates."  Denies fever, rash, no warmth or redness to hands or feet. Pt states she is concerned this may be related to her "B1 deficiency." Dx 08/13/17. Appt made for today with Dr. Terese Door . Care advise given per protocol.  Reason for Disposition . MODERATE hand swelling (e.g., visible swelling of hand and fingers; pitting edema)  Answer Assessment - Initial Assessment Questions 1. ONSET: "When did the swelling start?" (e.g., minutes, hours, days)     1 week ago, hands (joints) are painful 2. LOCATION: "What part of the hand is swollen?"  "Are both hands swollen or just one hand?"    Both hands 3. SEVERITY: "How bad is the swelling?" (e.g., localized; mild, moderate, severe)   - BALL OR LUMP: small ball or lump   - LOCALIZED: puffy or swollen area or patch of skin   - JOINT SWELLING: swelling of a joint   - MILD: puffiness or mild swelling of fingers or hand   - MODERATE: fingers and hand are swollen   - SEVERE: swelling of entire hand and up into forearm     Moderate 4. REDNESS: "Does the swelling look red or infected?"    No 5. PAIN: "Is the swelling painful to touch?" If so, ask: "How painful is it?"   (Scale 1-10; mild, moderate or severe)     7/10 at joints, hands and feet 6. FEVER: "Do you have a fever?" If so, ask: "What is it, how was it measured, and when did it start?"      no 7. CAUSE: "What do you think is causing the hand swelling?" (e.g., heat, insect bite, pregnancy, recent injury)     Unsure; possibly B1 deficiency dx 08/13/17 8. MEDICAL HISTORY: "Do you have a history of heart failure, kidney disease, liver failure, or cancer?"     no 9. RECURRENT  SYMPTOM: "Have you had hand swelling before?" If so, ask: "When was the last time?" "What happened that time?"     no 10. OTHER SYMPTOMS: "Do you have any other symptoms?" (e.g., blurred vision, difficulty breathing, headache)       Feet swollen and painful at joints, weakness of hands, poor grip, fine motor  Protocols used: HAND [facility]

## 2017-09-18 NOTE — Telephone Encounter (Signed)
Patient was triaged and is scheduled for appointment.

## 2017-09-18 NOTE — Progress Notes (Signed)
Pre visit review using our clinic review tool, if applicable. No additional management support is needed unless otherwise documented below in the visit note. 

## 2017-09-18 NOTE — Patient Instructions (Signed)
Joint Pain Joint pain, which is also called arthralgia, can be caused by many things. Joint pain often goes away when you follow your health care provider's instructions for relieving pain at home. However, joint pain can also be caused by conditions that require further treatment. Common causes of joint pain include:  Bruising in the area of the joint.  Overuse of the joint.  Wear and tear on the joints that occur with aging (osteoarthritis).  Various other forms of arthritis.  A buildup of a crystal form of uric acid in the joint (gout).  Infections of the joint (septic arthritis) or of the bone (osteomyelitis).  Your health care provider may recommend medicine to help with the pain. If your joint pain continues, additional tests may be needed to diagnose your condition. Follow these instructions at home: Watch your condition for any changes. Follow these instructions as directed to lessen the pain that you are feeling.  Take medicines only as directed by your health care provider.  Rest the affected area for as long as your health care provider says that you should. If directed to do so, raise the painful joint above the level of your heart while you are sitting or lying down.  Do not do things that cause or worsen pain.  If directed, apply ice to the painful area: ? Put ice in a plastic bag. ? Place a towel between your skin and the bag. ? Leave the ice on for 20 minutes, 2-3 times per day.  Wear an elastic bandage, splint, or sling as directed by your health care provider. Loosen the elastic bandage or splint if your fingers or toes become numb and tingle, or if they turn cold and blue.  Begin exercising or stretching the affected area as directed by your health care provider. Ask your health care provider what types of exercise are safe for you.  Keep all follow-up visits as directed by your health care provider. This is important.  Contact a health care provider if:  Your  pain increases, and medicine does not help.  Your joint pain does not improve within 3 days.  You have increased bruising or swelling.  You have a fever.  You lose 10 lb (4.5 kg) or more without trying. Get help right away if:  You are not able to move the joint.  Your fingers or toes become numb or they turn cold and blue. This information is not intended to replace advice given to you by your health care provider. Make sure you discuss any questions you have with your health care provider. Document Released: 12/26/2004 Document Revised: 05/28/2015 Document Reviewed: 10/07/2013 Elsevier Interactive Patient Education  2018 Elsevier Inc.  

## 2017-09-19 LAB — COMPREHENSIVE METABOLIC PANEL
ALT: 16 U/L (ref 0–35)
AST: 16 U/L (ref 0–37)
Albumin: 4.2 g/dL (ref 3.5–5.2)
Alkaline Phosphatase: 81 U/L (ref 39–117)
BUN: 19 mg/dL (ref 6–23)
CO2: 26 meq/L (ref 19–32)
Calcium: 9.2 mg/dL (ref 8.4–10.5)
Chloride: 108 mEq/L (ref 96–112)
Creatinine, Ser: 0.79 mg/dL (ref 0.40–1.20)
GFR: 82.05 mL/min (ref >60.00)
Glucose, Bld: 88 mg/dL (ref 70–99)
POTASSIUM: 3.4 meq/L — AB (ref 3.5–5.1)
Sodium: 141 mEq/L (ref 135–145)
Total Bilirubin: 0.3 mg/dL (ref 0.2–1.2)
Total Protein: 7.1 g/dL (ref 6.0–8.3)

## 2017-09-19 LAB — CBC WITH DIFFERENTIAL/PLATELET
Basophils Absolute: 0 10*3/uL (ref 0.0–0.1)
Basophils Relative: 0.6 % (ref 0.0–3.0)
EOS PCT: 3 % (ref 0.0–5.0)
Eosinophils Absolute: 0.3 10*3/uL (ref 0.0–0.7)
HCT: 38.3 % (ref 36.0–46.0)
Hemoglobin: 12.8 g/dL (ref 12.0–15.0)
LYMPHS ABS: 3.1 10*3/uL (ref 0.7–4.0)
Lymphocytes Relative: 36.5 % (ref 12.0–46.0)
MCHC: 33.5 g/dL (ref 30.0–36.0)
MCV: 96.8 fl (ref 78.0–100.0)
Monocytes Absolute: 0.6 10*3/uL (ref 0.1–1.0)
Monocytes Relative: 7.5 % (ref 3.0–12.0)
NEUTROS ABS: 4.4 10*3/uL (ref 1.4–7.7)
NEUTROS PCT: 52.4 % (ref 43.0–77.0)
PLATELETS: 335 10*3/uL (ref 150.0–400.0)
RBC: 3.96 Mil/uL (ref 3.87–5.11)
RDW: 13 % (ref 11.5–15.5)
WBC: 8.4 10*3/uL (ref 4.0–10.5)

## 2017-09-19 LAB — TSH: TSH: 3.77 u[IU]/mL (ref 0.35–4.50)

## 2017-09-19 LAB — SEDIMENTATION RATE: Sed Rate: 16 mm/hr (ref 0–20)

## 2017-09-19 LAB — C-REACTIVE PROTEIN: CRP: 0.2 mg/dL — ABNORMAL LOW (ref 0.5–20.0)

## 2017-09-19 LAB — VITAMIN D 25 HYDROXY (VIT D DEFICIENCY, FRACTURES): VITD: 20.18 ng/mL — ABNORMAL LOW (ref 30.00–100.00)

## 2017-09-19 NOTE — Telephone Encounter (Signed)
Sent pt a mychart message to ask who her bariatric provider is.

## 2017-09-21 ENCOUNTER — Telehealth: Payer: Self-pay | Admitting: Family Medicine

## 2017-09-21 LAB — RHEUMATOID FACTOR: Rhuematoid fact SerPl-aCnc: <14 IU/mL (ref <14)

## 2017-09-21 LAB — ANTI-NUCLEAR AB-TITER (ANA TITER)

## 2017-09-21 LAB — CYCLIC CITRUL PEPTIDE ANTIBODY, IGG: Cyclic Citrullin Peptide Ab: <16 UNITS

## 2017-09-21 LAB — IRON,TIBC AND FERRITIN PANEL
%SAT: 32 % (calc) (ref 16–45)
FERRITIN: 69 ng/mL (ref 16–232)
Iron: 103 ug/dL (ref 40–190)
TIBC: 323 ug/dL (ref 250–450)

## 2017-09-21 LAB — ANA: Anti Nuclear Antibody(ANA): POSITIVE — AB

## 2017-09-21 LAB — B. BURGDORFI ANTIBODIES: B burgdorferi Ab IgG+IgM: <0.9 index

## 2017-09-21 NOTE — Telephone Encounter (Signed)
Copied from [place] [id number]. Topic: Quick Communication - See Telephone Encounter >> Sep 21, 2017  5:24 PM Rutherford Nail, [place] wrote: CRM for notification. See Telephone encounter for: 09/21/17. Patient calling and is concerned about her ANA results. States that she would like a call to discuss these asap. Please advise. Also, states that the potassium that Dr Caryl Bis sent was 6 days worth and she was also taking a vit d3 gel. Would need a new prescription for both medications.  [id number] - [place] [address], [place] CB#: [phone]

## 2017-09-22 ENCOUNTER — Encounter: Payer: Self-pay | Admitting: Internal Medicine

## 2017-09-24 ENCOUNTER — Other Ambulatory Visit: Payer: Self-pay | Admitting: Internal Medicine

## 2017-09-24 DIAGNOSIS — M2550 Pain in unspecified joint: Secondary | ICD-10-CM

## 2017-09-24 DIAGNOSIS — R768 Other specified abnormal immunological findings in serum: Secondary | ICD-10-CM

## 2017-09-24 DIAGNOSIS — E559 Vitamin D deficiency, unspecified: Secondary | ICD-10-CM

## 2017-09-24 DIAGNOSIS — E876 Hypokalemia: Secondary | ICD-10-CM

## 2017-09-24 MED ORDER — POTASSIUM CHLORIDE ER 10 MEQ PO TBCR
10.0000 meq | EXTENDED_RELEASE_TABLET | Freq: Every day | ORAL | 0 refills | Status: DC
Start: 2017-09-24 — End: 2018-07-22

## 2017-09-24 MED ORDER — CHOLECALCIFEROL 1.25 MG (50000 UT) PO CAPS: 50000.0000 [IU] | ORAL_CAPSULE | ORAL | 1 refills | Status: DC

## 2017-09-24 NOTE — Telephone Encounter (Signed)
Contact pt to advise on results

## 2017-09-24 NOTE — Telephone Encounter (Signed)
Would like to discuss ANA results.

## 2017-09-24 NOTE — Telephone Encounter (Signed)
Labs ordered by Dr Terese Door

## 2017-09-24 NOTE — Telephone Encounter (Signed)
Pt called to check to see where the referral for rheumatology; contact to advise

## 2017-09-24 NOTE — Telephone Encounter (Signed)
Spoken to patient and informed her of what the ANA test was for.

## 2017-11-29 ENCOUNTER — Other Ambulatory Visit: Payer: Self-pay

## 2017-11-29 DIAGNOSIS — E876 Hypokalemia: Secondary | ICD-10-CM

## 2017-11-29 NOTE — Telephone Encounter (Signed)
She will need to have her potassium rechecked to determine if she still needs the supplement.  I have placed an order.  Please get her scheduled.  Thanks.

## 2017-11-29 NOTE — Telephone Encounter (Signed)
Fax from [facility] [place] [phone]   Refill request for Potassium cl ER 10 MEQ   Last OV 07/18/2016 with PCP last seen by Olivia Mackie on 09/18/2017   Last refilled 09/24/2017 disp 30 with no refills   Sent to PCP for approval

## 2018-01-03 ENCOUNTER — Telehealth: Payer: Self-pay

## 2018-01-03 NOTE — Telephone Encounter (Signed)
Copied from [place] [id number]. Topic: Appointment Scheduling - Scheduling Inquiry for Clinic >> Jan 03, 2018  3:08 PM Wynetta Emery, [place] C wrote: Reason for CRM: pt called in to schedule a ov with her PCP for having arm pain. Pt says that she came in to see Dr. Olivia Mackie and was given a shot, which helped a lot. Pt says that she was referred to rheumatology she has seen them and was told that theyre not seeing anything wrong with her arm. Pt called in to see them today and they suggested that she see her PCP because they're first opening isn't until 02/08/18. Pt would like to know if her PCP could work her in to see her sooner then his next available?   Please assist.

## 2018-01-03 NOTE — Telephone Encounter (Signed)
Sent to PCP please advise.   Thanks  

## 2018-01-03 NOTE — Telephone Encounter (Signed)
She can be scheduled on 315 slot to discuss arm pain.  She could also be scheduled at 430 on a Monday or Wednesday.  Thanks.

## 2018-01-04 NOTE — Telephone Encounter (Signed)
Patient scheduled  01/14/18 @ 1630

## 2018-01-04 NOTE — Telephone Encounter (Signed)
3:15 time slot is okay for discussion of her arm pain or a 4:30 appointment.

## 2018-01-04 NOTE — Telephone Encounter (Signed)
Sent to [facility] to schedule patient for SAME day appt at 3:15 PM. Thanks  Just FYI for PCP you sure you want to see patient for a 3:15 appt?

## 2018-01-07 ENCOUNTER — Encounter: Payer: Self-pay | Admitting: Family Medicine

## 2018-01-07 NOTE — Telephone Encounter (Signed)
Can you call her back and find out how long the weakness has been going on? Was she having the weakness when she saw rheumatology? Please see when the vision issue occurred and when she saw the eye doctor?   See below for message from [place].  ----- Message from Myriam Forehand, [place] sent at 01/07/2018 2:11 PM EST -----  Called and spoke with patient. Pt is c/o left arm pain mainly. Start out just in her elbow but now she can feel it all in her arm, elbow, and shoulder ( LEFT SIDE ONLY) Pt stated that she does have movement however, she can't lift anything nor, does she have the strength to open up a bag of chips on her own. Pt stated that in the middle of the night the pain will wake her up from her sleep because it hurts so badly that she will start to cry. Pt stated that she has all over body/joint pain and that her hands will curve inward at night and seem to lock up and her toes will do this too. She stated that this pain will be numb like or sharp and it comes and goes. Pt stated that she did go see a rheumatologist and they dd NOT feel that she had LUPUS but that she had carpal tunnel pt stated that she did NOT feel that this is what is causing her pain. Pt stated that the rheumatologist did suggest for her to follow up with Neurologist which she called before she reached out to [place] and they are unable to see her until February. Pt stated that she has also noticed issue with her vision and she couldn't see pt stated that everything went black for a few seconds and then everything went blurry. She has seen an eye doctor.     ----- Message from Karin Lieu to Leone Haven, MD sent at 01/07/2018 4:37 AM -----   I need to be seen by somebody right away. I can't wait until the sixth. My left arm is killing me and it's worse when I fall asleep and wake up. I can't really take this pain anymore. I have absolutely no strength in my left arm anymore Can I please get an appointment with  somebody? this pain is really getting to be too much and I don't know what to do.

## 2018-01-07 NOTE — Telephone Encounter (Signed)
Pt stated that her issues started back in September at that time she just had hand and feet swelling and itching. Pt stated she was bitten by a bug however, her feet were already swollen prior to the bit. Pt stated at that time she was given a steroid shot and that really seemed to help. Pt stated that after the shot she felt some what better still had some weakness but then about 4 weeks ago from today the left elbow pain started.   Pt stated that she went to see the eye doctor a few days after her appt with McLean-Scocuzza.Pt stated that is why she went to see the eye doctor was because of her referral from [facility] for her to see them because of her symptoms.  Pt stated that the eye doctor just noticed that her up close vision ( farsightiness) had really declined a lot since her last visit and they just gave her a new prescription for glasses. They did not explain the reason as to why the declined might have happen. Pt stated that she knew her vitamin B-1 was low and she read how low vitamin B-1 can cause vision issues but the eye doctor did not feel that was the reason.   Pt stated that YES she had these issues before she seen the

## 2018-01-07 NOTE — Telephone Encounter (Signed)
Please call the patient and get more detail. Can she move her arm? Is there swelling? Is there numbness? Does she have neck pain? What kind of pain is she having? Where is the pain located?

## 2018-01-07 NOTE — Telephone Encounter (Signed)
Message reviewed.  Please offer her an appointment with me or another provider in the office if available to be seen prior to January 6.  I did send the patient a my chart message advising her to be evaluated today if she has had any acute changes.  Please follow-up with her.

## 2018-01-07 NOTE — Telephone Encounter (Signed)
See pt MyChart message from today (01/07/2018).  Pt is wanting to know if she can be seen sooner.  Pt states pain is really bad and she has lost strength in that arm.

## 2018-01-08 NOTE — Telephone Encounter (Signed)
Called and spoke with patient. Pt has been advised and scheduled for an appt on 01/11/2018 with Lauren and wanted to keep her appt with you as well on 01/14/2018. Sent to PCP as an [place].

## 2018-01-08 NOTE — Telephone Encounter (Signed)
Please see my prior message and the phone note. Please call the patient.

## 2018-01-08 NOTE — Telephone Encounter (Signed)
Please contact the patient. Please offer her an appointment with Lauren on Thursday if any are available. She could also go to urgent care for evaluation or she could [facility] since it seems to be a joint/muscle issue. They are typically open 1-7 pm though that may be different with the holiday. Thanks .

## 2018-01-08 NOTE — Telephone Encounter (Signed)
Provider is aware of this situation and we have call patient and are communicating through [place]. Provider is trying to decide what will be the best for the patient to be seen here or a referral.

## 2018-01-08 NOTE — Telephone Encounter (Signed)
Called and spoke with patient. Pt does want to be seen sooner than 01/14/2017. Sent to PCP to advise.

## 2018-01-11 ENCOUNTER — Ambulatory Visit: Payer: Commercial Managed Care - PPO | Admitting: Family Medicine

## 2018-01-14 ENCOUNTER — Ambulatory Visit: Payer: Commercial Managed Care - PPO | Admitting: Family Medicine

## 2018-01-14 ENCOUNTER — Encounter: Payer: Self-pay | Admitting: Family Medicine

## 2018-01-14 VITALS — BP 122/64 | HR 65 | Temp 97.9°F | Ht 65.5 in | Wt 211.0 lb

## 2018-01-14 DIAGNOSIS — H547 Unspecified visual loss: Secondary | ICD-10-CM

## 2018-01-14 DIAGNOSIS — E519 Thiamine deficiency, unspecified: Secondary | ICD-10-CM

## 2018-01-14 DIAGNOSIS — M2550 Pain in unspecified joint: Secondary | ICD-10-CM

## 2018-01-14 DIAGNOSIS — R202 Paresthesia of skin: Secondary | ICD-10-CM

## 2018-01-14 DIAGNOSIS — E538 Deficiency of other specified B group vitamins: Secondary | ICD-10-CM | POA: Diagnosis not present

## 2018-01-14 DIAGNOSIS — R200 Anesthesia of skin: Secondary | ICD-10-CM | POA: Diagnosis not present

## 2018-01-14 DIAGNOSIS — D508 Other iron deficiency anemias: Secondary | ICD-10-CM

## 2018-01-14 DIAGNOSIS — Z9884 Bariatric surgery status: Secondary | ICD-10-CM

## 2018-01-14 DIAGNOSIS — E559 Vitamin D deficiency, unspecified: Secondary | ICD-10-CM

## 2018-01-14 DIAGNOSIS — L509 Urticaria, unspecified: Secondary | ICD-10-CM

## 2018-01-14 MED ORDER — METHYLPREDNISOLONE ACETATE 40 MG/ML IJ SUSP
40.0000 mg | Freq: Once | INTRAMUSCULAR | Status: AC
  Administered 2018-01-14: 40 mg via INTRAMUSCULAR

## 2018-01-14 NOTE — Patient Instructions (Signed)
Nice to see you. We will get lab work and have you see neurology. We have given you a Depo-Medrol shot today.  If your symptoms do not improve with this please let us know. If you develop any sensation of throat swelling please be evaluated immediately and use your EpiPen.

## 2018-01-14 NOTE — Assessment & Plan Note (Signed)
We will follow-up on her B1/B12/vitamin D deficiencies.

## 2018-01-14 NOTE — Assessment & Plan Note (Signed)
Patient with recent hives.  Does respond to Xyzal.  I encouraged continued use of this.  We gave her a Depo-Medrol shot in the office as that has been somewhat beneficial previously.

## 2018-01-14 NOTE — Assessment & Plan Note (Signed)
Iron levels recently in the normal range.  She has not had recurrent issues with this.  Plan to recheck periodically.

## 2018-01-14 NOTE — Assessment & Plan Note (Signed)
Recheck vitamin B1.

## 2018-01-14 NOTE — Assessment & Plan Note (Signed)
Patient has been evaluated by ophthalmology.  I think given her prior vision issues and her other varied symptoms she needs to see neurology.  Referral has been placed.

## 2018-01-14 NOTE — Progress Notes (Signed)
Tommi Rumps, MD Phone: [phone]  Whitney Hill is a 48 y.o. female who presents today for follow-up.  CC: Numbness and tingling, left elbow pain, scattered joint pain, hives  Patients symptoms go back to September when she was evaluated in our office for arthralgia as well as hand and foot swelling.  She also complained of weakness as well as vision changes at that time.  She notes her peripheral vision was affected for several minutes and she saw the ophthalmologist and was advised there were no significant abnormalities and that her near vision had worsened.  She had lab work that revealed a positive ANA and she was evaluated by rheumatology and notes they advised her she did not have lupus and was told she had carpal tunnel.  She did not feel that this diagnosis was correct and she did not fill the prescription for wrist splints.  Since that time she has continued to have joint aches specifically left elbow pain that has been quite significant at times and it does radiate up her arm.  She notes that has improved over the last several days.  She notes scattered joint aches elsewhere.  She has had no recurrence of her vision symptoms.  She does note tingling in her bilateral toes.  She feels as though her hands particularly the ulnar aspect will cramp particularly at night.  She has cramping in her feet.  She feels as though her hands are weak left greater than right and she has been dropping stuff.  She notes no neck pain or back pain.  No tick bites.  She does have hives which she has had previously.  These occur intermittently and do respond to Xyzal and Benadryl.  Previously she was given Xolair.  She notes the hives come out with being in the sun and with cold weather.  She has had no throat involvement.  She reports somewhat similar symptoms when she lived in [place] and she underwent an extensive evaluation there as well.  She additionally wonders about vitamin testing and iron  testing.  She is no longer following with hematology given that her anemia resolved with iron infusions.  She underwent EGD around the time of her anemia diagnosis with her bariatric surgeon.  This was negative per patient report for ulcer.  She underwent colonoscopy in 2011 with no abnormalities per her report.  Social History   Tobacco Use  Smoking Status Never Smoker  Smokeless Tobacco Never Used     ROS see history of present illness  Objective  Physical Exam Vitals:   01/14/18 1638  BP: 122/64  Pulse: 65  Temp: 97.9 F (36.6 C)  SpO2: 98%    BP Readings from Last 3 Encounters:  01/14/18 122/64  09/18/17 110/72  07/31/17 118/78   Wt Readings from Last 3 Encounters:  01/14/18 211 lb (95.7 kg)  09/18/17 195 lb 6.4 oz (88.6 kg)  07/31/17 188 lb 6 oz (85.4 kg)    Physical Exam Constitutional:      General: She is not in acute distress.    Appearance: She is not diaphoretic.  Cardiovascular:     Rate and Rhythm: Normal rate and regular rhythm.     Heart sounds: Normal heart sounds.  Pulmonary:     Effort: Pulmonary effort is normal.     Breath sounds: Normal breath sounds.  Musculoskeletal:     Comments: No muscular tenderness in her arms or legs, left elbow mildly tender over the proximal inner portion of the  elbow, no bony tenderness, no palpable abnormalities, negative Tinel's at bilateral ulnar grooves, negative Tinel's bilateral wrists  Skin:    General: Skin is warm and dry.  Neurological:     Mental Status: She is alert.     Comments: CN 2-12 intact, 5/5 strength in bilateral biceps, triceps, grip, quads, hamstrings, plantar and dorsiflexion, sensation to light touch intact in bilateral UE and LE, normal gait      Assessment/Plan: Please see individual problem list.  Hives Patient with recent hives.  Does respond to Xyzal.  I encouraged continued use of this.  We gave her a Depo-Medrol shot in the office as that has been somewhat beneficial  previously.  Arthralgia Patient continues to have issues with this.  No obvious cause has been noted at this time.  She has seen rheumatology.  I think given her numbness and tingling as well as scattered joint aches she should see neurology for evaluation to see if there is a neurological cause given lack of rheumatologic cause at this time.  I also discussed having her see her allergist given her hives as they may be able to shed some light on some of her symptoms as well.  We will refer to neurology.  We will obtain lab work to determine if there are any other causes noted.  S/P gastric bypass We will follow-up on her B1/B12/vitamin D deficiencies.  Iron deficiency anemia Iron levels recently in the normal range.  She has not had recurrent issues with this.  Plan to recheck periodically.  Vitamin D deficiency Recheck vitamin D.  Vitamin B1 deficiency Recheck vitamin B1.  Vision loss Patient has been evaluated by ophthalmology.  I think given her prior vision issues and her other varied symptoms she needs to see neurology.  Referral has been placed.    Orders Placed This Encounter  Procedures  . CK (Creatine Kinase)  . CBC  . Comp Met (CMET)  . TSH  . B12  . Vitamin B1  . Vitamin D (25 hydroxy)  . HgB A1c  . Ambulatory referral to Neurology    Referral Priority:   Routine    Referral Type:   Consultation    Referral Reason:   Specialty Services Required    Requested Specialty:   Neurology    Number of Visits Requested:   1    Meds ordered this encounter  Medications  . methylPREDNISolone acetate (DEPO-MEDROL) injection 40 mg     Tommi Rumps, MD [place]

## 2018-01-14 NOTE — Assessment & Plan Note (Signed)
Recheck vitamin D

## 2018-01-14 NOTE — Assessment & Plan Note (Signed)
Patient continues to have issues with this.  No obvious cause has been noted at this time.  She has seen rheumatology.  I think given her numbness and tingling as well as scattered joint aches she should see neurology for evaluation to see if there is a neurological cause given lack of rheumatologic cause at this time.  I also discussed having her see her allergist given her hives as they may be able to shed some light on some of her symptoms as well.  We will refer to neurology.  We will obtain lab work to determine if there are any other causes noted.

## 2018-01-15 LAB — COMPREHENSIVE METABOLIC PANEL
ALK PHOS: 74 U/L (ref 39–117)
ALT: 12 U/L (ref 0–35)
AST: 17 U/L (ref 0–37)
Albumin: 4.3 g/dL (ref 3.5–5.2)
BILIRUBIN TOTAL: 0.4 mg/dL (ref 0.2–1.2)
BUN: 20 mg/dL (ref 6–23)
CO2: 23 meq/L (ref 19–32)
Calcium: 9.5 mg/dL (ref 8.4–10.5)
Chloride: 108 mEq/L (ref 96–112)
Creatinine, Ser: 0.74 mg/dL (ref 0.40–1.20)
GFR: 88.36 mL/min (ref >60.00)
GLUCOSE: 91 mg/dL (ref 70–99)
POTASSIUM: 3.8 meq/L (ref 3.5–5.1)
SODIUM: 138 meq/L (ref 135–145)
TOTAL PROTEIN: 7.1 g/dL (ref 6.0–8.3)

## 2018-01-15 LAB — CBC
HCT: 41.3 % (ref 36.0–46.0)
HEMOGLOBIN: 13.6 g/dL (ref 12.0–15.0)
MCHC: 33 g/dL (ref 30.0–36.0)
MCV: 97.3 fl (ref 78.0–100.0)
Platelets: 373 10*3/uL (ref 150.0–400.0)
RBC: 4.25 Mil/uL (ref 3.87–5.11)
RDW: 12.9 % (ref 11.5–15.5)
WBC: 9.6 10*3/uL (ref 4.0–10.5)

## 2018-01-15 LAB — TSH: TSH: 4.49 u[IU]/mL (ref 0.35–4.50)

## 2018-01-15 LAB — VITAMIN D 25 HYDROXY (VIT D DEFICIENCY, FRACTURES): VITD: 21.57 ng/mL — AB (ref 30.00–100.00)

## 2018-01-15 LAB — CK: Total CK: 54 U/L (ref 7–177)

## 2018-01-15 LAB — VITAMIN B12: Vitamin B-12: 474 pg/mL (ref 211–911)

## 2018-01-15 LAB — HEMOGLOBIN A1C: HEMOGLOBIN A1C: 5.4 % (ref 4.6–6.5)

## 2018-01-16 LAB — VITAMIN B1

## 2018-01-17 ENCOUNTER — Inpatient Hospital Stay
Admission: EM | Admit: 2018-01-17 | Discharge: 2018-01-23 | DRG: 641 | Disposition: A | Discharge status: Home / Self Care | Payer: Commercial Managed Care - PPO | Attending: Internal Medicine | Admitting: Internal Medicine

## 2018-01-17 ENCOUNTER — Other Ambulatory Visit: Payer: Self-pay | Admitting: Family Medicine

## 2018-01-17 ENCOUNTER — Emergency Department: Payer: Commercial Managed Care - PPO

## 2018-01-17 ENCOUNTER — Other Ambulatory Visit: Payer: Self-pay

## 2018-01-17 ENCOUNTER — Encounter: Payer: Self-pay | Admitting: *Deleted

## 2018-01-17 DIAGNOSIS — Z79899 Other long term (current) drug therapy: Secondary | ICD-10-CM

## 2018-01-17 DIAGNOSIS — Z880 Allergy status to penicillin: Secondary | ICD-10-CM | POA: Diagnosis not present

## 2018-01-17 DIAGNOSIS — Z8051 Family history of malignant neoplasm of kidney: Secondary | ICD-10-CM | POA: Diagnosis not present

## 2018-01-17 DIAGNOSIS — R29818 Other symptoms and signs involving the nervous system: Secondary | ICD-10-CM | POA: Diagnosis not present

## 2018-01-17 DIAGNOSIS — M25562 Pain in left knee: Secondary | ICD-10-CM | POA: Diagnosis present

## 2018-01-17 DIAGNOSIS — Z86718 Personal history of other venous thrombosis and embolism: Secondary | ICD-10-CM

## 2018-01-17 DIAGNOSIS — M25522 Pain in left elbow: Secondary | ICD-10-CM | POA: Diagnosis present

## 2018-01-17 DIAGNOSIS — R29898 Other symptoms and signs involving the musculoskeletal system: Secondary | ICD-10-CM | POA: Diagnosis not present

## 2018-01-17 DIAGNOSIS — M50223 Other cervical disc displacement at C6-C7 level: Secondary | ICD-10-CM | POA: Diagnosis not present

## 2018-01-17 DIAGNOSIS — Z9049 Acquired absence of other specified parts of digestive tract: Secondary | ICD-10-CM

## 2018-01-17 DIAGNOSIS — Z23 Encounter for immunization: Secondary | ICD-10-CM | POA: Diagnosis not present

## 2018-01-17 DIAGNOSIS — Z8711 Personal history of peptic ulcer disease: Secondary | ICD-10-CM | POA: Diagnosis not present

## 2018-01-17 DIAGNOSIS — E519 Thiamine deficiency, unspecified: Secondary | ICD-10-CM | POA: Diagnosis not present

## 2018-01-17 DIAGNOSIS — R531 Weakness: Secondary | ICD-10-CM | POA: Diagnosis present

## 2018-01-17 DIAGNOSIS — Z96652 Presence of left artificial knee joint: Secondary | ICD-10-CM | POA: Diagnosis present

## 2018-01-17 DIAGNOSIS — Z833 Family history of diabetes mellitus: Secondary | ICD-10-CM

## 2018-01-17 DIAGNOSIS — R768 Other specified abnormal immunological findings in serum: Secondary | ICD-10-CM | POA: Diagnosis present

## 2018-01-17 DIAGNOSIS — G8929 Other chronic pain: Secondary | ICD-10-CM | POA: Diagnosis present

## 2018-01-17 DIAGNOSIS — H539 Unspecified visual disturbance: Secondary | ICD-10-CM | POA: Diagnosis present

## 2018-01-17 DIAGNOSIS — Z803 Family history of malignant neoplasm of breast: Secondary | ICD-10-CM

## 2018-01-17 DIAGNOSIS — Z823 Family history of stroke: Secondary | ICD-10-CM

## 2018-01-17 DIAGNOSIS — D6851 Activated protein C resistance: Secondary | ICD-10-CM | POA: Diagnosis present

## 2018-01-17 DIAGNOSIS — Z8249 Family history of ischemic heart disease and other diseases of the circulatory system: Secondary | ICD-10-CM

## 2018-01-17 DIAGNOSIS — Z9884 Bariatric surgery status: Secondary | ICD-10-CM

## 2018-01-17 DIAGNOSIS — D509 Iron deficiency anemia, unspecified: Secondary | ICD-10-CM | POA: Diagnosis present

## 2018-01-17 DIAGNOSIS — R200 Anesthesia of skin: Secondary | ICD-10-CM | POA: Diagnosis not present

## 2018-01-17 DIAGNOSIS — M79602 Pain in left arm: Secondary | ICD-10-CM | POA: Diagnosis present

## 2018-01-17 DIAGNOSIS — E039 Hypothyroidism, unspecified: Secondary | ICD-10-CM | POA: Diagnosis not present

## 2018-01-17 DIAGNOSIS — M6281 Muscle weakness (generalized): Secondary | ICD-10-CM | POA: Diagnosis not present

## 2018-01-17 DIAGNOSIS — Z888 Allergy status to other drugs, medicaments and biological substances status: Secondary | ICD-10-CM

## 2018-01-17 MED ORDER — ACETAMINOPHEN 325 MG PO TABS: 650.0000 mg | ORAL_TABLET | Freq: Four times a day (QID) | ORAL | Status: DC | PRN

## 2018-01-17 MED ORDER — THIAMINE HCL 100 MG/ML IJ SOLN
20.0000 mg | Freq: Once | INTRAMUSCULAR | Status: AC
  Administered 2018-01-17: 20 mg via INTRAVENOUS
  Filled 2018-01-17: qty 2

## 2018-01-17 MED ORDER — ACETAMINOPHEN 650 MG RE SUPP: 650.0000 mg | Freq: Four times a day (QID) | RECTAL | Status: DC | PRN

## 2018-01-17 MED ORDER — POLYETHYLENE GLYCOL 3350 17 G PO PACK: 17.0000 g | PACK | Freq: Every day | ORAL | Status: DC | PRN

## 2018-01-17 MED ORDER — LORATADINE 10 MG PO TABS
10.0000 mg | ORAL_TABLET | Freq: Every day | ORAL | Status: DC
  Administered 2018-01-19: 10:00:00 10 mg via ORAL
  Filled 2018-01-17 (×5): qty 1

## 2018-01-17 MED ORDER — THIAMINE HCL 100 MG/ML IJ SOLN
20.0000 mg | Freq: Three times a day (TID) | INTRAMUSCULAR | Status: DC
  Administered 2018-01-17: 20 mg via INTRAVENOUS
  Filled 2018-01-17 (×4): qty 0.2

## 2018-01-17 MED ORDER — ONDANSETRON HCL 4 MG/2ML IJ SOLN: 4.0000 mg | Freq: Four times a day (QID) | INTRAMUSCULAR | Status: DC | PRN

## 2018-01-17 MED ORDER — THYROID 60 MG PO TABS
120.0000 mg | ORAL_TABLET | Freq: Every day | ORAL | Status: DC
  Administered 2018-01-17 – 2018-01-23 (×6): 120 mg via ORAL
  Filled 2018-01-17 (×6): qty 2

## 2018-01-17 MED ORDER — ONDANSETRON HCL 4 MG PO TABS: 4.0000 mg | ORAL_TABLET | Freq: Four times a day (QID) | ORAL | Status: DC | PRN

## 2018-01-17 MED ORDER — ENOXAPARIN SODIUM 40 MG/0.4ML ~~LOC~~ SOLN
40.0000 mg | SUBCUTANEOUS | Status: DC
  Administered 2018-01-17 – 2018-01-22 (×6): 40 mg via SUBCUTANEOUS
  Filled 2018-01-17 (×7): qty 0.4

## 2018-01-17 MED ORDER — TAB-A-VITE/IRON PO TABS
1.0000 | ORAL_TABLET | Freq: Every day | ORAL | Status: DC
  Administered 2018-01-17 – 2018-01-23 (×7): 1 via ORAL
  Filled 2018-01-17 (×7): qty 1

## 2018-01-17 MED ORDER — DIPHENHYDRAMINE HCL 50 MG/ML IJ SOLN
12.5000 mg | Freq: Three times a day (TID) | INTRAMUSCULAR | Status: DC | PRN
  Administered 2018-01-17 – 2018-01-18 (×3): 12.5 mg via INTRAVENOUS
  Filled 2018-01-17 (×4): qty 0.25

## 2018-01-17 MED ORDER — GADOBUTROL 1 MMOL/ML IV SOLN
9.5000 mL | Freq: Once | INTRAVENOUS | Status: AC | PRN
  Administered 2018-01-17: 9.5 mL via INTRAVENOUS
  Filled 2018-01-17: qty 10

## 2018-01-17 MED ORDER — DICLOFENAC SODIUM 1 % TD GEL
2.0000 g | Freq: Four times a day (QID) | TRANSDERMAL | Status: DC
  Administered 2018-01-17 – 2018-01-22 (×7): 2 g via TOPICAL
  Filled 2018-01-17: qty 100

## 2018-01-17 NOTE — ED Triage Notes (Addendum)
  Pt to ED from PCP office after being told to come to ED for a Thiamine infusion due to low levels in bloodwork. PT reports having known it was low in August but could not afford the infusion. Pt has since been tested for Lupus which rheumatology ruled out. Pt has had continued left arm weakness and numbness in extremities to which pt now has a neurology follow up but PCP wanted to have Thiamine infusion before neurology follow up.

## 2018-01-17 NOTE — H&P (Addendum)
[facility] NAME: Whitney Hill    MR#:  [id number]  DATE OF BIRTH:  September 09, 1970  DATE OF ADMISSION:  01/17/2018  PRIMARY CARE PHYSICIAN: Leone Haven, MD   REQUESTING/REFERRING PHYSICIAN: Eula Listen, MD  CHIEF COMPLAINT:   Chief Complaint  Patient presents with  . Thiamine infusion    HISTORY OF PRESENT ILLNESS:  Whitney Hill  is a 48 y.o. female with a known history of hypothyroidism, history DVT, iron deficiency anemia who was sent to the ED by her PCPs office for vitamin B-1 injections.  She has been having vague symptoms for the last couple of months.  Starting in August, she noted edema of her hands and feet.  She then developed left-sided weakness.  She was diagnosed with a thiamine deficiency and started supplementation.  In September, she then had a loss of her peripheral vision.  She developed whole body pain.  She had a positive ANA at her PCPs office at that time.  She was referred to rheumatology, who thought that her symptoms were secondary to carpal tunnel syndrome.  She was also evaluated by ophthalmology, who did not find any abnormal findings on exam.  She then developed tingling of her hands and feet as well as loss of grip strength of her left hand.  She also notes that she sometimes "falls to the left side".  She had her thiamine level rechecked on 01/15/2027 at her PCPs office, and her thiamine level was still low.  In the ED, vitals were unremarkable.  Neurology was consulted, and recommended IV thiamine supplementation.  Hospitalists were called for admission.  PAST MEDICAL HISTORY:   Past Medical History:  Diagnosis Date  . Arthritis    OA  . Complication of anesthesia    "ITCHING TO FACE, BENADRYL HELP BUT DROPS BLOOD PRESSURE"  . DVT (deep venous thrombosis) ([place])    following first knee surgery in 2011  . Gastric ulcer   . Headache   . Heterozygous factor V Leiden mutation ([place])     . Hypothyroid   . Iron deficiency anemia     PAST SURGICAL HISTORY:   Past Surgical History:  Procedure Laterality Date  . Short  . CHOLECYSTECTOMY     1998  . [place], 2001  . GASTRIC BYPASS     2012, revision 2013  . KNEE ARTHROSCOPY     2011, 2013, 2015  . TOOTH EXTRACTION     2010  . TOTAL KNEE ARTHROPLASTY Left 05/25/2015   Procedure: LEFT TOTAL KNEE ARTHROPLASTY;  Surgeon: Renette Butters, MD;  Location: [facility];  Service: Orthopedics;  Laterality: Left;  . TUBAL LIGATION     2004    SOCIAL HISTORY:   Social History   Tobacco Use  . Smoking status: Never Smoker  . Smokeless tobacco: Never Used  Substance Use Topics  . Alcohol use: No    Alcohol/week: 0.0 standard drinks    FAMILY HISTORY:   Family History  Problem Relation Age of Onset  . Arthritis Other        parent  . Hyperlipidemia Other        parent  . Stroke Other        parent  . Hypertension Other        parent  . Kidney disease Other  parent, grandparent  . Diabetes Other        parent, grandparent  . Renal cancer Other        parent  . Breast cancer Maternal Aunt 68       BRACA + dx twice age 93 and 63  . Breast cancer Maternal Grandmother        late 50's    DRUG ALLERGIES:   Allergies  Allergen Reactions  . Paxil [Paroxetine Hcl] Anaphylaxis    Throat swelled shut  . Penicillins Hives    REVIEW OF SYSTEMS:   Review of Systems  Constitutional: Negative for chills and fever.  HENT: Negative for congestion and sore throat.   Eyes: Positive for blurred vision. Negative for double vision.  Respiratory: Negative for cough and shortness of breath.   Cardiovascular: Positive for leg swelling. Negative for chest pain and palpitations.  Gastrointestinal: Negative for abdominal pain, nausea and vomiting.  Genitourinary: Negative for dysuria and urgency.  Musculoskeletal: Positive for falls and joint pain.  Negative for neck pain.  Neurological: Positive for dizziness, tingling, sensory change and focal weakness. Negative for headaches.  Psychiatric/Behavioral: Negative for depression. The patient is not nervous/anxious.     MEDICATIONS AT HOME:   Prior to Admission medications   Medication Sig Start Date End Date Taking? Authorizing Provider  Cholecalciferol 50000 units capsule Take 1 capsule (50,000 Units total) by mouth once a week. 09/24/17   McLean-Scocuzza, Nino Glow, MD  clindamycin (CLEOCIN) 150 MG capsule Take 150 mg by mouth as needed. Prior to dental procedures 03/06/17   [provider]  EPINEPHrine 0.3 mg/0.3 mL IJ SOAJ injection Inject into the muscle. 10/04/16   [provider]  multivitamin-iron-minerals-folic acid (CENTRUM) chewable tablet Chew 1 tablet by mouth daily.    [provider]  nystatin (NYSTATIN) powder Apply topically 2 (two) times daily. 07/18/16   Leone Haven, MD  potassium chloride (K-DUR) 10 MEQ tablet Take 1 tablet (10 mEq total) by mouth daily. 09/24/17   McLean-Scocuzza, Nino Glow, MD  thiamine 100 MG tablet Take 200 mg by mouth daily.    [provider]  Thyroid 97.5 MG TABS Take 1 tablet (97.5 mg total) by mouth daily. Patient taking differently: Take 120 mg by mouth daily.  08/19/15   Leone Haven, MD  triamcinolone cream (KENALOG) 0.1 % Apply 1 application topically 2 (two) times daily. 08/30/16   Leone Haven, MD  Vitamin D, Ergocalciferol, (DRISDOL) 50000 units CAPS capsule Take 1 capsule (50,000 Units total) by mouth every 7 (seven) days. 02/14/16   Leone Haven, MD      VITAL SIGNS:  Blood pressure 133/83, pulse 61, temperature 97.6 F (36.4 C), temperature source Oral, resp. rate 16, height 5\' 5"  (1.651 m), weight 95.7 kg, SpO2 100 %.  PHYSICAL EXAMINATION:  Physical Exam  GENERAL:  48 y.o.-year-old patient lying in the bed with no acute distress.  EYES: Pupils equal, round, reactive to light and  accommodation. No scleral icterus. Extraocular muscles intact.  HEENT: Head atraumatic, normocephalic. Oropharynx and nasopharynx clear.  NECK:  Supple, no jugular venous distention. No thyroid enlargement, no tenderness.  LUNGS: Normal breath sounds bilaterally, no wheezing, rales,rhonchi or crepitation. No use of accessory muscles of respiration.  CARDIOVASCULAR: RRR, S1, S2 normal. No murmurs, rubs, or gallops.  ABDOMEN: Soft, nontender, nondistended. Bowel sounds present. No organomegaly or mass.  EXTREMITIES: No pedal edema, cyanosis, or clubbing.  NEUROLOGIC: Cranial nerves II through XII are intact. Muscle strength  5/5 in all extremities. 4/5 grip strength on the left. Sensation intact, except for numbness throughtout the left lower leg.  Normal finger-to-nose testing bilaterally.  Gait not checked.  PSYCHIATRIC: The patient is alert and oriented x 3.  SKIN: No obvious rash, lesion, or ulcer.   LABORATORY PANEL:   CBC Recent Labs  Lab 01/14/18 1714  WBC 9.6  HGB 13.6  HCT 41.3  PLT 373.0   ------------------------------------------------------------------------------------------------------------------  Chemistries  Recent Labs  Lab 01/14/18 1714  NA 138  K 3.8  CL 108  CO2 23  GLUCOSE 91  BUN 20  CREATININE 0.74  CALCIUM 9.5  AST 17  ALT 12  ALKPHOS 74  BILITOT 0.4   ------------------------------------------------------------------------------------------------------------------  Cardiac Enzymes No results for input(s): TROPONINI in the last 168 hours. ------------------------------------------------------------------------------------------------------------------  RADIOLOGY:  No results found.    IMPRESSION AND PLAN:   Thiamine deficiency- has persisted despite oral supplementation as an outpatient. Patient is s/p gastric bypass surgery. PCP with concern that patient may have dry beriberi. -Thiamine 20mg  IV tid, then transition back to po  thiamine  Left hand weakness/numbness/pain- also having other vague neurological symptoms like tingling in both hands and feet and vision changes. -Will obtain MRI brain to rule out stroke and MS -Neurology consult  Hypothyroidism- stable, recent TSH normal -Continue home thyroid med  Chronic left knee pain- s/p total knee replacement -Continue voltaren gel  All the records are reviewed and case discussed with ED provider. Management plans discussed with the patient, family and they are in agreement.  CODE STATUS: Full  TOTAL TIME TAKING CARE OF THIS PATIENT: 45 minutes.    Berna Spare  M.D on 01/17/2018 at 3:57 PM  Between 7am to 6pm - Pager - [phone]  After 6pm go to [email or web address] - [employer] [facility]  Office  [phone]  CC: Primary care physician; Leone Haven, MD   Note: This dictation was prepared with Dragon dictation along with smaller phrase technology. Any transcriptional errors that result from this process are unintentional.

## 2018-01-17 NOTE — ED Notes (Signed)
Patient transported to MRI 

## 2018-01-17 NOTE — ED Provider Notes (Signed)
[facility] Emergency Department Provider Note  ____________________________________________  Time seen: Approximately 2:48 PM  I have reviewed the triage vital signs and the nursing notes.   HISTORY  Chief Complaint Thiamine infusion    HPI Whitney Hill is a 48 y.o. female status post gastric bypass surgery, factor V Leiden mutation, thiamine deficiency, presenting for thiamine infusion.  The patient has a long and complicated history which includes more than 6 months of left upper extremity weakness with pain in the left upper extremity, chronic intermittent hives, intermittent visual changes.  She has had some worsening left arm pain over the last 3 days.  In 8/19, she was told she had thiamine deficiency and was given oral thiamine supplementation, which she states she has been taking.  She is also been evaluated by rheumatology and ruled out for lupus.  He has also been evaluated by ophthalmology for visual changes, without any definitive diagnosis.  She is being referred by her PMD to neurology.  On Monday, she had her laboratory studies rechecked and today was called by her PMDs office to come to the emergency department for thiamine repletion.  On Monday she was also given an intragluteal dose of methylprednisolone for her hives, which have completely resolved.   Past Medical History:  Diagnosis Date  . Arthritis    OA  . Complication of anesthesia    "ITCHING TO FACE, BENADRYL HELP BUT DROPS BLOOD PRESSURE"  . DVT (deep venous thrombosis) ([place])    following first knee surgery in 2011  . Gastric ulcer   . Headache   . Heterozygous factor V Leiden mutation ([place])   . Hypothyroid   . Iron deficiency anemia     Patient Active Problem List   Diagnosis Date Noted  . Arthralgia 09/18/2017  . Tick bite 09/18/2017  . Vitamin D deficiency 09/18/2017  . Hashimoto's disease 09/18/2017  . Vision loss 09/18/2017  . Vitamin B1 deficiency 09/18/2017  .  Hives 03/13/2017  . Candidal intertrigo 07/18/2016  . Constipation 04/28/2016  . Right ankle pain 04/28/2016  . Abnormal uterine bleeding 02/07/2016  . Iron deficiency 02/04/2016  . Late period 11/24/2015  . Encounter for general adult medical examination with abnormal findings 10/14/2015  . Skin nodule 09/20/2015  . Status post left partial knee replacement, medial aspect 07/09/2015  . Obesity 07/09/2015  . Knee osteochondritis dessicans 05/25/2015  . Plantar fasciitis 05/13/2015  . Iron deficiency anemia 01/14/2015  . Hypothyroidism 10/01/2014  . Left knee pain 10/01/2014  . Cramps, extremity 10/01/2014  . Allergic rhinitis 10/01/2014  . S/P gastric bypass 10/01/2014    Past Surgical History:  Procedure Laterality Date  . [place]  . CHOLECYSTECTOMY     1998  . [place], 2001  . GASTRIC BYPASS     2012, revision 2013  . KNEE ARTHROSCOPY     2011, 2013, 2015  . TOOTH EXTRACTION     2010  . TOTAL KNEE ARTHROPLASTY Left 05/25/2015   Procedure: LEFT TOTAL KNEE ARTHROPLASTY;  Surgeon: Renette Butters, MD;  Location: [facility];  Service: Orthopedics;  Laterality: Left;  . TUBAL LIGATION     2004    Current Outpatient Rx  . Order #: [id number] Class: Normal  . Order #: [id number] Class: Historical Med  . Order #: [id number] Class: Historical Med  . Order #: [id number] Class: Historical Med  . Order #: [id number] Class: Normal  . Order #:  [id number] Class: Normal  . Order #: [id number] Class: Historical Med  . Order #: [id number] Class: Normal  . Order #: [id number] Class: Normal  . Order #: [id number] Class: Normal    Allergies Paxil [paroxetine hcl] and Penicillins  Family History  Problem Relation Age of Onset  . Arthritis Other        parent  . Hyperlipidemia Other        parent  . Stroke Other        parent  . Hypertension Other        parent  . Kidney disease Other        parent, grandparent  . Diabetes Other         parent, grandparent  . Renal cancer Other        parent  . Breast cancer Maternal Aunt 45       BRACA + dx twice age 71 and 90  . Breast cancer Maternal Grandmother        late 26's    Social History Social History   Tobacco Use  . Smoking status: Never Smoker  . Smokeless tobacco: Never Used  Substance Use Topics  . Alcohol use: No    Alcohol/week: 0.0 standard drinks  . Drug use: No    Review of Systems Constitutional: No fever/chills.  P.  Positive general malaise. Eyes: Intermittent blurred vision; has been cleared by ophthalmology. ENT: No sore throat. No congestion or rhinorrhea. Cardiovascular: Denies chest pain. Denies palpitations. Respiratory: Denies shortness of breath.  No cough. Gastrointestinal: No abdominal pain.  No nausea, no vomiting.  No diarrhea.  No constipation. Genitourinary: Negative for dysuria. Musculoskeletal: Negative for back pain.  No neck pain.  Skin: Negative for rash. Neurological: Positive for intermittent blurred vision.  Positive for intermittent left upper extremity tingling.  Positive for left upper extremity weakness.    ____________________________________________   PHYSICAL EXAM:  VITAL SIGNS: ED Triage Vitals  Enc Vitals Group     BP 01/17/18 1122 104/71     Pulse Rate 01/17/18 1122 65     Resp 01/17/18 1122 16     Temp 01/17/18 1122 97.6 F (36.4 C)     Temp Source 01/17/18 1122 Oral     SpO2 01/17/18 1122 99 %     Weight 01/17/18 1123 211 lb (95.7 kg)     Height 01/17/18 1123 5\' 5"  (1.651 m)     Head Circumference --      Peak Flow --      Pain Score 01/17/18 1123 2     Pain Loc --      Pain Edu? --      Excl. in [place]? --     Constitutional: Alert and oriented.  Answers questions appropriately. Eyes: Conjunctivae are normal.  EOMI. No scleral icterus. Head: Atraumatic. Nose: No congestion/rhinnorhea. Mouth/Throat: Mucous membranes are moist.  Neck: No stridor.  Supple.  No JVD.  No  meningismus. Cardiovascular: Normal rate, regular rhythm. No murmurs, rubs or gallops.  Respiratory: Normal respiratory effort.  No accessory muscle use or retractions. Lungs CTAB.  No wheezes, rales or ronchi. Gastrointestinal: Overweight. Soft, nontender and nondistended.  No guarding or rebound.  No peritoneal signs. Musculoskeletal: No LE edema. Neurologic:  A&Ox3.  Speech is clear.  Face and smile are symmetric.  EOMI.  Moves all extremities well. Neuro: Alert and oriented x3.  Face and smile are symmetric.  EOMI and PERRLA.  Speech is clear.  5 out of 5 bilateral grip strength, biceps and triceps  strength.  Normal gait without ataxia.  Sensation to light touch in the upper and lower extremities, face, bilaterally. Skin:  Skin is warm, dry and intact. No rash noted. Psychiatric: Mood and affect are normal.  ____________________________________________   LABS (all labs ordered are listed, but only abnormal results are displayed)  Labs Reviewed - No data to display ____________________________________________  EKG  Not indicated ____________________________________________  RADIOLOGY  No results found.  ____________________________________________   PROCEDURES  Procedure(s) performed: None  Procedures  Critical Care performed: No ____________________________________________   INITIAL IMPRESSION / ASSESSMENT AND PLAN / ED COURSE  Pertinent labs & imaging results that were available during my care of the patient were reviewed by me and considered in my medical decision making (see chart for details).  48 y.o. female with multiple medical problems presenting for thiamine infusion.  Overall, the patient is hemodynamically stable and I do not see any focal severe neurologic deficits on the patient's exam.  She has been having symptoms have been lasting for over 6 months and acute CVA is unlikely.  Multiple sclerosis is possible, but this patient is already being referred to  an outpatient neurologist for further evaluation.  Today, I am able to see that the patient's thiamine level was less than 6 on her laboratory studies from yesterday.  She is also deficient in vitamin D.  I have reviewed the patient's chart and the note from her primary care physician yesterday.  I am looking at thiamine infusion labeling instructions, and do not see any instructions for dosing.  I have spoken with Dr. Ellen Henri nurse, who will try to contact the physician.  If he feels that she has acute neurologic deficits from her thiamine deficiency, she will need to be admitted to the hospital for repeated thiamine infusions.  I do not feel comfortable dosing her one time, as this would be off label use, with the medication that I do not generally prescribe.  I do not see any evidence of beriberi or Warnicke's encephalopathy today.  ----------------------------------------- 3:10 PM on 01/17/2018 -----------------------------------------  I have spoken with Dr. Caryl Bis, the patient's primary care physician.  His initial plan was for admission for this patient for evaluation of possible dry beriberi.  I will proceed with admission, and have ordered an MRI to evaluate for multiple sclerosis.  20 mg of IV thiamine have been ordered.  ____________________________________________  FINAL CLINICAL IMPRESSION(S) / ED DIAGNOSES  Final diagnoses:  Thiamine deficiency  Left arm weakness  Left arm numbness         NEW MEDICATIONS STARTED DURING THIS VISIT:  New Prescriptions   No medications on file      Eula Listen, MD 01/17/18 1511

## 2018-01-17 NOTE — ED Notes (Signed)
First Nurse Note: patient alert and oriented in lobby, wait explained.  No new complaints verbalized.

## 2018-01-17 NOTE — ED Notes (Signed)
ED Provider at bedside. 

## 2018-01-18 ENCOUNTER — Inpatient Hospital Stay: Payer: Commercial Managed Care - PPO

## 2018-01-18 DIAGNOSIS — E519 Thiamine deficiency, unspecified: Principal | ICD-10-CM

## 2018-01-18 DIAGNOSIS — R29898 Other symptoms and signs involving the musculoskeletal system: Secondary | ICD-10-CM

## 2018-01-18 DIAGNOSIS — R200 Anesthesia of skin: Secondary | ICD-10-CM

## 2018-01-18 LAB — BASIC METABOLIC PANEL
Anion gap: 6 (ref 5–15)
BUN: 18 mg/dL (ref 6–20)
CO2: 21 mmol/L — ABNORMAL LOW (ref 22–32)
Calcium: 8.5 mg/dL — ABNORMAL LOW (ref 8.9–10.3)
Chloride: 113 mmol/L — ABNORMAL HIGH (ref 98–111)
Creatinine, Ser: 0.64 mg/dL (ref 0.44–1.00)
GFR calc Af Amer: >60 mL/min (ref >60)
GFR calc non Af Amer: >60 mL/min (ref >60)
Glucose, Bld: 98 mg/dL (ref 70–99)
Potassium: 3.3 mmol/L — ABNORMAL LOW (ref 3.5–5.1)
Sodium: 140 mmol/L (ref 135–145)

## 2018-01-18 LAB — CBC
HCT: 36.7 % (ref 36.0–46.0)
Hemoglobin: 12 g/dL (ref 12.0–15.0)
MCH: 32.1 pg (ref 26.0–34.0)
MCHC: 32.7 g/dL (ref 30.0–36.0)
MCV: 98.1 fL (ref 80.0–100.0)
Platelets: 314 10*3/uL (ref 150–400)
RBC: 3.74 MIL/uL — ABNORMAL LOW (ref 3.87–5.11)
RDW: 12.7 % (ref 11.5–15.5)
WBC: 7.9 10*3/uL (ref 4.0–10.5)
nRBC: 0 % (ref 0.0–0.2)

## 2018-01-18 LAB — MAGNESIUM: Magnesium: 2 mg/dL (ref 1.7–2.4)

## 2018-01-18 MED ORDER — POTASSIUM CHLORIDE CRYS ER 20 MEQ PO TBCR
40.0000 meq | EXTENDED_RELEASE_TABLET | Freq: Once | ORAL | Status: AC
  Administered 2018-01-18: 40 meq via ORAL
  Filled 2018-01-18: qty 2

## 2018-01-18 MED ORDER — THIAMINE HCL 100 MG/ML IJ SOLN
500.0000 mg | Freq: Three times a day (TID) | INTRAVENOUS | Status: DC
  Administered 2018-01-18 – 2018-01-19 (×6): 500 mg via INTRAVENOUS
  Filled 2018-01-18 (×7): qty 5

## 2018-01-18 MED ORDER — SODIUM CHLORIDE 0.9 % IV SOLN
INTRAVENOUS | Status: DC | PRN
  Administered 2018-01-18: 23:00:00 via INTRAVENOUS
  Administered 2018-01-19 – 2018-01-22 (×4): 500 mL via INTRAVENOUS
  Administered 2018-01-23: 09:00:00 250 mL via INTRAVENOUS

## 2018-01-18 MED ORDER — INFLUENZA VAC SPLIT QUAD 0.5 ML IM SUSY
0.5000 mL | PREFILLED_SYRINGE | INTRAMUSCULAR | Status: AC
  Administered 2018-01-19: 0.5 mL via INTRAMUSCULAR
  Filled 2018-01-18: qty 0.5

## 2018-01-18 NOTE — Plan of Care (Signed)

## 2018-01-18 NOTE — Consult Note (Addendum)
Reason for Consult: Left side weakness and pain, visual disturbances Referring Physician: Demetrios Loll, MD  CC: Left side weakness and pain, visual disturbances  HPI: Whitney Hill is an 48 y.o. female with past medical history of thyroid dysfunction, factor V Leiden mutation, heterozygous, anemia, BRCA gene mutation negative, stomach ulcers, DVT, gastric bypass, multiple vitamin deficiency, and positive ANA presenting to the ED with complaints of left arm pain weakness and numbness in her lower extremities.  Patient reports she has been having intermittent pain in her left arm since September which is progressively worsened.  She states pain starts in her elbow now she can feel it all in her arm elbow and shoulder.  She is still able to move but cannot lift anything due to decreased strength in her arm.  Patient stated that in the middle of the night she was woken up by intense pain associated with generalized body and joint pain and curling of her fingers as if her fingers and toes were locked up.  She describes pain as numbing like a sharp which comes and goes.  She was previously referred to rheumatologist for elevated ANA who ruled out lupus, however she was thought to have carpal tunnel syndrome.  Patient also reports vision disturbance which she described as blurriness and brief loss of vision.  She has been evaluated by an ophthalmologist in the past with no abnormality in her vision noted.  Initial work-up in the ED revealed magnesium of 2.0, normal CBC, potassium 3.3, calcium 8.5.  MRI of the brain with and without contrast was obtained which did not show any acute intracranial abnormality or evidence of demyelinating disease.  Due to her history of thiamine deficiency, thiamine levels was obtained and patient started on thiamine replacement and admitted for further management.  Past Medical History:  Diagnosis Date  . Arthritis    OA  . Complication of anesthesia    "ITCHING TO FACE, BENADRYL  HELP BUT DROPS BLOOD PRESSURE"  . DVT (deep venous thrombosis) ([place])    following first knee surgery in 2011  . Gastric ulcer   . Headache   . Heterozygous factor V Leiden mutation ([place])   . Hypothyroid   . Iron deficiency anemia     Past Surgical History:  Procedure Laterality Date  . [facility]  . CHOLECYSTECTOMY     1998  . [place], 2001  . GASTRIC BYPASS     2012, revision 2013  . KNEE ARTHROSCOPY     2011, 2013, 2015  . TOOTH EXTRACTION     2010  . TOTAL KNEE ARTHROPLASTY Left 05/25/2015   Procedure: LEFT TOTAL KNEE ARTHROPLASTY;  Surgeon: Renette Butters, MD;  Location: [facility];  Service: Orthopedics;  Laterality: Left;  . TUBAL LIGATION     2004    Family History  Problem Relation Age of Onset  . Arthritis Other        parent  . Hyperlipidemia Other        parent  . Stroke Other        parent  . Hypertension Other        parent  . Kidney disease Other        parent, grandparent  . Diabetes Other        parent, grandparent  . Renal cancer Other        parent  . Breast cancer Maternal Aunt 30  BRACA + dx twice age 51 and 58  . Breast cancer Maternal Grandmother        late 34's    Social History:  reports that she has never smoked. She has never used smokeless tobacco. She reports that she does not drink alcohol or use drugs.  Allergies  Allergen Reactions  . Paxil [Paroxetine Hcl] Anaphylaxis    Throat swelled shut  . Penicillins Hives    Medications:  I have reviewed the patient's current medications. Prior to Admission:  Medications Prior to Admission  Medication Sig Dispense Refill Last Dose  . Cholecalciferol 50000 units capsule Take 1 capsule (50,000 Units total) by mouth once a week. 13 capsule 1 Past Week at Unknown time  . diclofenac sodium (VOLTAREN) 1 % GEL Apply 2 g topically 4 (four) times daily.   Past Month at Unknown time  . multivitamin-iron-minerals-folic acid  (CENTRUM) chewable tablet Chew 1 tablet by mouth daily.   01/16/2018 at 0800  . thyroid (ARMOUR) 120 MG tablet Take 120 mg by mouth daily before breakfast.   01/16/2018 at 0800  . clindamycin (CLEOCIN) 150 MG capsule Take 150 mg by mouth as needed. Prior to dental procedures  2 prn at prn  . EPINEPHrine 0.3 mg/0.3 mL IJ SOAJ injection Inject into the muscle.   prn at prn  . nystatin (NYSTATIN) powder Apply topically 2 (two) times daily. 15 g 0 prn at prn  . potassium chloride (K-DUR) 10 MEQ tablet Take 1 tablet (10 mEq total) by mouth daily. (Patient not taking: Reported on 01/17/2018) 30 tablet 0 Not Taking at Unknown time  . thiamine 100 MG tablet Take 200 mg by mouth daily.   Not Taking at Unknown time  . Thyroid 97.5 MG TABS Take 1 tablet (97.5 mg total) by mouth daily. (Patient not taking: Reported on 01/17/2018) 90 tablet 1 Not Taking at Unknown time  . triamcinolone cream (KENALOG) 0.1 % Apply 1 application topically 2 (two) times daily. 30 g 0 prn at prn  . Vitamin D, Ergocalciferol, (DRISDOL) 50000 units CAPS capsule Take 1 capsule (50,000 Units total) by mouth every 7 (seven) days. (Patient not taking: Reported on 01/17/2018) 8 capsule 0 Not Taking at Unknown time   Scheduled: . diclofenac sodium  2 g Topical QID  . enoxaparin (LOVENOX) injection  40 mg Subcutaneous Q24H  . [START ON 01/19/2018] Influenza vac split quadrivalent PF  0.5 mL Intramuscular Tomorrow-1000  . loratadine  10 mg Oral Daily  . multivitamins with iron  1 tablet Oral Daily  . thyroid  120 mg Oral QAC breakfast    ROS: History obtained from the patient   General ROS: negative for - chills, fatigue, fever, night sweats, weight gain or weight loss Psychological ROS: negative for - behavioral disorder, hallucinations, memory difficulties, mood swings or suicidal ideation Ophthalmic ROS: negative for - double vision, eye pain. Positive for blurry vision or loss of vision ENT ROS: negative for - epistaxis, nasal discharge,  oral lesions, sore throat, tinnitus or vertigo Allergy and Immunology ROS: negative for - hives or itchy/watery eyes Hematological and Lymphatic ROS: negative for - bleeding problems, bruising or swollen lymph nodes Endocrine ROS: negative for - galactorrhea, hair pattern changes, polydipsia/polyuria or temperature intolerance Respiratory ROS: negative for - cough, hemoptysis, shortness of breath or wheezing Cardiovascular ROS: negative for - chest pain, dyspnea on exertion, edema or irregular heartbeat Gastrointestinal ROS: negative for - abdominal pain, diarrhea, hematemesis, nausea/vomiting or stool incontinence Genito-Urinary ROS: negative for -  dysuria, hematuria, incontinence or urinary frequency/urgency Musculoskeletal ROS: Positive for - joint swelling or muscular weakness Neurological ROS: as noted in HPI Dermatological ROS: negative for rash and skin lesion changes  Physical Examination: Blood pressure 116/82, pulse 90, temperature 98.4 F (36.9 C), temperature source Oral, resp. rate 20, height 5' 5"  (1.651 m), weight 95.7 kg, SpO2 98 %.  HEENT-  Normocephalic, no lesions, without obvious abnormality.  Normal external eye and conjunctiva.  Normal TM's bilaterally.  Normal auditory canals and external ears. Normal external nose, mucus membranes and septum.  Normal pharynx. Cardiovascular- S1, S2 normal, pulses palpable throughout   Lungs- chest clear, no wheezing, rales, normal symmetric air entry Abdomen- soft, non-tender; bowel sounds normal; no masses,  no organomegaly Extremities- no edema Lymph-no adenopathy palpable Musculoskeletal-no joint tenderness, deformity or swelling Skin-warm and dry, no hyperpigmentation, vitiligo, or suspicious lesions  Neurological Exam   Mental Status: Alert, oriented, thought content appropriate.  Speech fluent without evidence of aphasia.  Able to follow 3 step commands without difficulty. Attention span and concentration seemed appropriate   Cranial Nerves: II: Discs flat bilaterally; Visual fields grossly normal, pupils equal, round, reactive to light and accommodation III,IV, VI: ptosis present in left eye, extra-ocular motions intact bilaterally V,VII: smile symmetric, facial light touch sensation intact VIII: hearing normal bilaterally IX,X: gag reflex present XI: bilateral shoulder shrug XII: midline tongue extension Motor: Right :  Upper extremity   5/5 Without pronator drift      Left: Upper extremity   4+/5 without pronator drift Right:   Lower extremity   5/5                                          Left: Lower extremity   5/5 Tone and bulk:normal tone throughout; no atrophy noted Sensory: Pinprick and light touch intact bilaterally Deep Tendon Reflexes: 2+ and symmetric throughout Plantars: Right: downgoing                              Left: downgoing Cerebellar: Finger-to-nose testing intact bilaterally. Heel to shin testing normal bilaterally Gait: not tested due to safety concerns  Data Reviewed  Laboratory Studies:   Basic Metabolic Panel: Recent Labs  Lab 01/14/18 1714 01/18/18 0431  NA 138 140  K 3.8 3.3*  CL 108 113*  CO2 23 21*  GLUCOSE 91 98  BUN 20 18  CREATININE 0.74 0.64  CALCIUM 9.5 8.5*  MG  --  2.0    Liver Function Tests: Recent Labs  Lab 01/14/18 1714  AST 17  ALT 12  ALKPHOS 74  BILITOT 0.4  PROT 7.1  ALBUMIN 4.3   No results for input(s): LIPASE, AMYLASE in the last 168 hours. No results for input(s): AMMONIA in the last 168 hours.  CBC: Recent Labs  Lab 01/14/18 1714 01/18/18 0431  WBC 9.6 7.9  HGB 13.6 12.0  HCT 41.3 36.7  MCV 97.3 98.1  PLT 373.0 314    Cardiac Enzymes: Recent Labs  Lab 01/14/18 1714  CKTOTAL 54    BNP: Invalid input(s): POCBNP  CBG: No results for input(s): GLUCAP in the last 168 hours.  Microbiology: Results for orders placed or performed during the hospital encounter of 05/14/15  Surgical pcr screen     Status: None    Collection Time: 05/14/15 10:18 AM  Result Value  Ref Range Status   MRSA, PCR NEGATIVE NEGATIVE Final   Staphylococcus aureus NEGATIVE NEGATIVE Final    Comment:        The Xpert SA Assay (FDA approved for NASAL specimens in patients over 24 years of age), is one component of a comprehensive surveillance program.  Test performance has been validated by [facility] for patients greater than or equal to 51 year old. It is not intended to diagnose infection nor to guide or monitor treatment.     Coagulation Studies: No results for input(s): LABPROT, INR in the last 72 hours.  Urinalysis: No results for input(s): COLORURINE, LABSPEC, PHURINE, GLUCOSEU, HGBUR, BILIRUBINUR, KETONESUR, PROTEINUR, UROBILINOGEN, NITRITE, LEUKOCYTESUR in the last 168 hours.  Invalid input(s): APPERANCEUR  Lipid Panel:     Component Value Date/Time   CHOL 177 03/13/2017 1002   TRIG 102.0 03/13/2017 1002   HDL 55.60 03/13/2017 1002   CHOLHDL 3 03/13/2017 1002   VLDL 20.4 03/13/2017 1002   LDLCALC 101 (H) 03/13/2017 1002    HgbA1C:  Lab Results  Component Value Date   HGBA1C 5.4 01/14/2018    Urine Drug Screen:  No results found for: LABOPIA, COCAINSCRNUR, LABBENZ, AMPHETMU, THCU, LABBARB  Alcohol Level: No results for input(s): ETH in the last 168 hours.  Other results: EKG: there are no previous tracings available for comparison.  Imaging: Mr Jeri Cos And Wo Contrast  Result Date: 01/17/2018 CLINICAL DATA:  Multiple neurologic symptoms. Rule out multiple sclerosis. EXAM: MRI HEAD WITHOUT AND WITH CONTRAST TECHNIQUE: Multiplanar, multiecho pulse sequences of the brain and surrounding structures were obtained without and with intravenous contrast. CONTRAST:  9.5 mL Gadovist IV COMPARISON:  None. FINDINGS: Brain: Ventricle size normal. Cerebral volume normal. Negative for acute or chronic infarct. Normal white matter. Normal brainstem and basal ganglia. Negative for hemorrhage or mass. Normal  enhancement postcontrast infusion. Vascular: Normal arterial flow void Skull and upper cervical spine: Negative Sinuses/Orbits: Air-fluid level right maxillary sinus. Remaining sinuses clear. Normal orbit Other: None IMPRESSION: Normal MRI brain without with contrast. No evidence of demyelinating disease. Electronically Signed   By: Franchot Gallo M.D.   On: 01/17/2018 17:13   Patient seen and examined.  Clinical course and management discussed.  Necessary edits performed.  I agree with the above.  Assessment and plan of care developed and discussed below.    Assessment: 48 y.o female with past medical history of thyroid dysfunction, factor V Leiden mutation, heterozygous, anemia, BRCA gene mutation negative, stomach ulcers, DVT, gastric bypass, multiple vitamin deficiency, and positive ANA presenting to the ED with complaints of left arm pain, weakness and numbness in her lower extremities and vision disturbances since September, 2019. Admitted per request of her PCP for possible thiamine deficiency and replacement. MRI of the brain with and without contrast reviewed and show no acute intracranial abnormality or evidence of demyelinating disease.  Thiamine level on 01/14/2018 was <6.  B12 low normal at 474.  TSH normal.  Vitamin D low at 21.57.  Plan: 1. Thiamine replacement at 512m IV q 8 hours for 48 hours then 2531mIV daily for 5 days.  Patient may then return to po supplementation of thiamine.   2.  Patient has B12 replacement at home.  To continue at a frequency of monthly injections with levels to be continued to be followed on an outpatient basis 3.  Vitamin D supplementation.   4.  MRI of the cervical spine without contrast to further investigate LUE weakness.  If no  improvement with supplementation and no etiology on imaging patient to have NCV/EMG testing on an outpatient basis.     This patient was staffed with Dr. Magda Paganini, [place] [facility]ince who personally evaluated patient, reviewed documentation and  agreed with assessment and plan of care as above.  Rufina Falco, DNP, FNP-BC Board certified Nurse Practitioner Neurology Department  01/18/2018, 2:18 PM   Alexis Goodell, MD Neurology [phone]  01/18/2018  2:53 PM

## 2018-01-18 NOTE — Progress Notes (Signed)
[facility] at [place] NAME: Taneya Conkel    MR#:  [id number]  DATE OF BIRTH:  March 18, 1970  SUBJECTIVE:  CHIEF COMPLAINT:   Chief Complaint  Patient presents with  . Thiamine infusion   Patient feels better, no complaints. REVIEW OF SYSTEMS:  Review of Systems  Constitutional: Negative for chills, fever and malaise/fatigue.  HENT: Negative for sore throat.   Eyes: Negative for blurred vision and double vision.  Respiratory: Negative for cough, hemoptysis, shortness of breath, wheezing and stridor.   Cardiovascular: Negative for chest pain, palpitations, orthopnea and leg swelling.  Gastrointestinal: Negative for abdominal pain, blood in stool, diarrhea, melena, nausea and vomiting.  Genitourinary: Negative for dysuria, flank pain and hematuria.  Musculoskeletal: Negative for back pain and joint pain.  Skin: Negative for rash.  Neurological: Negative for dizziness, sensory change, focal weakness, seizures, loss of consciousness, weakness and headaches.  Endo/Heme/Allergies: Negative for polydipsia.  Psychiatric/Behavioral: Negative for depression. The patient is not nervous/anxious.     DRUG ALLERGIES:   Allergies  Allergen Reactions  . Paxil [Paroxetine Hcl] Anaphylaxis    Throat swelled shut  . Penicillins Hives   VITALS:  Blood pressure 98/66, pulse (!) 59, temperature 97.9 F (36.6 C), resp. rate 18, height 5\' 5"  (1.651 m), weight 95.7 kg, SpO2 96 %. PHYSICAL EXAMINATION:  Physical Exam Constitutional:      General: She is not in acute distress. HENT:     Head: Normocephalic.     Mouth/Throat:     Mouth: Mucous membranes are moist.  Eyes:     General: No scleral icterus.    Conjunctiva/sclera: Conjunctivae normal.     Pupils: Pupils are equal, round, and reactive to light.  Neck:     Musculoskeletal: Normal range of motion and neck supple.     Vascular: No JVD.     Trachea: No tracheal deviation.    Cardiovascular:     Rate and Rhythm: Normal rate and regular rhythm.     Heart sounds: Normal heart sounds. No murmur. No gallop.   Pulmonary:     Effort: Pulmonary effort is normal. No respiratory distress.     Breath sounds: Normal breath sounds. No wheezing or rales.  Abdominal:     General: Bowel sounds are normal. There is no distension.     Palpations: Abdomen is soft.     Tenderness: There is no abdominal tenderness. There is no rebound.  Musculoskeletal: Normal range of motion.        General: No tenderness.     Right lower leg: No edema.     Left lower leg: No edema.  Skin:    Findings: No erythema or rash.  Neurological:     General: No focal deficit present.     Mental Status: She is alert and oriented to person, place, and time.     Cranial Nerves: No cranial nerve deficit.  Psychiatric:        Mood and Affect: Mood normal.    LABORATORY PANEL:  Female CBC Recent Labs  Lab 01/18/18 0431  WBC 7.9  HGB 12.0  HCT 36.7  PLT 314   ------------------------------------------------------------------------------------------------------------------ Chemistries  Recent Labs  Lab 01/14/18 1714 01/18/18 0431  NA 138 140  K 3.8 3.3*  CL 108 113*  CO2 23 21*  GLUCOSE 91 98  BUN 20 18  CREATININE 0.74 0.64  CALCIUM 9.5 8.5*  MG  --  2.0  AST 17  --  ALT 12  --   ALKPHOS 74  --   BILITOT 0.4  --    RADIOLOGY:  Mr Jeri Cos And Wo Contrast  Result Date: 01/17/2018 CLINICAL DATA:  Multiple neurologic symptoms. Rule out multiple sclerosis. EXAM: MRI HEAD WITHOUT AND WITH CONTRAST TECHNIQUE: Multiplanar, multiecho pulse sequences of the brain and surrounding structures were obtained without and with intravenous contrast. CONTRAST:  9.5 mL Gadovist IV COMPARISON:  None. FINDINGS: Brain: Ventricle size normal. Cerebral volume normal. Negative for acute or chronic infarct. Normal white matter. Normal brainstem and basal ganglia. Negative for hemorrhage or mass. Normal  enhancement postcontrast infusion. Vascular: Normal arterial flow void Skull and upper cervical spine: Negative Sinuses/Orbits: Air-fluid level right maxillary sinus. Remaining sinuses clear. Normal orbit Other: None IMPRESSION: Normal MRI brain without with contrast. No evidence of demyelinating disease. Electronically Signed   By: Franchot Gallo M.D.   On: 01/17/2018 17:13   ASSESSMENT AND PLAN:   Thiamine deficiency- has persisted despite oral supplementation as an outpatient. Patient is s/p gastric bypass surgery. PCP with concern that patient may have dry beriberi. -Thiamine 20mg  IV tid, then transition back to po thiamine. Check level tomorrow.  Left hand weakness/numbness/pain- also having other vague neurological symptoms like tingling in both hands and feet and vision changes. MRI brain is unremarkable.  Hypothyroidism- stable, recent TSH normal -Continue home thyroid med  Chronic left knee pain- s/p total knee replacement -Continue voltaren gel  All the records are reviewed and case discussed with Care Management/Social Worker. Management plans discussed with the patient, family and they are in agreement.  CODE STATUS: Full Code  TOTAL TIME TAKING CARE OF THIS PATIENT: 26 minutes.   More than 50% of the time was spent in counseling/coordination of care: YES  POSSIBLE D/C IN 1-2 DAYS, DEPENDING ON CLINICAL CONDITION.   Demetrios Loll M.D on 01/18/2018 at 1:42 PM  Between 7am to 6pm - Pager - [phone]  After 6pm go to [email or web address] - [facility]

## 2018-01-19 LAB — BASIC METABOLIC PANEL
Anion gap: 6 (ref 5–15)
BUN: 17 mg/dL (ref 6–20)
CALCIUM: 8.7 mg/dL — AB (ref 8.9–10.3)
CO2: 21 mmol/L — AB (ref 22–32)
Chloride: 113 mmol/L — ABNORMAL HIGH (ref 98–111)
Creatinine, Ser: 0.77 mg/dL (ref 0.44–1.00)
GFR calc Af Amer: >60 mL/min (ref >60)
GFR calc non Af Amer: >60 mL/min (ref >60)
Glucose, Bld: 98 mg/dL (ref 70–99)
Potassium: 3.8 mmol/L (ref 3.5–5.1)
Sodium: 140 mmol/L (ref 135–145)

## 2018-01-19 LAB — HIV ANTIBODY (ROUTINE TESTING W REFLEX): HIV Screen 4th Generation wRfx: NONREACTIVE

## 2018-01-19 MED ORDER — HYDROXYZINE HCL 25 MG PO TABS
25.0000 mg | ORAL_TABLET | Freq: Three times a day (TID) | ORAL | Status: DC | PRN
  Administered 2018-01-19 – 2018-01-23 (×8): 25 mg via ORAL
  Filled 2018-01-19 (×10): qty 1

## 2018-01-19 NOTE — Progress Notes (Signed)
[facility] at [place] NAME: Whitney Hill    MR#:  [id number]  DATE OF BIRTH:  [birth year]  SUBJECTIVE:  CHIEF COMPLAINT:   Chief Complaint  Patient presents with  . Thiamine infusion   Patient feels itchy all over.  She said she had itch before but Benadryl did not work. REVIEW OF SYSTEMS:  Review of Systems  Constitutional: Negative for chills, fever and malaise/fatigue.  HENT: Negative for sore throat.   Eyes: Negative for blurred vision and double vision.  Respiratory: Negative for cough, hemoptysis, shortness of breath, wheezing and stridor.   Cardiovascular: Negative for chest pain, palpitations, orthopnea and leg swelling.  Gastrointestinal: Negative for abdominal pain, blood in stool, diarrhea, melena, nausea and vomiting.  Genitourinary: Negative for dysuria, flank pain and hematuria.  Musculoskeletal: Negative for back pain and joint pain.  Skin: Positive for itching. Negative for rash.  Neurological: Negative for dizziness, sensory change, focal weakness, seizures, loss of consciousness, weakness and headaches.  Endo/Heme/Allergies: Negative for polydipsia.  Psychiatric/Behavioral: Negative for depression. The patient is not nervous/anxious.     DRUG ALLERGIES:   Allergies  Allergen Reactions  . Paxil [Paroxetine Hcl] Anaphylaxis    Throat swelled shut  . Penicillins Hives   VITALS:  Blood pressure 98/63, pulse 78, temperature (!) 97.4 F (36.3 C), temperature source Oral, resp. rate 20, height 5\' 5"  (1.651 m), weight 95.7 kg, SpO2 97 %. PHYSICAL EXAMINATION:  Physical Exam Constitutional:      General: She is not in acute distress. HENT:     Head: Normocephalic.     Mouth/Throat:     Mouth: Mucous membranes are moist.  Eyes:     General: No scleral icterus.    Conjunctiva/sclera: Conjunctivae normal.     Pupils: Pupils are equal, round, and reactive to light.  Neck:     Musculoskeletal: Normal range of  motion and neck supple.     Vascular: No JVD.     Trachea: No tracheal deviation.  Cardiovascular:     Rate and Rhythm: Normal rate and regular rhythm.     Heart sounds: Normal heart sounds. No murmur. No gallop.   Pulmonary:     Effort: Pulmonary effort is normal. No respiratory distress.     Breath sounds: Normal breath sounds. No wheezing or rales.  Abdominal:     General: Bowel sounds are normal. There is no distension.     Palpations: Abdomen is soft.     Tenderness: There is no abdominal tenderness. There is no rebound.  Musculoskeletal: Normal range of motion.        General: No tenderness.     Right lower leg: No edema.     Left lower leg: No edema.  Skin:    Findings: No erythema or rash.  Neurological:     General: No focal deficit present.     Mental Status: She is alert and oriented to person, place, and time.     Cranial Nerves: No cranial nerve deficit.  Psychiatric:        Mood and Affect: Mood normal.    LABORATORY PANEL:  Female CBC Recent Labs  Lab 01/18/18 0431  WBC 7.9  HGB 12.0  HCT 36.7  PLT 314   ------------------------------------------------------------------------------------------------------------------ Chemistries  Recent Labs  Lab 01/14/18 1714 01/18/18 0431 01/19/18 0418  NA 138 140 140  K 3.8 3.3* 3.8  CL 108 113* 113*  CO2 23 21* 21*  GLUCOSE 91 98 98  BUN 20 18 17   CREATININE 0.74 0.64 0.77  CALCIUM 9.5 8.5* 8.7*  MG  --  2.0  --   AST 17  --   --   ALT 12  --   --   ALKPHOS 74  --   --   BILITOT 0.4  --   --    RADIOLOGY:  Mr Cervical Spine Wo Contrast  Result Date: 01/18/2018 CLINICAL DATA:  Initial evaluation for continued left upper extremity weakness. EXAM: MRI CERVICAL SPINE WITHOUT CONTRAST TECHNIQUE: Multiplanar, multisequence MR imaging of the cervical spine was performed. No intravenous contrast was administered. COMPARISON:  Comparison made with previous brain MRI from 01/17/2018. FINDINGS: Alignment:  Straightening of the normal cervical lordosis. No listhesis or malalignment. Vertebrae: Vertebral body heights well maintained without evidence for acute or chronic fracture. Bone marrow signal intensity within normal limits. No discrete or worrisome osseous lesions. No abnormal marrow edema. Cord: Signal intensity within the cervical spinal cord is normal. No cord signal abnormality to suggest demyelinating disease. Normal cord caliber and morphology. Posterior Fossa, vertebral arteries, paraspinal tissues: Visualized brain within normal limits. Craniocervical junction normal. Paraspinous and prevertebral soft tissues within normal limits. Normal intravascular flow voids seen within the vertebral arteries bilaterally. Disc levels: C2-C3: Unremarkable. C3-C4:  Unremarkable. C4-C5:  Unremarkable. C5-C6:  Unremarkable. C6-C7: Mild annular disc bulge. No significant canal or foraminal stenosis. C7-T1:  Unremarkable. Visualized upper thoracic spine is unremarkable. IMPRESSION: 1. Normal MRI appearance of the cervical spinal cord. No findings to suggest demyelinating disease or other abnormality. 2. Minor noncompressive disc bulging at C6-7 without stenosis or impingement. Otherwise unremarkable MRI of the cervical spine. Electronically Signed   By: Jeannine Boga M.D.   On: 01/18/2018 21:21   ASSESSMENT AND PLAN:   Thiamine deficiency- has persisted despite oral supplementation as an outpatient. Patient is s/p gastric bypass surgery. PCP with concern that patient may have dry beriberi. Per Dr. Doy Mince, thiamine 500 mg IV tid for 48 hours then to 50 mg IV daily for 5 days, then transition back to po thiamine.  Left hand weakness/numbness/pain- also having other vague neurological symptoms like tingling in both hands and feet and vision changes.  Symptoms improved. MRI brain and cervical spine are unremarkable.  Hypothyroidism- stable, recent TSH normal -Continue home thyroid med  Chronic left knee  pain- s/p total knee replacement -Continue voltaren gel  I discussed with Dr. Doy Mince. All the records are reviewed and case discussed with Care Management/Social Worker. Management plans discussed with the patient, family and they are in agreement.  CODE STATUS: Full Code  TOTAL TIME TAKING CARE OF THIS PATIENT: 26 minutes.   More than 50% of the time was spent in counseling/coordination of care: YES  POSSIBLE D/C IN 1-2 DAYS, DEPENDING ON CLINICAL CONDITION.   Demetrios Loll M.D on 01/19/2018 at 1:50 PM  Between 7am to 6pm - Pager - [phone]  After 6pm go to [email or web address] - [facility]

## 2018-01-19 NOTE — Plan of Care (Signed)

## 2018-01-19 NOTE — Progress Notes (Signed)
Subjective: Patient reports continued improvement in her paresthesias but has had no improvement in her LUE complaints of weakness.  Objective: Current vital signs: BP (!) 142/49 (BP Location: Left Arm)   Pulse 71   Temp 97.9 F (36.6 C) (Oral)   Resp 20   Ht 5\' 5"  (1.651 m)   Wt 95.7 kg   SpO2 98%   BMI 35.11 kg/m  Vital signs in last 24 hours: Temp:  [97.9 F (36.6 C)-98.4 F (36.9 C)] 97.9 F (36.6 C) (01/11 0950) Pulse Rate:  [63-90] 71 (01/11 0950) Resp:  [16-20] 20 (01/11 0950) BP: (93-142)/(49-82) 142/49 (01/11 0950) SpO2:  [96 %-98 %] 98 % (01/11 0950)  Intake/Output from previous day: 01/10 0701 - 01/11 0700 In: 53.5 [I.V.:3.5; IV Piggyback:50] Out: -  Intake/Output this shift: Total I/O In: 53.1 [I.V.:3.1; IV Piggyback:50] Out: -  Nutritional status:  Diet Order            Diet regular Room service appropriate? Yes; Fluid consistency: Thin  Diet effective now              Neurologic Exam: Mental Status: Alert, oriented, thought content appropriate. Speech fluent without evidence of aphasia. Able to follow 3 step commands without difficulty. Attention span and concentration seemed appropriate  Cranial Nerves: II: Discs flat bilaterally; Visual fields grossly normal, pupils equal, round, reactive to light and accommodation III,IV, VI: ptosis present in left eye, extra-ocular motions intact bilaterally V,VII: smile symmetric, facial light touch sensationintact VIII: hearing normal bilaterally IX,X: gag reflex present XI: bilateral shoulder shrug XII: midline tongue extension Motor: Patient able to lift both upper extremities against gravity with no drift noted.    Lab Results: Basic Metabolic Panel: Recent Labs  Lab 01/14/18 1714 01/18/18 0431 01/19/18 0418  NA 138 140 140  K 3.8 3.3* 3.8  CL 108 113* 113*  CO2 23 21* 21*  GLUCOSE 91 98 98  BUN 20 18 17   CREATININE 0.74 0.64 0.77  CALCIUM 9.5 8.5* 8.7*  MG  --  2.0  --     Liver  Function Tests: Recent Labs  Lab 01/14/18 1714  AST 17  ALT 12  ALKPHOS 74  BILITOT 0.4  PROT 7.1  ALBUMIN 4.3   No results for input(s): LIPASE, AMYLASE in the last 168 hours. No results for input(s): AMMONIA in the last 168 hours.  CBC: Recent Labs  Lab 01/14/18 1714 01/18/18 0431  WBC 9.6 7.9  HGB 13.6 12.0  HCT 41.3 36.7  MCV 97.3 98.1  PLT 373.0 314    Cardiac Enzymes: Recent Labs  Lab 01/14/18 1714  CKTOTAL 54    Lipid Panel: No results for input(s): CHOL, TRIG, HDL, CHOLHDL, VLDL, LDLCALC in the last 168 hours.  CBG: No results for input(s): GLUCAP in the last 168 hours.  Microbiology: Results for orders placed or performed during the hospital encounter of 05/14/15  Surgical pcr screen     Status: None   Collection Time: 05/14/15 10:18 AM  Result Value Ref Range Status   MRSA, PCR NEGATIVE NEGATIVE Final   Staphylococcus aureus NEGATIVE NEGATIVE Final    Comment:        The Xpert SA Assay (FDA approved for NASAL specimens in patients over 89 years of age), is one component of a comprehensive surveillance program.  Test performance has been validated by [facility] for patients greater than or equal to 61 year old. It is not intended to diagnose infection nor to guide or monitor  treatment.     Coagulation Studies: No results for input(s): LABPROT, INR in the last 72 hours.  Imaging: Mr Jeri Cos And Wo Contrast  Result Date: 01/17/2018 CLINICAL DATA:  Multiple neurologic symptoms. Rule out multiple sclerosis. EXAM: MRI HEAD WITHOUT AND WITH CONTRAST TECHNIQUE: Multiplanar, multiecho pulse sequences of the brain and surrounding structures were obtained without and with intravenous contrast. CONTRAST:  9.5 mL Gadovist IV COMPARISON:  None. FINDINGS: Brain: Ventricle size normal. Cerebral volume normal. Negative for acute or chronic infarct. Normal white matter. Normal brainstem and basal ganglia. Negative for hemorrhage or mass. Normal enhancement  postcontrast infusion. Vascular: Normal arterial flow void Skull and upper cervical spine: Negative Sinuses/Orbits: Air-fluid level right maxillary sinus. Remaining sinuses clear. Normal orbit Other: None IMPRESSION: Normal MRI brain without with contrast. No evidence of demyelinating disease. Electronically Signed   By: Franchot Gallo M.D.   On: 01/17/2018 17:13   Mr Cervical Spine Wo Contrast  Result Date: 01/18/2018 CLINICAL DATA:  Initial evaluation for continued left upper extremity weakness. EXAM: MRI CERVICAL SPINE WITHOUT CONTRAST TECHNIQUE: Multiplanar, multisequence MR imaging of the cervical spine was performed. No intravenous contrast was administered. COMPARISON:  Comparison made with previous brain MRI from 01/17/2018. FINDINGS: Alignment: Straightening of the normal cervical lordosis. No listhesis or malalignment. Vertebrae: Vertebral body heights well maintained without evidence for acute or chronic fracture. Bone marrow signal intensity within normal limits. No discrete or worrisome osseous lesions. No abnormal marrow edema. Cord: Signal intensity within the cervical spinal cord is normal. No cord signal abnormality to suggest demyelinating disease. Normal cord caliber and morphology. Posterior Fossa, vertebral arteries, paraspinal tissues: Visualized brain within normal limits. Craniocervical junction normal. Paraspinous and prevertebral soft tissues within normal limits. Normal intravascular flow voids seen within the vertebral arteries bilaterally. Disc levels: C2-C3: Unremarkable. C3-C4:  Unremarkable. C4-C5:  Unremarkable. C5-C6:  Unremarkable. C6-C7: Mild annular disc bulge. No significant canal or foraminal stenosis. C7-T1:  Unremarkable. Visualized upper thoracic spine is unremarkable. IMPRESSION: 1. Normal MRI appearance of the cervical spinal cord. No findings to suggest demyelinating disease or other abnormality. 2. Minor noncompressive disc bulging at C6-7 without stenosis or  impingement. Otherwise unremarkable MRI of the cervical spine. Electronically Signed   By: Jeannine Boga M.D.   On: 01/18/2018 21:21    Medications:  I have reviewed the patient's current medications. Scheduled: . diclofenac sodium  2 g Topical QID  . enoxaparin (LOVENOX) injection  40 mg Subcutaneous Q24H  . loratadine  10 mg Oral Daily  . multivitamins with iron  1 tablet Oral Daily  . thyroid  120 mg Oral QAC breakfast    Assessment/Plan: Patient with improving paresthesias.  Continued LUE complaints although no drift noted on UE testing.  MRI of the cervical spine sows no etiology for patient's complaints.  Possibly secondary to deficiencies.    Recommendations: 1. Agree with supplementation 2. Patient to follow up with neurology on an outpatient basis for possible NCV/EMG testing if LUE complaints continue.   LOS: 2 days   Alexis Goodell, MD Neurology [phone] 01/19/2018  11:30 AM

## 2018-01-20 MED ORDER — THIAMINE HCL 100 MG/ML IJ SOLN
250.0000 mg | INTRAVENOUS | Status: DC
  Administered 2018-01-20 – 2018-01-23 (×4): 250 mg via INTRAVENOUS
  Filled 2018-01-20 (×5): qty 2.5

## 2018-01-20 NOTE — Progress Notes (Signed)
[facility] at [place] NAME: Whitney Hill    MR#:  [id number]  DATE OF BIRTH:  1970-12-03  SUBJECTIVE:  CHIEF COMPLAINT:   Chief Complaint  Patient presents with  . Thiamine infusion   Patient has no complaints except left elbow pain which is chronic. REVIEW OF SYSTEMS:  Review of Systems  Constitutional: Negative for chills, fever and malaise/fatigue.  HENT: Negative for sore throat.   Eyes: Negative for blurred vision and double vision.  Respiratory: Negative for cough, hemoptysis, shortness of breath, wheezing and stridor.   Cardiovascular: Negative for chest pain, palpitations, orthopnea and leg swelling.  Gastrointestinal: Negative for abdominal pain, blood in stool, diarrhea, melena, nausea and vomiting.  Genitourinary: Negative for dysuria, flank pain and hematuria.  Musculoskeletal: Positive for joint pain. Negative for back pain.  Skin: Negative for itching and rash.  Neurological: Negative for dizziness, sensory change, focal weakness, seizures, loss of consciousness, weakness and headaches.  Endo/Heme/Allergies: Negative for polydipsia.  Psychiatric/Behavioral: Negative for depression. The patient is not nervous/anxious.     DRUG ALLERGIES:   Allergies  Allergen Reactions  . Paxil [Paroxetine Hcl] Anaphylaxis    Throat swelled shut  . Penicillins Hives   VITALS:  Blood pressure 98/60, pulse 62, temperature 97.8 F (36.6 C), temperature source Oral, resp. rate 18, height 5\' 5"  (1.651 m), weight 95.7 kg, SpO2 96 %. PHYSICAL EXAMINATION:  Physical Exam Constitutional:      General: She is not in acute distress. HENT:     Head: Normocephalic.     Mouth/Throat:     Mouth: Mucous membranes are moist.  Eyes:     General: No scleral icterus.    Conjunctiva/sclera: Conjunctivae normal.     Pupils: Pupils are equal, round, and reactive to light.  Neck:     Musculoskeletal: Normal range of motion and neck supple.     Vascular: No JVD.     Trachea: No tracheal deviation.  Cardiovascular:     Rate and Rhythm: Normal rate and regular rhythm.     Heart sounds: Normal heart sounds. No murmur. No gallop.   Pulmonary:     Effort: Pulmonary effort is normal. No respiratory distress.     Breath sounds: Normal breath sounds. No wheezing or rales.  Abdominal:     General: Bowel sounds are normal. There is no distension.     Palpations: Abdomen is soft.     Tenderness: There is no abdominal tenderness. There is no rebound.  Musculoskeletal: Normal range of motion.        General: No tenderness.     Right lower leg: No edema.     Left lower leg: No edema.  Skin:    Findings: No erythema or rash.  Neurological:     General: No focal deficit present.     Mental Status: She is alert and oriented to person, place, and time.     Cranial Nerves: No cranial nerve deficit.  Psychiatric:        Mood and Affect: Mood normal.    LABORATORY PANEL:  Female CBC Recent Labs  Lab 01/18/18 0431  WBC 7.9  HGB 12.0  HCT 36.7  PLT 314   ------------------------------------------------------------------------------------------------------------------ Chemistries  Recent Labs  Lab 01/14/18 1714 01/18/18 0431 01/19/18 0418  NA 138 140 140  K 3.8 3.3* 3.8  CL 108 113* 113*  CO2 23 21* 21*  GLUCOSE 91 98 98  BUN 20 18 17   CREATININE 0.74  0.64 0.77  CALCIUM 9.5 8.5* 8.7*  MG  --  2.0  --   AST 17  --   --   ALT 12  --   --   ALKPHOS 74  --   --   BILITOT 0.4  --   --    RADIOLOGY:  No results found. ASSESSMENT AND PLAN:   Thiamine deficiency- has persisted despite oral supplementation as an outpatient. Patient is s/p gastric bypass surgery. PCP with concern that patient may have dry beriberi. Per Dr. Doy Mince, thiamine 500 mg IV tid for 48 hours then to 50 mg IV daily for 5 days, then transition back to po thiamine.  Left hand weakness/numbness/pain- also having other vague neurological symptoms like  tingling in both hands and feet and vision changes.  Symptoms improved. MRI brain and cervical spine are unremarkable.  Hypothyroidism- stable, recent TSH normal -Continue home thyroid med  Chronic left knee pain- s/p total knee replacement -Continue voltaren gel  All the records are reviewed and case discussed with Care Management/Social Worker. Management plans discussed with the patient, family and they are in agreement.  CODE STATUS: Full Code  TOTAL TIME TAKING CARE OF THIS PATIENT: 20 minutes.   More than 50% of the time was spent in counseling/coordination of care: YES  POSSIBLE D/C IN 1 DAYS, DEPENDING ON CLINICAL CONDITION.   Demetrios Loll M.D on 01/20/2018 at 11:43 AM  Between 7am to 6pm - Pager - [phone]  After 6pm go to [email or web address] - [facility]

## 2018-01-21 ENCOUNTER — Telehealth: Payer: Self-pay

## 2018-01-21 MED ORDER — HYDROXYZINE HCL 25 MG PO TABS: 25.0000 mg | ORAL_TABLET | Freq: Three times a day (TID) | ORAL | 0 refills | Status: DC | PRN

## 2018-01-21 NOTE — Care Management Note (Signed)
Case Management Note  Patient Details  Name: Whitney Hill MRN: [id number] Date of Birth: 22-Mar-1970  Subjective/Objective:    Admitted to [facility] with the diagnosis of Thiamine level deficiency. Lives with husband, Whitney Hill [phone]). Last seen Dr. Biagio Quint 01/14/18. Prescriptions are filled at   [facility] [place].  Home Health 2.5 years ago following knee replacement. No home Health. No home oxygen. Bedside commode and rolling walker in the home if needed,  Takes care of all basic activities of daily living herself.             Action/Plan: Thiamine level <6 on 01/14/18. Requested another level to be drawn.  Peripheral IV line in place.  Will continue to follow for transition of care needs.   Expected Discharge Date:  01/21/18               Expected Discharge Plan:     In-House Referral:   yes  Discharge planning Services    yes Post Acute Care Choice:    Choice offered to:     DME Arranged:    DME Agency:     HH Arranged:    HH Agency:     Status of Service:     If discussed at [employer], dates discussed:    Additional Comments:  Shelbie Ammons, RN MSN CCM Care Management [phone] 01/21/2018, 10:00 AM

## 2018-01-21 NOTE — Clinical Social Work Note (Signed)
CSW received consult for placement. PT has not seen patient. Per MD patient will be going home. CSW will watch for needs.   [place], [place]

## 2018-01-21 NOTE — Progress Notes (Signed)
[place] at [place] NAME: Whitney Hill    MR#:  [id number]  DATE OF BIRTH:  Jan 28, 1970  SUBJECTIVE:  CHIEF COMPLAINT:   Chief Complaint  Patient presents with  . Thiamine infusion   Patient has better left elbow pain. REVIEW OF SYSTEMS:  Review of Systems  Constitutional: Negative for chills, fever and malaise/fatigue.  HENT: Negative for sore throat.   Eyes: Negative for blurred vision and double vision.  Respiratory: Negative for cough, hemoptysis, shortness of breath, wheezing and stridor.   Cardiovascular: Negative for chest pain, palpitations, orthopnea and leg swelling.  Gastrointestinal: Negative for abdominal pain, blood in stool, diarrhea, melena, nausea and vomiting.  Genitourinary: Negative for dysuria, flank pain and hematuria.  Musculoskeletal: Positive for joint pain. Negative for back pain.  Skin: Negative for itching and rash.  Neurological: Negative for dizziness, sensory change, focal weakness, seizures, loss of consciousness, weakness and headaches.  Endo/Heme/Allergies: Negative for polydipsia.  Psychiatric/Behavioral: Negative for depression. The patient is not nervous/anxious.     DRUG ALLERGIES:   Allergies  Allergen Reactions  . Paxil [Paroxetine Hcl] Anaphylaxis    Throat swelled shut  . Penicillins Hives   VITALS:  Blood pressure 96/60, pulse 66, temperature 98.3 F (36.8 C), temperature source Oral, resp. rate 18, height 5\' 5"  (1.651 m), weight 95.7 kg, SpO2 96 %. PHYSICAL EXAMINATION:  Physical Exam Constitutional:      General: She is not in acute distress. HENT:     Head: Normocephalic.     Mouth/Throat:     Mouth: Mucous membranes are moist.  Eyes:     General: No scleral icterus.    Conjunctiva/sclera: Conjunctivae normal.     Pupils: Pupils are equal, round, and reactive to light.  Neck:     Musculoskeletal: Normal range of motion and neck supple.     Vascular: No JVD.   Trachea: No tracheal deviation.  Cardiovascular:     Rate and Rhythm: Normal rate and regular rhythm.     Heart sounds: Normal heart sounds. No murmur. No gallop.   Pulmonary:     Effort: Pulmonary effort is normal. No respiratory distress.     Breath sounds: Normal breath sounds. No wheezing or rales.  Abdominal:     General: Bowel sounds are normal. There is no distension.     Palpations: Abdomen is soft.     Tenderness: There is no abdominal tenderness. There is no rebound.  Musculoskeletal: Normal range of motion.        General: No tenderness.     Right lower leg: No edema.     Left lower leg: No edema.  Skin:    Findings: No erythema or rash.  Neurological:     General: No focal deficit present.     Mental Status: She is alert and oriented to person, place, and time.     Cranial Nerves: No cranial nerve deficit.  Psychiatric:        Mood and Affect: Mood normal.    LABORATORY PANEL:  Female CBC Recent Labs  Lab 01/18/18 0431  WBC 7.9  HGB 12.0  HCT 36.7  PLT 314   ------------------------------------------------------------------------------------------------------------------ Chemistries  Recent Labs  Lab 01/14/18 1714 01/18/18 0431 01/19/18 0418  NA 138 140 140  K 3.8 3.3* 3.8  CL 108 113* 113*  CO2 23 21* 21*  GLUCOSE 91 98 98  BUN 20 18 17   CREATININE 0.74 0.64 0.77  CALCIUM 9.5 8.5* 8.7*  MG  --  2.0  --   AST 17  --   --   ALT 12  --   --   ALKPHOS 74  --   --   BILITOT 0.4  --   --    RADIOLOGY:  No results found. ASSESSMENT AND PLAN:   Thiamine deficiency- has persisted despite oral supplementation as an outpatient. Patient is s/p gastric bypass surgery. PCP with concern that patient may have dry beriberi. Per Dr. Doy Mince, thiamine 500 mg IV tid for 48 hours then to 50 mg IV daily for 5 days, then transition back to po thiamine.  Left hand weakness/numbness/pain- also having other vague neurological symptoms like tingling in both hands and  feet and vision changes.  Symptoms improved. MRI brain and cervical spine are unremarkable.  Hypothyroidism- stable, recent TSH normal -Continue home thyroid med  Chronic left knee pain- s/p total knee replacement -Continue voltaren gel  Discussed with neurology NP. All the records are reviewed and case discussed with Care Management/Social Worker. Management plans discussed with the patient, family and they are in agreement.  CODE STATUS: Full Code  TOTAL TIME TAKING CARE OF THIS PATIENT: 27 minutes.   More than 50% of the time was spent in counseling/coordination of care: YES  POSSIBLE D/C IN 3 DAYS, DEPENDING ON CLINICAL CONDITION.   Demetrios Loll M.D on 01/21/2018 at 2:56 PM  Between 7am to 6pm - Pager - [phone]  After 6pm go to [email or web address] - [facility]

## 2018-01-21 NOTE — Telephone Encounter (Signed)
Copied from [place] [phone]. Topic: Appointment Scheduling - Scheduling Inquiry for Clinic >> Jan 21, 2018  8:29 AM Conception Chancy, NT wrote: Reason for CRM: [facility] is calling and states the patient is being discharged today and is needing a hospital follow up sooner than 02/08/18 which is Dr. Caryl Bis first available appointment. Please advise.

## 2018-01-22 NOTE — Care Management (Signed)
The only thiamine level present in Epic for this admission.  There is a result from 3 days prior to admission. There is an order for a level on 1/10 and one for 1/13. No results present.  Spoke with Orlinda in the lab and specimens have to be sent  out and takes 4-6 days to result.

## 2018-01-22 NOTE — Progress Notes (Signed)
[place] at [place] NAME: Whitney Hill    MR#:  [id number]  DATE OF BIRTH:  [birth year]  SUBJECTIVE:  CHIEF COMPLAINT:   Chief Complaint  Patient presents with  . Thiamine infusion   Patient has better left elbow pain. REVIEW OF SYSTEMS:  Review of Systems  Constitutional: Negative for chills, fever and malaise/fatigue.  HENT: Negative for sore throat.   Eyes: Negative for blurred vision and double vision.  Respiratory: Negative for cough, hemoptysis, shortness of breath, wheezing and stridor.   Cardiovascular: Negative for chest pain, palpitations, orthopnea and leg swelling.  Gastrointestinal: Negative for abdominal pain, blood in stool, diarrhea, melena, nausea and vomiting.  Genitourinary: Negative for dysuria, flank pain and hematuria.  Musculoskeletal: Positive for joint pain. Negative for back pain.  Skin: Negative for itching and rash.  Neurological: Negative for dizziness, sensory change, focal weakness, seizures, loss of consciousness, weakness and headaches.  Endo/Heme/Allergies: Negative for polydipsia.  Psychiatric/Behavioral: Negative for depression. The patient is not nervous/anxious.     DRUG ALLERGIES:   Allergies  Allergen Reactions  . Paxil [Paroxetine Hcl] Anaphylaxis    Throat swelled shut  . Penicillins Hives   VITALS:  Blood pressure 118/76, pulse 82, temperature 98 F (36.7 C), temperature source Oral, resp. rate 20, height 5\' 5"  (1.651 m), weight 95.7 kg, SpO2 99 %. PHYSICAL EXAMINATION:  Physical Exam Constitutional:      General: She is not in acute distress. HENT:     Head: Normocephalic.     Mouth/Throat:     Mouth: Mucous membranes are moist.  Eyes:     General: No scleral icterus.    Conjunctiva/sclera: Conjunctivae normal.     Pupils: Pupils are equal, round, and reactive to light.  Neck:     Musculoskeletal: Normal range of motion and neck supple.     Vascular: No JVD.   Trachea: No tracheal deviation.  Cardiovascular:     Rate and Rhythm: Normal rate and regular rhythm.     Heart sounds: Normal heart sounds. No murmur. No gallop.   Pulmonary:     Effort: Pulmonary effort is normal. No respiratory distress.     Breath sounds: Normal breath sounds. No wheezing or rales.  Abdominal:     General: Bowel sounds are normal. There is no distension.     Palpations: Abdomen is soft.     Tenderness: There is no abdominal tenderness. There is no rebound.  Musculoskeletal: Normal range of motion.        General: No tenderness.     Right lower leg: No edema.     Left lower leg: No edema.  Skin:    Findings: No erythema or rash.  Neurological:     General: No focal deficit present.     Mental Status: She is alert and oriented to person, place, and time.     Cranial Nerves: No cranial nerve deficit.  Psychiatric:        Mood and Affect: Mood normal.    LABORATORY PANEL:  Female CBC Recent Labs  Lab 01/18/18 0431  WBC 7.9  HGB 12.0  HCT 36.7  PLT 314   ------------------------------------------------------------------------------------------------------------------ Chemistries  Recent Labs  Lab 01/18/18 0431 01/19/18 0418  NA 140 140  K 3.3* 3.8  CL 113* 113*  CO2 21* 21*  GLUCOSE 98 98  BUN 18 17  CREATININE 0.64 0.77  CALCIUM 8.5* 8.7*  MG 2.0  --    RADIOLOGY:  No results found. ASSESSMENT AND PLAN:   Thiamine deficiency- has persisted despite oral supplementation as an outpatient. Patient is s/p gastric bypass surgery. PCP with concern that patient may have dry beriberi. Per Dr. Doy Mince, thiamine 500 mg IV tid for 48 hours then to 50 mg IV daily for 5 days (2 more days), then transition back to po thiamine.  Left hand weakness/numbness/pain- also having other vague neurological symptoms like tingling in both hands and feet and vision changes.  Symptoms improved. MRI brain and cervical spine are unremarkable.  Hypothyroidism- stable,  recent TSH normal -Continue home thyroid med  Chronic left knee pain- s/p total knee replacement -Continue voltaren gel  All the records are reviewed and case discussed with Care Management/Social Worker. Management plans discussed with the patient, family and they are in agreement.  CODE STATUS: Full Code  TOTAL TIME TAKING CARE OF THIS PATIENT: 20 minutes.   More than 50% of the time was spent in counseling/coordination of care: YES  POSSIBLE D/C IN 2 DAYS, DEPENDING ON CLINICAL CONDITION.   Demetrios Loll M.D on 01/22/2018 at 1:57 PM  Between 7am to 6pm - Pager - [phone]  After 6pm go to [email or web address] - [facility]

## 2018-01-23 LAB — VITAMIN B1: Vitamin B1 (Thiamine): 300.1 nmol/L — ABNORMAL HIGH (ref 66.5–200.0)

## 2018-01-23 MED ORDER — HYDROXYZINE HCL 25 MG PO TABS: 25.0000 mg | ORAL_TABLET | Freq: Three times a day (TID) | ORAL | 0 refills | Status: DC | PRN

## 2018-01-23 MED ORDER — THIAMINE HCL 100 MG PO TABS: 100.0000 mg | ORAL_TABLET | Freq: Every day | ORAL | Status: DC

## 2018-01-23 NOTE — Discharge Summary (Addendum)
[name] NAME: Whitney Hill    MR#:  [id number]  DATE OF BIRTH:  Feb 20, 1970  DATE OF ADMISSION:  01/17/2018   ADMITTING PHYSICIAN: Sela Hua, MD  DATE OF DISCHARGE: 01/23/2018 PRIMARY CARE PHYSICIAN: Leone Haven, MD   ADMISSION DIAGNOSIS:  Thiamine deficiency [E51.9] Left arm numbness [R20.0] Left arm weakness [R29.898] DISCHARGE DIAGNOSIS:  Active Problems:   Thiamine deficiency  SECONDARY DIAGNOSIS:   Past Medical History:  Diagnosis Date  . Arthritis    OA  . Complication of anesthesia    "ITCHING TO FACE, BENADRYL HELP BUT DROPS BLOOD PRESSURE"  . DVT (deep venous thrombosis) ([place])    following first knee surgery in 2011  . Gastric ulcer   . Headache   . Heterozygous factor V Leiden mutation ([place])   . Hypothyroid   . Iron deficiency anemia    HOSPITAL COURSE:  Thiamine deficiency-has persisted despite oral supplementation as an outpatient.Patient is s/p gastric bypass surgery.PCP with concern that patient may have dry beriberi. Per Dr. Doy Mince, thiamine 500 mg IV tid for 48 hours then to 50 mg IV daily for 5 days (2 more days), then transition back to po thiamine. Thiamine level 300.1. Per neurologist, continue po daily, follow up neurologist as outpatient.  Left hand weakness/numbness/pain-also having other vague neurological symptoms like tingling in both hands and feetand vision changes.  Symptoms improved. MRI brain and cervical spine are unremarkable.  Hypothyroidism-stable, recent TSH normal -Continue home thyroid med  Chronic left knee pain- s/p total knee replacement -Continue voltaren gel DISCHARGE CONDITIONS:  Stable, discharge to home today. CONSULTS OBTAINED:   DRUG ALLERGIES:   Allergies  Allergen Reactions  . Paxil [Paroxetine Hcl] Anaphylaxis    Throat swelled shut  . Penicillins Hives   DISCHARGE MEDICATIONS:   Allergies as of 01/23/2018      Reactions     Paxil [paroxetine Hcl] Anaphylaxis   Throat swelled shut   Penicillins Hives      Medication List    STOP taking these medications   clindamycin 150 MG capsule Commonly known as:  CLEOCIN     TAKE these medications   Cholecalciferol 1.25 MG (50000 UT) capsule Take 1 capsule (50,000 Units total) by mouth once a week.   diclofenac sodium 1 % Gel Commonly known as:  VOLTAREN Apply 2 g topically 4 (four) times daily.   EPINEPHrine 0.3 mg/0.3 mL Soaj injection Commonly known as:  EPI-PEN Inject into the muscle.   hydrOXYzine 25 MG tablet Commonly known as:  ATARAX/VISTARIL Take 1 tablet (25 mg total) by mouth 3 (three) times daily as needed (itching).   multivitamin-iron-minerals-folic acid chewable tablet Chew 1 tablet by mouth daily.   nystatin powder Commonly known as:  nystatin Apply topically 2 (two) times daily.   potassium chloride 10 MEQ tablet Commonly known as:  K-DUR Take 1 tablet (10 mEq total) by mouth daily.   thiamine 100 MG tablet Take 1 tablet (100 mg total) by mouth daily. What changed:  how much to take   thyroid 120 MG tablet Commonly known as:  ARMOUR Take 120 mg by mouth daily before breakfast. What changed:  Another medication with the same name was removed. Continue taking this medication, and follow the directions you see here.   triamcinolone cream 0.1 % Commonly known as:  KENALOG Apply 1 application topically 2 (two) times daily.   Vitamin D (Ergocalciferol) 1.25 MG (50000 UT) Caps capsule Commonly  known as:  DRISDOL Take 1 capsule (50,000 Units total) by mouth every 7 (seven) days.        DISCHARGE INSTRUCTIONS:  See AVS.  If you experience worsening of your admission symptoms, develop shortness of breath, life threatening emergency, suicidal or homicidal thoughts you must seek medical attention immediately by calling 911 or calling your MD immediately  if symptoms less severe.  You Must read complete instructions/literature  along with all the possible adverse reactions/side effects for all the Medicines you take and that have been prescribed to you. Take any new Medicines after you have completely understood and accpet all the possible adverse reactions/side effects.   Please note  You were cared for by a hospitalist during your hospital stay. If you have any questions about your discharge medications or the care you received while you were in the hospital after you are discharged, you can call the unit and asked to speak with the hospitalist on call if the hospitalist that took care of you is not available. Once you are discharged, your primary care physician will handle any further medical issues. Please note that NO REFILLS for any discharge medications will be authorized once you are discharged, as it is imperative that you return to your primary care physician (or establish a relationship with a primary care physician if you do not have one) for your aftercare needs so that they can reassess your need for medications and monitor your lab values.    On the day of Discharge:  VITAL SIGNS:  Blood pressure 94/65, pulse 60, temperature 98.3 F (36.8 C), temperature source Oral, resp. rate 16, height 5\' 5"  (1.651 m), weight 95.7 kg, SpO2 96 %. PHYSICAL EXAMINATION:  GENERAL:  48 y.o.-year-old patient lying in the bed with no acute distress.  EYES: Pupils equal, round, reactive to light and accommodation. No scleral icterus. Extraocular muscles intact.  HEENT: Head atraumatic, normocephalic. Oropharynx and nasopharynx clear.  NECK:  Supple, no jugular venous distention. No thyroid enlargement, no tenderness.  LUNGS: Normal breath sounds bilaterally, no wheezing, rales,rhonchi or crepitation. No use of accessory muscles of respiration.  CARDIOVASCULAR: S1, S2 normal. No murmurs, rubs, or gallops.  ABDOMEN: Soft, non-tender, non-distended. Bowel sounds present. No organomegaly or mass.  EXTREMITIES: No pedal edema,  cyanosis, or clubbing.  NEUROLOGIC: Cranial nerves II through XII are intact. Muscle strength 5/5 in all extremities. Sensation intact. Gait not checked.  PSYCHIATRIC: The patient is alert and oriented x 3.  SKIN: No obvious rash, lesion, or ulcer.  DATA REVIEW:   CBC Recent Labs  Lab 01/18/18 0431  WBC 7.9  HGB 12.0  HCT 36.7  PLT 314    Chemistries  Recent Labs  Lab 01/18/18 0431 01/19/18 0418  NA 140 140  K 3.3* 3.8  CL 113* 113*  CO2 21* 21*  GLUCOSE 98 98  BUN 18 17  CREATININE 0.64 0.77  CALCIUM 8.5* 8.7*  MG 2.0  --      Microbiology Results  Results for orders placed or performed during the hospital encounter of 05/14/15  Surgical pcr screen     Status: None   Collection Time: 05/14/15 10:18 AM  Result Value Ref Range Status   MRSA, PCR NEGATIVE NEGATIVE Final   Staphylococcus aureus NEGATIVE NEGATIVE Final    Comment:        The Xpert SA Assay (FDA approved for NASAL specimens in patients over 84 years of age), is one component of a comprehensive surveillance program.  Test  performance has been validated by [facility] for patients greater than or equal to 44 year old. It is not intended to diagnose infection nor to guide or monitor treatment.     RADIOLOGY:  No results found.   Management plans discussed with the patient, family and they are in agreement.  CODE STATUS: Full Code   TOTAL TIME TAKING CARE OF THIS PATIENT: 32 minutes.    Demetrios Loll M.D on 01/23/2018 at 1:09 PM  Between 7am to 6pm - Pager - [phone]  After 6pm go to [email or web address] - [employer] [place] [facility]  Office  [phone]  CC: Primary care physician; Leone Haven, MD   Note: This dictation was prepared with Dragon dictation along with smaller phrase technology. Any transcriptional errors that result from this process are unintentional.

## 2018-01-23 NOTE — Progress Notes (Signed)
Reviewed AVS with pt. Pt verbalized understanding. Printed RX with AVS

## 2018-01-23 NOTE — Telephone Encounter (Signed)
Patient is still hospitalized.

## 2018-01-24 LAB — VITAMIN B1: Vitamin B1 (Thiamine): 318.3 nmol/L — ABNORMAL HIGH (ref 66.5–200.0)

## 2018-01-25 ENCOUNTER — Telehealth: Payer: Self-pay | Admitting: Family Medicine

## 2018-01-25 NOTE — Telephone Encounter (Signed)
Patient is scheduled for 02/08/18 but this is not with in the 14 day window. Sorry I was able to fin one on 01/30/18

## 2018-01-25 NOTE — Telephone Encounter (Signed)
Transition Care Management Follow-up Telephone Call  How have you been since you were released from the hospital? Feeling better but still weakness in the left arm, not able to lift a Item of 1 -2 pounds with that left arm.   Do you understand why you were in the hospital? yes   Do you understand the discharge instrcutions? yes  Items Reviewed:  Medications reviewed: yes  Allergies reviewed: yes  Dietary changes reviewed: yes  Referrals reviewed: yes   Functional Questionnaire:   Activities of Daily Living (ADLs):   She states they are independent in the following: ambulation, bathing and hygiene, feeding, continence, grooming, toileting and dressing States they require assistance with the following: No assistance    Any transportation issues/concerns?: no   Any patient concerns? no   Confirmed importance and date/time of follow-up visits scheduled: yes   Confirmed with patient if condition begins to worsen call PCP or go to the ER.  Patient was given the Call-a-Nurse line [phone]: yes

## 2018-01-29 ENCOUNTER — Telehealth: Payer: Self-pay

## 2018-01-29 NOTE — Telephone Encounter (Signed)
Called patient and left a detailed VM advised patient to call back if she would like to set up this appt. CRM created and sent to [facility] pool.

## 2018-01-29 NOTE — Telephone Encounter (Signed)
Sent to PCP to see if we can see patient sooner?

## 2018-01-29 NOTE — Telephone Encounter (Signed)
Copied from [place] [phone]. Topic: General - Other >> Jan 28, 2018  5:13 PM Ivar Drape wrote: Reason for CRM:   Patient cannot make the appt she had on Wes 01/30/2018 because the patient has an appt to see the allergist the same day at 9:30am.  She has another appt on 02/08/2018 but she would like to get in sooner to see the provider.  Please advise.

## 2018-01-29 NOTE — Telephone Encounter (Signed)
I could see her on 02/04/2018 at 430.

## 2018-01-30 ENCOUNTER — Encounter: Payer: Self-pay | Admitting: Family Medicine

## 2018-01-30 ENCOUNTER — Inpatient Hospital Stay: Payer: Commercial Managed Care - PPO | Admitting: Family Medicine

## 2018-01-30 DIAGNOSIS — J3089 Other allergic rhinitis: Secondary | ICD-10-CM | POA: Diagnosis not present

## 2018-01-30 DIAGNOSIS — T783XXD Angioneurotic edema, subsequent encounter: Secondary | ICD-10-CM | POA: Diagnosis not present

## 2018-01-30 DIAGNOSIS — L501 Idiopathic urticaria: Secondary | ICD-10-CM | POA: Diagnosis not present

## 2018-01-30 NOTE — Telephone Encounter (Signed)
Communicating with patient through [place] in regards to this issue.

## 2018-02-04 ENCOUNTER — Ambulatory Visit (INDEPENDENT_AMBULATORY_CARE_PROVIDER_SITE_OTHER): Payer: Commercial Managed Care - PPO | Admitting: Family Medicine

## 2018-02-04 ENCOUNTER — Encounter: Payer: Self-pay | Admitting: Family Medicine

## 2018-02-04 ENCOUNTER — Ambulatory Visit (INDEPENDENT_AMBULATORY_CARE_PROVIDER_SITE_OTHER): Payer: Commercial Managed Care - PPO

## 2018-02-04 VITALS — BP 114/72 | HR 63 | Temp 98.0°F | Ht 65.0 in | Wt 215.2 lb

## 2018-02-04 DIAGNOSIS — M25522 Pain in left elbow: Secondary | ICD-10-CM

## 2018-02-04 DIAGNOSIS — E519 Thiamine deficiency, unspecified: Secondary | ICD-10-CM

## 2018-02-04 DIAGNOSIS — L509 Urticaria, unspecified: Secondary | ICD-10-CM

## 2018-02-04 DIAGNOSIS — E876 Hypokalemia: Secondary | ICD-10-CM

## 2018-02-04 NOTE — Patient Instructions (Signed)
Nice to see you.  We will check labs and an x-ray today.  

## 2018-02-05 DIAGNOSIS — E876 Hypokalemia: Secondary | ICD-10-CM | POA: Insufficient documentation

## 2018-02-05 DIAGNOSIS — M25522 Pain in left elbow: Secondary | ICD-10-CM | POA: Insufficient documentation

## 2018-02-05 LAB — BASIC METABOLIC PANEL
BUN: 20 mg/dL (ref 6–23)
CO2: 20 meq/L (ref 19–32)
Calcium: 9.6 mg/dL (ref 8.4–10.5)
Chloride: 109 mEq/L (ref 96–112)
Creatinine, Ser: 0.73 mg/dL (ref 0.40–1.20)
GFR: 84.43 mL/min (ref >60.00)
Glucose, Bld: 93 mg/dL (ref 70–99)
Potassium: 3.7 mEq/L (ref 3.5–5.1)
Sodium: 140 mEq/L (ref 135–145)

## 2018-02-05 NOTE — Assessment & Plan Note (Signed)
I suspect this is related to medial and lateral epicondylitis.  Will obtain an x-ray to rule out underlying bony causes.  Discussed obtaining a brace to wear for this.

## 2018-02-05 NOTE — Assessment & Plan Note (Signed)
She will continue to see her allergist.

## 2018-02-05 NOTE — Assessment & Plan Note (Signed)
Recheck potassium 

## 2018-02-05 NOTE — Assessment & Plan Note (Signed)
Patient's prior symptoms concerning for dry beriberi.  Symptoms have improved with thiamine supplementation.  She will see neurology as planned.  We will recheck her thiamine level.

## 2018-02-05 NOTE — Progress Notes (Signed)
Tommi Rumps, MD Phone: [phone]  Whitney Hill is a 48 y.o. female who presents today for hospital follow-up.  CC: Thiamine deficiency, left arm pain, hives  Patient was hospitalized 01/17/2018-01/23/2018 related to thiamine deficiency and various neurological complaints.  Patient had numbness and tingling in both hands and feet as well as prior vision changes, swelling, and scattered muscle and joint pains.  She was found to be thiamine deficient previously and was initially asymptomatic.  Oral supplementation was attempted though she had no improvement and subsequently developed the above-mentioned symptoms.  She was hospitalized for IV thiamine.  She had evaluation with MRI brain and cervical spine that were both unremarkable for cause of her symptoms.  The patient reports her weakness, tingling, swelling, and muscle and joint pains have resolved.  She continues to have pain at her left elbow.  She is back on oral thiamine.  She has an appointment with an outpatient neurologist.  She reports continued left elbow pain that particularly bothers her with extension and certain rotational movements.  She also notes she got into see her allergist for her prior hives as they became quite extensive.  She received Xolair and the hives have resolved.  She follows up with her allergist in about 4 months.  Discharge summary reviewed.  Medications reviewed.  Social History   Tobacco Use  Smoking Status Never Smoker  Smokeless Tobacco Never Used     ROS see history of present illness  Objective  Physical Exam Vitals:   02/04/18 1605  BP: 114/72  Pulse: 63  Temp: 98 F (36.7 C)  SpO2: 97%    BP Readings from Last 3 Encounters:  02/04/18 114/72  01/23/18 100/67  01/14/18 122/64   Wt Readings from Last 3 Encounters:  02/04/18 215 lb 3.2 oz (97.6 kg)  01/17/18 211 lb (95.7 kg)  01/14/18 211 lb (95.7 kg)    Physical Exam Constitutional:      General: She is not in acute  distress.    Appearance: She is not diaphoretic.  Cardiovascular:     Rate and Rhythm: Normal rate and regular rhythm.     Heart sounds: Normal heart sounds.  Pulmonary:     Effort: Pulmonary effort is normal.     Breath sounds: Normal breath sounds.  Musculoskeletal:     Right lower leg: No edema.     Left lower leg: No edema.     Comments: Left elbow with tenderness over the medial and lateral epicondyle areas, discomfort on resisted pronation and supination in these areas, no apparent swelling  Skin:    General: Skin is warm and dry.  Neurological:     Mental Status: She is alert.      Assessment/Plan: Please see individual problem list.  Vitamin B1 deficiency Patient's prior symptoms concerning for dry beriberi.  Symptoms have improved with thiamine supplementation.  She will see neurology as planned.  We will recheck her thiamine level.  Left elbow pain I suspect this is related to medial and lateral epicondylitis.  Will obtain an x-ray to rule out underlying bony causes.  Discussed obtaining a brace to wear for this.  Hypokalemia Recheck potassium.  Hives She will continue to see her allergist.    Orders Placed This Encounter  Procedures  . DG Elbow Complete Left    Standing Status:   Future    Number of Occurrences:   1    Standing Expiration Date:   04/05/2019    Order Specific Question:  Reason for Exam (SYMPTOM  OR DIAGNOSIS REQUIRED)    Answer:   left elbow pain for several months, no injury no improvement    Order Specific Question:   Is patient pregnant?    Answer:   No    Order Specific Question:   Preferred imaging location?    Answer:   [facility] Specific Question:   Radiology Contrast Protocol - do NOT remove file path    Answer:   \\[email or web address]  . Vitamin B1, whole blood  . Basic Metabolic Panel (BMET)    No orders of the defined types were placed in this  encounter.    Tommi Rumps, MD [place]

## 2018-02-08 ENCOUNTER — Inpatient Hospital Stay: Payer: Commercial Managed Care - PPO | Admitting: Family Medicine

## 2018-02-15 LAB — VITAMIN B1: Vitamin B1 (Thiamine): 161 nmol/L — ABNORMAL HIGH (ref 8–30)

## 2018-02-20 DIAGNOSIS — M79602 Pain in left arm: Secondary | ICD-10-CM | POA: Diagnosis not present

## 2018-02-20 DIAGNOSIS — R29898 Other symptoms and signs involving the musculoskeletal system: Secondary | ICD-10-CM | POA: Insufficient documentation

## 2018-02-20 DIAGNOSIS — E519 Thiamine deficiency, unspecified: Secondary | ICD-10-CM | POA: Diagnosis not present

## 2018-03-18 DIAGNOSIS — R29898 Other symptoms and signs involving the musculoskeletal system: Secondary | ICD-10-CM | POA: Diagnosis not present

## 2018-03-18 DIAGNOSIS — M79602 Pain in left arm: Secondary | ICD-10-CM | POA: Diagnosis not present

## 2018-03-19 DIAGNOSIS — L501 Idiopathic urticaria: Secondary | ICD-10-CM | POA: Diagnosis not present

## 2018-04-10 DIAGNOSIS — E519 Thiamine deficiency, unspecified: Secondary | ICD-10-CM | POA: Diagnosis not present

## 2018-04-10 DIAGNOSIS — R29898 Other symptoms and signs involving the musculoskeletal system: Secondary | ICD-10-CM | POA: Diagnosis not present

## 2018-04-12 DIAGNOSIS — Z79899 Other long term (current) drug therapy: Secondary | ICD-10-CM | POA: Diagnosis not present

## 2018-04-12 DIAGNOSIS — E559 Vitamin D deficiency, unspecified: Secondary | ICD-10-CM | POA: Diagnosis not present

## 2018-04-12 DIAGNOSIS — E519 Thiamine deficiency, unspecified: Secondary | ICD-10-CM | POA: Diagnosis not present

## 2018-04-12 DIAGNOSIS — E611 Iron deficiency: Secondary | ICD-10-CM | POA: Diagnosis not present

## 2018-04-12 DIAGNOSIS — E538 Deficiency of other specified B group vitamins: Secondary | ICD-10-CM | POA: Diagnosis not present

## 2018-05-17 ENCOUNTER — Other Ambulatory Visit: Payer: Self-pay

## 2018-05-17 ENCOUNTER — Ambulatory Visit (INDEPENDENT_AMBULATORY_CARE_PROVIDER_SITE_OTHER): Payer: Commercial Managed Care - PPO | Admitting: Family Medicine

## 2018-05-17 ENCOUNTER — Encounter: Payer: Self-pay | Admitting: Family Medicine

## 2018-05-17 DIAGNOSIS — E519 Thiamine deficiency, unspecified: Secondary | ICD-10-CM

## 2018-05-17 DIAGNOSIS — E6609 Other obesity due to excess calories: Secondary | ICD-10-CM

## 2018-05-17 DIAGNOSIS — J309 Allergic rhinitis, unspecified: Secondary | ICD-10-CM

## 2018-05-17 DIAGNOSIS — D508 Other iron deficiency anemias: Secondary | ICD-10-CM

## 2018-05-17 DIAGNOSIS — L509 Urticaria, unspecified: Secondary | ICD-10-CM | POA: Diagnosis not present

## 2018-05-17 DIAGNOSIS — Z6834 Body mass index (BMI) 34.0-34.9, adult: Secondary | ICD-10-CM

## 2018-05-17 DIAGNOSIS — M25522 Pain in left elbow: Secondary | ICD-10-CM | POA: Diagnosis not present

## 2018-05-17 NOTE — Progress Notes (Signed)
Virtual Visit via video note  This visit type was conducted due to national recommendations for restrictions regarding the COVID-19 pandemic (e.g. social distancing).  This format is felt to be most appropriate for this patient at this time.  All issues noted in this document were discussed and addressed.  No physical exam was performed (except for noted visual exam findings with Video Visits).   I connected with Whitney Hill today at  2:15 PM EDT by a video enabled telemedicine application and verified that I am speaking with the correct person using two identifiers. Location patient: home Location provider: work  Persons participating in the virtual visit: patient, provider  I discussed the limitations, risks, security and privacy concerns of performing an evaluation and management service by telephone and the availability of in person appointments. I also discussed with the patient that there may be a patient responsible charge related to this service. The patient expressed understanding and agreed to proceed.   Reason for visit: Follow-up  HPI: Thiamine deficiency: Patient notes her neurological symptoms related to this have improved significantly.  She notes she feels back to normal again with the exception of her left arm hurting.  She notes no numbness or tingling.  She continues on oral thiamine supplement.  She does note her left arm at the elbow feels weaker than the right arm and she has pain if she extends the left arm.  Chronic hives: Patient is back on Xolair.  She notes this has been beneficial.  Weight gain: Patient notes she has gained 30 pounds recently.  She feels hungry all the time.  She started watching her diet and walking for exercise 2 weeks ago.  She is eating lots of salads.  Smaller portions.  She stopped junk food.  She weighs 230 pounds now.  Allergic rhinitis: Patient notes for several weeks now she has had some sneezing and mild irritation in her throat  with some mild congestion and rhinorrhea.  She does note some itchy watery eyes.  She notes the symptoms will be there 1 day and the next day they are better.  She notes no fevers.  She has no postnasal drip.  Her son was ill in early March though she had no contact with him and he lives in [place].  He was not tested.  Her daughter did have contact with the patient's son though when the patient's daughter returned from [place] she was quarantined at home and never developed any symptoms.  This was over 1 month ago.  She does have a history of allergies.  She is not taking any medication for this.  Iron deficiency: Recent ferritin was low.  She notes no rectal bleeding.  She does have menstrual cycles that are intermittent and oftentimes occur each month though at times she will skip a month.  She notes they are heavy with blood clots.  They last for 7 days.  She underwent EGD in 2017.  She also underwent a colonoscopy related to chronic diarrhea in 2011 that was negative.  She notes no melena.  No abdominal pain.  She was previously followed by hematology for this.  Previously felt to be related to inadequate absorption related to gastric bypass.   ROS: See pertinent positives and negatives per HPI.  Past Medical History:  Diagnosis Date  . Arthritis    OA  . Complication of anesthesia    "ITCHING TO FACE, BENADRYL HELP BUT DROPS BLOOD PRESSURE"  . DVT (deep venous thrombosis) ([place])  following first knee surgery in 2011  . Gastric ulcer   . Headache   . Heterozygous factor V Leiden mutation ([place])   . Hypothyroid   . Iron deficiency anemia     Past Surgical History:  Procedure Laterality Date  . [place]  . CHOLECYSTECTOMY     1998  . [place], 2001  . GASTRIC BYPASS     2012, revision 2013  . KNEE ARTHROSCOPY     2011, 2013, 2015  . TOOTH EXTRACTION     2010  . TOTAL KNEE ARTHROPLASTY Left 05/25/2015    Procedure: LEFT TOTAL KNEE ARTHROPLASTY;  Surgeon: Renette Butters, MD;  Location: [facility];  Service: Orthopedics;  Laterality: Left;  . TUBAL LIGATION     2004    Family History  Problem Relation Age of Onset  . Arthritis Other        parent  . Hyperlipidemia Other        parent  . Stroke Other        parent  . Hypertension Other        parent  . Kidney disease Other        parent, grandparent  . Diabetes Other        parent, grandparent  . Renal cancer Other        parent  . Breast cancer Maternal Aunt 88       BRACA + dx twice age 9 and 36  . Breast cancer Maternal Grandmother        late 23's    SOCIAL HX: Non-smoker.   Current Outpatient Medications:  .  Cholecalciferol 50000 units capsule, Take 1 capsule (50,000 Units total) by mouth once a week., Disp: 13 capsule, Rfl: 1 .  diclofenac sodium (VOLTAREN) 1 % GEL, Apply 2 g topically 4 (four) times daily., Disp: , Rfl:  .  EPINEPHrine 0.3 mg/0.3 mL IJ SOAJ injection, Inject into the muscle., Disp: , Rfl:  .  hydrOXYzine (ATARAX/VISTARIL) 25 MG tablet, Take 1 tablet (25 mg total) by mouth 3 (three) times daily as needed (itching)., Disp: 30 tablet, Rfl: 0 .  levocetirizine (XYZAL) 5 MG tablet, , Disp: , Rfl:  .  montelukast (SINGULAIR) 10 MG tablet, Take 10 mg by mouth at bedtime., Disp: , Rfl:  .  multivitamin-iron-minerals-folic acid (CENTRUM) chewable tablet, Chew 1 tablet by mouth daily., Disp: , Rfl:  .  NON FORMULARY, xolair injections once monthly to treat hives, Disp: , Rfl:  .  nystatin (NYSTATIN) powder, Apply topically 2 (two) times daily., Disp: 15 g, Rfl: 0 .  potassium chloride (K-DUR) 10 MEQ tablet, Take 1 tablet (10 mEq total) by mouth daily., Disp: 30 tablet, Rfl: 0 .  thiamine 100 MG tablet, Take 1 tablet (100 mg total) by mouth daily., Disp: , Rfl:  .  thyroid (NP THYROID) 120 MG tablet, Take 120 mg by mouth daily before breakfast., Disp: , Rfl:  .  triamcinolone cream (KENALOG) 0.1 %, Apply 1  application topically 2 (two) times daily., Disp: 30 g, Rfl: 0 .  Vitamin D, Ergocalciferol, (DRISDOL) 50000 units CAPS capsule, Take 1 capsule (50,000 Units total) by mouth every 7 (seven) days., Disp: 8 capsule, Rfl: 0 .  omalizumab (XOLAIR) 150 MG injection, Inject into the skin., Disp: , Rfl:   EXAM:  VITALS per patient if applicable: None.  GENERAL: alert, oriented, appears well and in no  acute distress  HEENT: atraumatic, conjunttiva clear, no obvious abnormalities on inspection of external nose and ears  NECK: normal movements of the head and neck  LUNGS: on inspection no signs of respiratory distress, breathing rate appears normal, no obvious gross SOB, gasping or wheezing  CV: no obvious cyanosis  MS: moves all visible extremities without noticeable abnormality  PSYCH/NEURO: pleasant and cooperative, no obvious depression or anxiety, speech and thought processing grossly intact  ASSESSMENT AND PLAN:  Discussed the following assessment and plan:  Hives  Other iron deficiency anemia - Plan: Ambulatory referral to Gastroenterology, Ambulatory referral to Hematology  Left elbow pain - Plan: Ambulatory referral to Orthopedic Surgery  Thiamine deficiency  Allergic rhinitis, unspecified seasonality, unspecified trigger  Class 1 obesity due to excess calories without serious comorbidity with body mass index (BMI) of 34.0 to 34.9 in adult  Hives Better controlled on Xolair.  She will continue to follow with her allergist.  Iron deficiency anemia Her ferritin has trended down.  We will refer back to hematology.  We will also refer to GI given that it has been several years since she had a GI evaluation for this.  Suspect this is related to inadequate absorption.  Left elbow pain This issue may be more of an orthopedic issue.  Does not seem to be related to her thiamine deficiency given lack of improvement.  Will refer to orthopedics.  Thiamine deficiency She will remain  on her supplement and continue to follow with neurology.  Allergic rhinitis Patient will start on Xyzal for this.  She will monitor for any worsening symptoms.  Obesity Encouraged continued dietary changes and exercise.  We will have her follow-up in 1 month and if her weight has not trended down we could consider placing her back on Saxenda.  She denies personal and family history of medullary thyroid cancer, adrenal gland cancer, and parathyroid gland cancer.  She tolerated this well previously.    Social distancing precautions and sick precautions given regarding COVID-19.  I discussed the assessment and treatment plan with the patient. The patient was provided an opportunity to ask questions and all were answered. The patient agreed with the plan and demonstrated an understanding of the instructions.   The patient was advised to call back or seek an in-person evaluation if the symptoms worsen or if the condition fails to improve as anticipated.   Tommi Rumps, MD

## 2018-05-18 ENCOUNTER — Telehealth: Payer: Self-pay | Admitting: Family Medicine

## 2018-05-18 NOTE — Telephone Encounter (Signed)
Please contact the patient and get her set up for follow-up in 1 month.

## 2018-05-18 NOTE — Assessment & Plan Note (Signed)
Patient will start on Xyzal for this.  She will monitor for any worsening symptoms.

## 2018-05-18 NOTE — Assessment & Plan Note (Signed)
Her ferritin has trended down.  We will refer back to hematology.  We will also refer to GI given that it has been several years since she had a GI evaluation for this.  Suspect this is related to inadequate absorption.

## 2018-05-18 NOTE — Assessment & Plan Note (Signed)
She will remain on her supplement and continue to follow with neurology.

## 2018-05-18 NOTE — Assessment & Plan Note (Signed)
This issue may be more of an orthopedic issue.  Does not seem to be related to her thiamine deficiency given lack of improvement.  Will refer to orthopedics.

## 2018-05-18 NOTE — Assessment & Plan Note (Signed)
Encouraged continued dietary changes and exercise.  We will have her follow-up in 1 month and if her weight has not trended down we could consider placing her back on Saxenda.  She denies personal and family history of medullary thyroid cancer, adrenal gland cancer, and parathyroid gland cancer.  She tolerated this well previously.

## 2018-05-18 NOTE — Assessment & Plan Note (Signed)
Better controlled on Xolair.  She will continue to follow with her allergist.

## 2018-05-20 NOTE — Telephone Encounter (Signed)
Pt scheduled for appt.

## 2018-05-28 ENCOUNTER — Encounter: Payer: Self-pay | Admitting: *Deleted

## 2018-05-30 ENCOUNTER — Telehealth: Payer: Self-pay | Admitting: Family Medicine

## 2018-05-30 NOTE — Telephone Encounter (Signed)
Called and spoke with pt. Pt advised and will call GI.

## 2018-05-30 NOTE — Telephone Encounter (Signed)
Please contact the patient and let her know that GI has been trying to contact her to schedule an appointment. She should call them at [phone] to set this up. Thanks.

## 2018-06-04 ENCOUNTER — Other Ambulatory Visit: Payer: Self-pay

## 2018-06-04 ENCOUNTER — Ambulatory Visit (INDEPENDENT_AMBULATORY_CARE_PROVIDER_SITE_OTHER): Payer: Commercial Managed Care - PPO | Admitting: Gastroenterology

## 2018-06-04 ENCOUNTER — Encounter: Payer: Self-pay | Admitting: Gastroenterology

## 2018-06-04 VITALS — BP 109/70 | HR 80 | Temp 97.7°F | Ht 65.0 in | Wt 229.2 lb

## 2018-06-04 DIAGNOSIS — D509 Iron deficiency anemia, unspecified: Secondary | ICD-10-CM | POA: Diagnosis not present

## 2018-06-04 DIAGNOSIS — N921 Excessive and frequent menstruation with irregular cycle: Secondary | ICD-10-CM | POA: Insufficient documentation

## 2018-06-04 DIAGNOSIS — D508 Other iron deficiency anemias: Secondary | ICD-10-CM

## 2018-06-04 MED ORDER — NA SULFATE-K SULFATE-MG SULF 17.5-3.13-1.6 GM/177ML PO SOLN: 1.0000 | ORAL | 0 refills | Status: DC

## 2018-06-04 NOTE — H&P (View-Only) (Signed)
Gastroenterology Consultation  Referring Provider:     Leone Haven, MD Primary Care Physician:  Leone Haven, MD Primary Gastroenterologist:  Dr. Allen Norris     Reason for Consultation:     Iron deficiency        HPI:   Whitney Hill is a 48 y.o. y/o female referred for consultation & management of iron deficiency by Dr. Caryl Bis, Whitney Adam, MD.  This patient comes in today with a history of gastric bypass surgery done in [place] with resulting vitamin deficiencies in the past.  The patient was found to have a decreasing ferritin and the concern was for the patient to have iron deficiency.  She also reports that she has gained weight recently and reports that she has gained and lost weight when put on different medications.  The patient says that she suffers from hives and was put on medication including steroids that made her gain the weight.  She also reports that at a previous time she had multiple upper endoscopies with ulcers at the anastomosis from her gastric bypass which required her to get a revision.  She denies any black stools or bloody stools at the present time.  She had also seen hematology in the past and was getting iron infusions years ago but has not followed up with them in some time.  The patient was offered a duodenal switch in the past and she states that it was approved by her insurance company but she never went through with it.  She denies any abdominal pain that was similar to her anastomotic ulcers in the past.  Past Medical History:  Diagnosis Date   Arthritis    OA   Complication of anesthesia    "ITCHING TO FACE, BENADRYL HELP BUT DROPS BLOOD PRESSURE"   DVT (deep venous thrombosis) ([facility])    following first knee surgery in 2011   Gastric ulcer    Headache    Heterozygous factor V Leiden mutation ([place])    Hypothyroid    Iron deficiency anemia     Past Surgical History:  Procedure Laterality Date   ANKLE FRACTURE SURGERY     1994,  [place], 2001   GASTRIC BYPASS     2012, revision 2013   KNEE ARTHROSCOPY     2011, 2013, 2015   TOOTH EXTRACTION     2010   TOTAL KNEE ARTHROPLASTY Left 05/25/2015   Procedure: LEFT TOTAL KNEE ARTHROPLASTY;  Surgeon: Renette Butters, MD;  Location: [facility];  Service: Orthopedics;  Laterality: Left;   TUBAL LIGATION     2004    Prior to Admission medications   Medication Sig Start Date End Date Taking? Authorizing Provider  Cholecalciferol 50000 units capsule Take 1 capsule (50,000 Units total) by mouth once a week. 09/24/17  Yes McLean-Scocuzza, Nino Glow, MD  diclofenac sodium (VOLTAREN) 1 % GEL Apply 2 g topically 4 (four) times daily.   Yes [provider]  EPINEPHrine 0.3 mg/0.3 mL IJ SOAJ injection Inject into the muscle. 10/04/16  Yes [provider]  hydrOXYzine (ATARAX/VISTARIL) 25 MG tablet Take 1 tablet (25 mg total) by mouth 3 (three) times daily as needed (itching). 01/23/18  Yes Demetrios Loll, MD  multivitamin-iron-minerals-folic acid (CENTRUM) chewable tablet Chew 1 tablet by mouth daily.   Yes [provider]  NON FORMULARY xolair injections once monthly to treat hives  Yes [provider]  nystatin (NYSTATIN) powder Apply topically 2 (two) times daily. 07/18/16  Yes Leone Haven, MD  omalizumab Arvid Right) 150 MG injection Inject into the skin.   Yes [provider]  potassium chloride (K-DUR) 10 MEQ tablet Take 1 tablet (10 mEq total) by mouth daily. 09/24/17  Yes McLean-Scocuzza, Nino Glow, MD  thiamine 100 MG tablet Take 1 tablet (100 mg total) by mouth daily. 01/23/18  Yes Demetrios Loll, MD  thyroid (NP THYROID) 120 MG tablet Take 120 mg by mouth daily before breakfast.   Yes [provider]  Vitamin D, Ergocalciferol, (DRISDOL) 50000 units CAPS capsule Take 1 capsule (50,000 Units total) by mouth every 7 (seven) days. 02/14/16  Yes Leone Haven, MD   famotidine (PEPCID) 40 MG tablet Take 40 mg by mouth 2 (two) times daily. 05/06/18   [provider]  levocetirizine (XYZAL) 5 MG tablet  01/30/18   [provider]  montelukast (SINGULAIR) 10 MG tablet Take 10 mg by mouth at bedtime.    [provider]  phentermine (ADIPEX-P) 37.5 MG tablet Take 37.5 mg by mouth daily. 02/19/18   [provider]  triamcinolone cream (KENALOG) 0.1 % Apply 1 application topically 2 (two) times daily. Patient not taking: Reported on 06/04/2018 08/30/16   Leone Haven, MD    Family History  Problem Relation Age of Onset   Arthritis Other        parent   Hyperlipidemia Other        parent   Stroke Other        parent   Hypertension Other        parent   Kidney disease Other        parent, grandparent   Diabetes Other        parent, grandparent   Renal cancer Other        parent   Breast cancer Maternal Aunt 59       BRACA + dx twice age 44 and 37   Breast cancer Maternal Grandmother        late 37's     Social History   Tobacco Use   Smoking status: Never Smoker   Smokeless tobacco: Never Used  Substance Use Topics   Alcohol use: No    Alcohol/week: 0.0 standard drinks   Drug use: No    Allergies as of 06/04/2018 - Review Complete 06/04/2018  Allergen Reaction Noted   Paxil [paroxetine hcl] Anaphylaxis 10/01/2014   Penicillins Hives 10/01/2014    Review of Systems:    All systems reviewed and negative except where noted in HPI.   Physical Exam:  BP 109/70    Pulse 80    Temp 97.7 F (36.5 C) (Oral)    Ht 5\' 5"  (1.651 m)    Wt 229 lb 3.2 oz (104 kg)    BMI 38.14 kg/m  No LMP recorded. (Menstrual status: Irregular Periods). General:   Alert,  Well-developed, well-nourished, pleasant and cooperative in NAD Head:  Normocephalic and atraumatic. Eyes:  Sclera clear, no icterus.   Conjunctiva pink. Ears:  Normal auditory acuity. Nose:  No deformity, discharge, or lesions. Mouth:   No deformity or lesions,oropharynx pink & moist. Neck:  Supple; no masses or thyromegaly. Lungs:  Respirations even and unlabored.  Clear throughout to auscultation.   No wheezes, crackles, or rhonchi. No acute distress. Heart:  Regular rate and rhythm; no murmurs, clicks, rubs, or gallops. Abdomen:  Normal bowel sounds.  No bruits.  Soft, non-tender and non-distended without masses, hepatosplenomegaly or hernias noted.  No guarding or rebound tenderness.  Negative Carnett sign.   Rectal:  Deferred.  Msk:  Symmetrical without gross deformities.  Good, equal movement & strength bilaterally. Pulses:  Normal pulses noted. Extremities:  No clubbing or edema.  No cyanosis. Neurologic:  Alert and oriented x3;  grossly normal neurologically. Skin:  Intact without significant lesions or rashes.  No jaundice. Lymph Nodes:  No significant cervical adenopathy. Psych:  Alert and cooperative. Normal mood and affect.  Imaging Studies: No results found.  Assessment and Plan:   Azelyn Batie is a 48 y.o. y/o female who has a history of gastric bypass surgery with a resulting B1 deficiency.  The patient has also had a history of anemia with need for iron infusions.  The patient has not had that recently nor has she seen him in time.  She is now being sent to me because of her decreasing ferritin.  The patient has not had a colonoscopy in the recent past.  The patient will be set up for an EGD and colonoscopy. I have discussed risks & benefits which include, but are not limited to, bleeding, infection, perforation & drug reaction.  The patient agrees with this plan & written consent will be obtained.     Lucilla Lame, MD. [facility]al    Note: This dictation was prepared with Dragon dictation along with smaller phrase technology. Any transcriptional errors that result from this process are unintentional.

## 2018-06-04 NOTE — Progress Notes (Signed)
Gastroenterology Consultation  Referring Provider:     Leone Haven, MD Primary Care Physician:  Leone Haven, MD Primary Gastroenterologist:  Dr. Allen Norris     Reason for Consultation:     Iron deficiency        HPI:   Whitney Hill is a 48 y.o. y/o female referred for consultation & management of iron deficiency by Dr. Caryl Bis, Angela Adam, MD.  This patient comes in today with a history of gastric bypass surgery done in [place] with resulting vitamin deficiencies in the past.  The patient was found to have a decreasing ferritin and the concern was for the patient to have iron deficiency.  She also reports that she has gained weight recently and reports that she has gained and lost weight when put on different medications.  The patient says that she suffers from hives and was put on medication including steroids that made her gain the weight.  She also reports that at a previous time she had multiple upper endoscopies with ulcers at the anastomosis from her gastric bypass which required her to get a revision.  She denies any black stools or bloody stools at the present time.  She had also seen hematology in the past and was getting iron infusions years ago but has not followed up with them in some time.  The patient was offered a duodenal switch in the past and she states that it was approved by her insurance company but she never went through with it.  She denies any abdominal pain that was similar to her anastomotic ulcers in the past.  Past Medical History:  Diagnosis Date   Arthritis    OA   Complication of anesthesia    "ITCHING TO FACE, BENADRYL HELP BUT DROPS BLOOD PRESSURE"   DVT (deep venous thrombosis) (HCC)    following first knee surgery in 2011   Gastric ulcer    Headache    Heterozygous factor V Leiden mutation ([place])    Hypothyroid    Iron deficiency anemia     Past Surgical History:  Procedure Laterality Date   ANKLE FRACTURE SURGERY     1994,  [place], 2001   GASTRIC BYPASS     2012, revision 2013   KNEE ARTHROSCOPY     2011, 2013, 2015   TOOTH EXTRACTION     2010   TOTAL KNEE ARTHROPLASTY Left 05/25/2015   Procedure: LEFT TOTAL KNEE ARTHROPLASTY;  Surgeon: Renette Butters, MD;  Location: [facility];  Service: Orthopedics;  Laterality: Left;   TUBAL LIGATION     2004    Prior to Admission medications   Medication Sig Start Date End Date Taking? Authorizing Provider  Cholecalciferol 50000 units capsule Take 1 capsule (50,000 Units total) by mouth once a week. 09/24/17  Yes McLean-Scocuzza, Nino Glow, MD  diclofenac sodium (VOLTAREN) 1 % GEL Apply 2 g topically 4 (four) times daily.   Yes [provider]  EPINEPHrine 0.3 mg/0.3 mL IJ SOAJ injection Inject into the muscle. 10/04/16  Yes [provider]  hydrOXYzine (ATARAX/VISTARIL) 25 MG tablet Take 1 tablet (25 mg total) by mouth 3 (three) times daily as needed (itching). 01/23/18  Yes Demetrios Loll, MD  multivitamin-iron-minerals-folic acid (CENTRUM) chewable tablet Chew 1 tablet by mouth daily.   Yes [provider]  NON FORMULARY xolair injections once monthly to treat hives  Yes [provider]  nystatin (NYSTATIN) powder Apply topically 2 (two) times daily. 07/18/16  Yes Leone Haven, MD  omalizumab Arvid Right) 150 MG injection Inject into the skin.   Yes [provider]  potassium chloride (K-DUR) 10 MEQ tablet Take 1 tablet (10 mEq total) by mouth daily. 09/24/17  Yes McLean-Scocuzza, Nino Glow, MD  thiamine 100 MG tablet Take 1 tablet (100 mg total) by mouth daily. 01/23/18  Yes Demetrios Loll, MD  thyroid (NP THYROID) 120 MG tablet Take 120 mg by mouth daily before breakfast.   Yes [provider]  Vitamin D, Ergocalciferol, (DRISDOL) 50000 units CAPS capsule Take 1 capsule (50,000 Units total) by mouth every 7 (seven) days. 02/14/16  Yes Leone Haven, MD   famotidine (PEPCID) 40 MG tablet Take 40 mg by mouth 2 (two) times daily. 05/06/18   [provider]  levocetirizine (XYZAL) 5 MG tablet  01/30/18   [provider]  montelukast (SINGULAIR) 10 MG tablet Take 10 mg by mouth at bedtime.    [provider]  phentermine (ADIPEX-P) 37.5 MG tablet Take 37.5 mg by mouth daily. 02/19/18   [provider]  triamcinolone cream (KENALOG) 0.1 % Apply 1 application topically 2 (two) times daily. Patient not taking: Reported on 06/04/2018 08/30/16   Leone Haven, MD    Family History  Problem Relation Age of Onset   Arthritis Other        parent   Hyperlipidemia Other        parent   Stroke Other        parent   Hypertension Other        parent   Kidney disease Other        parent, grandparent   Diabetes Other        parent, grandparent   Renal cancer Other        parent   Breast cancer Maternal Aunt 57       BRACA + dx twice age 49 and 41   Breast cancer Maternal Grandmother        late 64's     Social History   Tobacco Use   Smoking status: Never Smoker   Smokeless tobacco: Never Used  Substance Use Topics   Alcohol use: No    Alcohol/week: 0.0 standard drinks   Drug use: No    Allergies as of 06/04/2018 - Review Complete 06/04/2018  Allergen Reaction Noted   Paxil [paroxetine hcl] Anaphylaxis 10/01/2014   Penicillins Hives 10/01/2014    Review of Systems:    All systems reviewed and negative except where noted in HPI.   Physical Exam:  BP 109/70    Pulse 80    Temp 97.7 F (36.5 C) (Oral)    Ht 5\' 5"  (1.651 m)    Wt 229 lb 3.2 oz (104 kg)    BMI 38.14 kg/m  No LMP recorded. (Menstrual status: Irregular Periods). General:   Alert,  Well-developed, well-nourished, pleasant and cooperative in NAD Head:  Normocephalic and atraumatic. Eyes:  Sclera clear, no icterus.   Conjunctiva pink. Ears:  Normal auditory acuity. Nose:  No deformity, discharge, or lesions. Mouth:   No deformity or lesions,oropharynx pink & moist. Neck:  Supple; no masses or thyromegaly. Lungs:  Respirations even and unlabored.  Clear throughout to auscultation.   No wheezes, crackles, or rhonchi. No acute distress. Heart:  Regular rate and rhythm; no murmurs, clicks, rubs, or gallops. Abdomen:  Normal bowel sounds.  No bruits.  Soft, non-tender and non-distended without masses, hepatosplenomegaly or hernias noted.  No guarding or rebound tenderness.  Negative Carnett sign.   Rectal:  Deferred.  Msk:  Symmetrical without gross deformities.  Good, equal movement & strength bilaterally. Pulses:  Normal pulses noted. Extremities:  No clubbing or edema.  No cyanosis. Neurologic:  Alert and oriented x3;  grossly normal neurologically. Skin:  Intact without significant lesions or rashes.  No jaundice. Lymph Nodes:  No significant cervical adenopathy. Psych:  Alert and cooperative. Normal mood and affect.  Imaging Studies: No results found.  Assessment and Plan:   Kerin Kren is a 48 y.o. y/o female who has a history of gastric bypass surgery with a resulting B1 deficiency.  The patient has also had a history of anemia with need for iron infusions.  The patient has not had that recently nor has she seen him in time.  She is now being sent to me because of her decreasing ferritin.  The patient has not had a colonoscopy in the recent past.  The patient will be set up for an EGD and colonoscopy. I have discussed risks & benefits which include, but are not limited to, bleeding, infection, perforation & drug reaction.  The patient agrees with this plan & written consent will be obtained.     Lucilla Lame, MD. Marval Regal    Note: This dictation was prepared with Dragon dictation along with smaller phrase technology. Any transcriptional errors that result from this process are unintentional.

## 2018-06-10 ENCOUNTER — Encounter: Payer: Self-pay | Admitting: *Deleted

## 2018-06-10 ENCOUNTER — Encounter
Admission: RE | Admit: 2018-06-10 | Discharge: 2018-06-10 | Disposition: A | Discharge status: Home / Self Care | Payer: Commercial Managed Care - PPO | Source: Ambulatory Visit | Attending: Gastroenterology | Admitting: Gastroenterology

## 2018-06-10 ENCOUNTER — Other Ambulatory Visit: Payer: Self-pay

## 2018-06-10 DIAGNOSIS — Z1159 Encounter for screening for other viral diseases: Secondary | ICD-10-CM | POA: Diagnosis not present

## 2018-06-10 DIAGNOSIS — Z01812 Encounter for preprocedural laboratory examination: Secondary | ICD-10-CM | POA: Diagnosis present

## 2018-06-11 LAB — NOVEL CORONAVIRUS, NAA (HOSP ORDER, SEND-OUT TO REF LAB; TAT 18-24 HRS): SARS-CoV-2, NAA: NOT DETECTED

## 2018-06-13 NOTE — Discharge Instructions (Signed)
General Anesthesia, Adult, Care After  This sheet gives you information about how to care for yourself after your procedure. Your health care provider may also give you more specific instructions. If you have problems or questions, contact your health care provider.  What can I expect after the procedure?  After the procedure, the following side effects are common:  Pain or discomfort at the IV site.  Nausea.  Vomiting.  Sore throat.  Trouble concentrating.  Feeling cold or chills.  Weak or tired.  Sleepiness and fatigue.  Soreness and body aches. These side effects can affect parts of the body that were not involved in surgery.  Follow these instructions at home:    For at least 24 hours after the procedure:  Have a responsible adult stay with you. It is important to have someone help care for you until you are awake and alert.  Rest as needed.  Do not:  Participate in activities in which you could fall or become injured.  Drive.  Use heavy machinery.  Drink alcohol.  Take sleeping pills or medicines that cause drowsiness.  Make important decisions or sign legal documents.  Take care of children on your own.  Eating and drinking  Follow any instructions from your health care provider about eating or drinking restrictions.  When you feel hungry, start by eating small amounts of foods that are soft and easy to digest (bland), such as toast. Gradually return to your regular diet.  Drink enough fluid to keep your urine pale yellow.  If you vomit, rehydrate by drinking water, juice, or clear broth.  General instructions  If you have sleep apnea, surgery and certain medicines can increase your risk for breathing problems. Follow instructions from your health care provider about wearing your sleep device:  Anytime you are sleeping, including during daytime naps.  While taking prescription pain medicines, sleeping medicines, or medicines that make you drowsy.  Return to your normal activities as told by your health care  provider. Ask your health care provider what activities are safe for you.  Take over-the-counter and prescription medicines only as told by your health care provider.  If you smoke, do not smoke without supervision.  Keep all follow-up visits as told by your health care provider. This is important.  Contact a health care provider if:  You have nausea or vomiting that does not get better with medicine.  You cannot eat or drink without vomiting.  You have pain that does not get better with medicine.  You are unable to pass urine.  You develop a skin rash.  You have a fever.  You have redness around your IV site that gets worse.  Get help right away if:  You have difficulty breathing.  You have chest pain.  You have blood in your urine or stool, or you vomit blood.  Summary  After the procedure, it is common to have a sore throat or nausea. It is also common to feel tired.  Have a responsible adult stay with you for the first 24 hours after general anesthesia. It is important to have someone help care for you until you are awake and alert.  When you feel hungry, start by eating small amounts of foods that are soft and easy to digest (bland), such as toast. Gradually return to your regular diet.  Drink enough fluid to keep your urine pale yellow.  Return to your normal activities as told by your health care provider. Ask your health care   provider what activities are safe for you.  This information is not intended to replace advice given to you by your health care provider. Make sure you discuss any questions you have with your health care provider.  Document Released: 04/03/2000 Document Revised: 08/11/2016 Document Reviewed: 08/11/2016  Elsevier Interactive Patient Education  2019 Elsevier Inc.

## 2018-06-14 ENCOUNTER — Ambulatory Visit
Admission: RE | Admit: 2018-06-14 | Discharge: 2018-06-14 | Disposition: A | Discharge status: Home / Self Care | Payer: Commercial Managed Care - PPO | Attending: Gastroenterology | Admitting: Gastroenterology

## 2018-06-14 ENCOUNTER — Ambulatory Visit: Payer: Commercial Managed Care - PPO | Admitting: Anesthesiology

## 2018-06-14 ENCOUNTER — Encounter
Admission: RE | Disposition: A | Discharge status: Home / Self Care | Payer: Self-pay | Source: Home / Self Care | Attending: Gastroenterology

## 2018-06-14 DIAGNOSIS — K641 Second degree hemorrhoids: Secondary | ICD-10-CM | POA: Insufficient documentation

## 2018-06-14 DIAGNOSIS — E039 Hypothyroidism, unspecified: Secondary | ICD-10-CM | POA: Insufficient documentation

## 2018-06-14 DIAGNOSIS — Z8051 Family history of malignant neoplasm of kidney: Secondary | ICD-10-CM | POA: Diagnosis not present

## 2018-06-14 DIAGNOSIS — Z803 Family history of malignant neoplasm of breast: Secondary | ICD-10-CM | POA: Diagnosis not present

## 2018-06-14 DIAGNOSIS — E519 Thiamine deficiency, unspecified: Secondary | ICD-10-CM | POA: Diagnosis not present

## 2018-06-14 DIAGNOSIS — D509 Iron deficiency anemia, unspecified: Secondary | ICD-10-CM | POA: Insufficient documentation

## 2018-06-14 DIAGNOSIS — Z8249 Family history of ischemic heart disease and other diseases of the circulatory system: Secondary | ICD-10-CM | POA: Diagnosis not present

## 2018-06-14 DIAGNOSIS — Z96652 Presence of left artificial knee joint: Secondary | ICD-10-CM | POA: Insufficient documentation

## 2018-06-14 DIAGNOSIS — Z1211 Encounter for screening for malignant neoplasm of colon: Secondary | ICD-10-CM

## 2018-06-14 DIAGNOSIS — M1990 Unspecified osteoarthritis, unspecified site: Secondary | ICD-10-CM | POA: Insufficient documentation

## 2018-06-14 DIAGNOSIS — Z9884 Bariatric surgery status: Secondary | ICD-10-CM | POA: Diagnosis not present

## 2018-06-14 DIAGNOSIS — D6851 Activated protein C resistance: Secondary | ICD-10-CM | POA: Insufficient documentation

## 2018-06-14 DIAGNOSIS — K259 Gastric ulcer, unspecified as acute or chronic, without hemorrhage or perforation: Secondary | ICD-10-CM | POA: Diagnosis not present

## 2018-06-14 DIAGNOSIS — Z9049 Acquired absence of other specified parts of digestive tract: Secondary | ICD-10-CM | POA: Insufficient documentation

## 2018-06-14 DIAGNOSIS — D122 Benign neoplasm of ascending colon: Secondary | ICD-10-CM | POA: Insufficient documentation

## 2018-06-14 DIAGNOSIS — D125 Benign neoplasm of sigmoid colon: Secondary | ICD-10-CM | POA: Insufficient documentation

## 2018-06-14 DIAGNOSIS — Z79899 Other long term (current) drug therapy: Secondary | ICD-10-CM | POA: Diagnosis not present

## 2018-06-14 DIAGNOSIS — D500 Iron deficiency anemia secondary to blood loss (chronic): Secondary | ICD-10-CM

## 2018-06-14 DIAGNOSIS — Z791 Long term (current) use of non-steroidal anti-inflammatories (NSAID): Secondary | ICD-10-CM | POA: Insufficient documentation

## 2018-06-14 DIAGNOSIS — Z833 Family history of diabetes mellitus: Secondary | ICD-10-CM | POA: Insufficient documentation

## 2018-06-14 DIAGNOSIS — Z86718 Personal history of other venous thrombosis and embolism: Secondary | ICD-10-CM | POA: Diagnosis not present

## 2018-06-14 DIAGNOSIS — Z841 Family history of disorders of kidney and ureter: Secondary | ICD-10-CM | POA: Insufficient documentation

## 2018-06-14 DIAGNOSIS — Z823 Family history of stroke: Secondary | ICD-10-CM | POA: Diagnosis not present

## 2018-06-14 DIAGNOSIS — Z8261 Family history of arthritis: Secondary | ICD-10-CM | POA: Diagnosis not present

## 2018-06-14 DIAGNOSIS — K635 Polyp of colon: Secondary | ICD-10-CM

## 2018-06-14 DIAGNOSIS — D508 Other iron deficiency anemias: Secondary | ICD-10-CM

## 2018-06-14 HISTORY — PX: POLYPECTOMY: SHX5525

## 2018-06-14 HISTORY — PX: FOREIGN BODY REMOVAL: SHX962

## 2018-06-14 HISTORY — PX: ESOPHAGOGASTRODUODENOSCOPY (EGD) WITH PROPOFOL: SHX5813

## 2018-06-14 HISTORY — PX: COLONOSCOPY WITH PROPOFOL: SHX5780

## 2018-06-14 SURGERY — COLONOSCOPY WITH PROPOFOL
Anesthesia: General | Site: Rectum

## 2018-06-14 MED ORDER — GLYCOPYRROLATE 0.2 MG/ML IJ SOLN
INTRAMUSCULAR | Status: DC | PRN
  Administered 2018-06-14: 0.1 mg via INTRAVENOUS

## 2018-06-14 MED ORDER — PROPOFOL 10 MG/ML IV BOLUS
INTRAVENOUS | Status: DC | PRN
  Administered 2018-06-14: 120 mg via INTRAVENOUS
  Administered 2018-06-14: 30 mg via INTRAVENOUS
  Administered 2018-06-14 (×2): 50 mg via INTRAVENOUS
  Administered 2018-06-14: 30 mg via INTRAVENOUS
  Administered 2018-06-14: 40 mg via INTRAVENOUS
  Administered 2018-06-14 (×2): 30 mg via INTRAVENOUS

## 2018-06-14 MED ORDER — LACTATED RINGERS IV SOLN
10.0000 mL/h | INTRAVENOUS | Status: DC
  Administered 2018-06-14: 09:00:00 10 mL/h via INTRAVENOUS

## 2018-06-14 MED ORDER — STERILE WATER FOR IRRIGATION IR SOLN
Status: DC | PRN
  Administered 2018-06-14: 10:00:00

## 2018-06-14 MED ORDER — ONDANSETRON HCL 4 MG/2ML IJ SOLN: 4.0000 mg | Freq: Once | INTRAMUSCULAR | Status: DC | PRN

## 2018-06-14 MED ORDER — SODIUM CHLORIDE 0.9 % IV SOLN: INTRAVENOUS | Status: DC

## 2018-06-14 MED ORDER — LIDOCAINE HCL (CARDIAC) PF 100 MG/5ML IV SOSY
PREFILLED_SYRINGE | INTRAVENOUS | Status: DC | PRN
  Administered 2018-06-14: 30 mg via INTRAVENOUS

## 2018-06-14 SURGICAL SUPPLY — 9 items
BLOCK BITE 60FR ADLT L/F GRN (MISCELLANEOUS) ×5 IMPLANT
CANISTER SUCT 1200ML W/VALVE (MISCELLANEOUS) ×5 IMPLANT
FORCEPS BIOP RAD 4 LRG CAP 4 (CUTTING FORCEPS) ×2 IMPLANT
GOWN CVR UNV OPN BCK APRN NK (MISCELLANEOUS) ×6 IMPLANT
GOWN ISOL THUMB LOOP REG UNIV (MISCELLANEOUS) ×4
KIT ENDO PROCEDURE OLY (KITS) ×5 IMPLANT
SNARE SHORT THROW 13M SML OVAL (MISCELLANEOUS) ×2 IMPLANT
TRAP ETRAP POLY (MISCELLANEOUS) ×2 IMPLANT
WATER STERILE IRR 250ML POUR (IV SOLUTION) ×5 IMPLANT

## 2018-06-14 NOTE — Op Note (Signed)
[facility] Gastroenterology Patient Name: Whitney Hill Procedure Date: 06/14/2018 9:14 AM MRN: [id number] Account #: [id number] Date of Birth: October 27, 1970 Admit Type: Outpatient Age: 47 Room: [facility] OR ROOM 01 Gender: Female Note Status: Finalized Procedure:            Colonoscopy Indications:          Screening for colorectal malignant neoplasm Providers:            Lucilla Lame MD, MD Medicines:            Propofol per Anesthesia Complications:        No immediate complications. Procedure:            Pre-Anesthesia Assessment:                       - Prior to the procedure, a History and Physical was                        performed, and patient medications and allergies were                        reviewed. The patient's tolerance of previous                        anesthesia was also reviewed. The risks and benefits of                        the procedure and the sedation options and risks were                        discussed with the patient. All questions were                        answered, and informed consent was obtained. Prior                        Anticoagulants: The patient has taken no previous                        anticoagulant or antiplatelet agents. ASA Grade                        Assessment: II - A patient with mild systemic disease.                        After reviewing the risks and benefits, the patient was                        deemed in satisfactory condition to undergo the                        procedure.                       After obtaining informed consent, the colonoscope was                        passed under direct vision. Throughout the procedure,                        the patient's blood pressure,  pulse, and oxygen                        saturations were monitored continuously. The was                        introduced through the anus and advanced to the the                        cecum, identified by appendiceal orifice  and ileocecal                        valve. The colonoscopy was performed without                        difficulty. The patient tolerated the procedure well.                        The quality of the bowel preparation was excellent. Findings:      The perianal and digital rectal examinations were normal.      Two sessile polyps were found in the ascending colon. The polyps were 3       to 4 mm in size. These polyps were removed with a cold snare. Resection       and retrieval were complete.      A 5 mm polyp was found in the sigmoid colon. The polyp was pedunculated.       The polyp was removed with a cold snare. Resection and retrieval were       complete.      Non-bleeding internal hemorrhoids were found during retroflexion. The       hemorrhoids were Grade II (internal hemorrhoids that prolapse but reduce       spontaneously). Impression:           - Two 3 to 4 mm polyps in the ascending colon, removed                        with a cold snare. Resected and retrieved.                       - One 5 mm polyp in the sigmoid colon, removed with a                        cold snare. Resected and retrieved.                       - Non-bleeding internal hemorrhoids. Recommendation:       - Discharge patient to home.                       - Resume previous diet.                       - Continue present medications.                       - Await pathology results.                       - Repeat colonoscopy in 5 years if polyp adenoma and 10  years if hyperplastic Procedure Code(s):    --- Professional ---                       [phone], Colonoscopy, flexible; with removal of tumor(s),                        polyp(s), or other lesion(s) by snare technique Diagnosis Code(s):    --- Professional ---                       Z12.11, Encounter for screening for malignant neoplasm                        of colon                       K63.5, Polyp of colon CPT copyright 2019  American Medical Association. All rights reserved. The codes documented in this report are preliminary and upon coder review may  be revised to meet current compliance requirements. Lucilla Lame MD, MD 06/14/2018 9:52:39 AM This report has been signed electronically. Number of Addenda: 0 Note Initiated On: 06/14/2018 9:14 AM Scope Withdrawal Time: 0 hours 7 minutes 40 seconds  Total Procedure Duration: 0 hours 11 minutes 11 seconds       [facility]

## 2018-06-14 NOTE — Anesthesia Postprocedure Evaluation (Signed)
Anesthesia Post Note  Patient: Whitney Hill  Procedure(s) Performed: COLONOSCOPY WITH BIOPSY (N/A Rectum) ESOPHAGOGASTRODUODENOSCOPY (EGD) WITH PROPOFOL (N/A Esophagus) FOREIGN BODY REMOVAL (N/A ) POLYPECTOMY (N/A Rectum)  Patient location during evaluation: PACU Anesthesia Type: General Level of consciousness: awake Pain management: pain level controlled Vital Signs Assessment: post-procedure vital signs reviewed and stable Respiratory status: respiratory function stable Cardiovascular status: stable Postop Assessment: no signs of nausea or vomiting Anesthetic complications: no    Veda Canning

## 2018-06-14 NOTE — Transfer of Care (Signed)
Immediate Anesthesia Transfer of Care Note  Patient: BRANDAN GLAUBER  Procedure(s) Performed: COLONOSCOPY WITH BIOPSY (N/A Rectum) ESOPHAGOGASTRODUODENOSCOPY (EGD) WITH PROPOFOL (N/A Esophagus) FOREIGN BODY REMOVAL (N/A ) POLYPECTOMY (N/A Rectum)  Patient Location: PACU  Anesthesia Type: General  Level of Consciousness: awake, alert  and patient cooperative  Airway and Oxygen Therapy: Patient Spontanous Breathing and Patient connected to supplemental oxygen  Post-op Assessment: Post-op Vital signs reviewed, Patient's Cardiovascular Status Stable, Respiratory Function Stable, Patent Airway and No signs of Nausea or vomiting  Post-op Vital Signs: Reviewed and stable  Complications: No apparent anesthesia complications

## 2018-06-14 NOTE — Op Note (Signed)
[facility] Gastroenterology Patient Name: Whitney Hill Procedure Date: 06/14/2018 9:14 AM MRN: [id number] Account #: [id number] Date of Birth: 02-22-1970 Admit Type: Outpatient Age: 48 Room: [facility] OR ROOM 01 Gender: Female Note Status: Finalized Procedure:            Upper GI endoscopy Indications:          Iron deficiency anemia Providers:            Lucilla Lame MD, MD Referring MD:         Angela Adam. Caryl Bis (Referring MD) Medicines:            Propofol per Anesthesia Complications:        No immediate complications. Procedure:            Pre-Anesthesia Assessment:                       - Prior to the procedure, a History and Physical was                        performed, and patient medications and allergies were                        reviewed. The patient's tolerance of previous                        anesthesia was also reviewed. The risks and benefits of                        the procedure and the sedation options and risks were                        discussed with the patient. All questions were                        answered, and informed consent was obtained. Prior                        Anticoagulants: The patient has taken no previous                        anticoagulant or antiplatelet agents. ASA Grade                        Assessment: II - A patient with mild systemic disease.                        After reviewing the risks and benefits, the patient was                        deemed in satisfactory condition to undergo the                        procedure.                       After obtaining informed consent, the endoscope was                        passed under direct vision. Throughout the procedure,  the patient's blood pressure, pulse, and oxygen                        saturations were monitored continuously. The was                        introduced through the mouth, and advanced to the   jejunum. The upper GI endoscopy was accomplished                        without difficulty. The patient tolerated the procedure                        well. Findings:      The examined esophagus was normal.      Evidence of a gastric bypass was found. A gastric pouch with a normal       size was found. The gastrojejunal anastomosis was characterized by an       intact staple line. This was traversed. Removal of a staple was       accomplished with a regular forceps.      The examined jejunum was normal.      One non-bleeding superficial gastric ulcer with no stigmata of bleeding       was found at the anastomosis. Impression:           - Normal esophagus.                       - Gastric bypass with a normal-sized pouch.                        Gastrojejunal anastomosis characterized by an intact                        staple line.                       - Normal examined jejunum.                       - Non-bleeding gastric ulcer with no stigmata of                        bleeding. Recommendation:       - Discharge patient to home.                       - Resume previous diet.                       - Continue present medications. Procedure Code(s):    --- Professional ---                       [phone], Esophagogastroduodenoscopy, flexible, transoral;                        with removal of foreign body(s) Diagnosis Code(s):    --- Professional ---                       D50.9, Iron deficiency anemia, unspecified                       K25.9,  Gastric ulcer, unspecified as acute or chronic,                        without hemorrhage or perforation CPT copyright 2019 American Medical Association. All rights reserved. The codes documented in this report are preliminary and upon coder review may  be revised to meet current compliance requirements. Lucilla Lame MD, MD 06/14/2018 9:38:53 AM This report has been signed electronically. Number of Addenda: 0 Note Initiated On: 06/14/2018 9:14 AM Total  Procedure Duration: 0 hours 5 minutes 58 seconds       [facility]

## 2018-06-14 NOTE — Interval H&P Note (Signed)
History and Physical Interval Note:  06/14/2018 8:47 AM  Whitney Hill  has presented today for surgery, with the diagnosis of Iron deficiency anemia D50.0.  The various methods of treatment have been discussed with the patient and family. After consideration of risks, benefits and other options for treatment, the patient has consented to  Procedure(s): COLONOSCOPY WITH PROPOFOL (N/A) ESOPHAGOGASTRODUODENOSCOPY (EGD) WITH PROPOFOL (N/A) as a surgical intervention.  The patient's history has been reviewed, patient examined, no change in status, stable for surgery.  I have reviewed the patient's chart and labs.  Questions were answered to the patient's satisfaction.      [employer]

## 2018-06-14 NOTE — Anesthesia Procedure Notes (Signed)
Date/Time: 06/14/2018 9:22 AM Performed by: Cameron Ali, CRNA Pre-anesthesia Checklist: Patient identified, Emergency Drugs available, Suction available, Timeout performed and Patient being monitored Patient Re-evaluated:Patient Re-evaluated prior to induction Oxygen Delivery Method: Nasal cannula Placement Confirmation: positive ETCO2

## 2018-06-14 NOTE — Anesthesia Preprocedure Evaluation (Signed)
Anesthesia Evaluation  Patient identified by MRN, date of birth, ID band Patient awake    Reviewed: Allergy & Precautions, NPO status , Patient's Chart, lab work & pertinent test results  Airway Mallampati: II  TM Distance: >3 FB     Dental   Pulmonary    breath sounds clear to auscultation       Cardiovascular negative cardio ROS   Rhythm:Regular Rate:Normal     Neuro/Psych  Headaches,    GI/Hepatic PUD,   Endo/Other  Hypothyroidism   Renal/GU      Musculoskeletal  (+) Arthritis ,   Abdominal   Peds  Hematology  (+) anemia ,   Anesthesia Other Findings   Reproductive/Obstetrics                             Anesthesia Physical Anesthesia Plan  ASA: II  Anesthesia Plan: General   Post-op Pain Management:    Induction: Intravenous  PONV Risk Score and Plan: TIVA  Airway Management Planned: Nasal Cannula and Natural Airway  Additional Equipment:   Intra-op Plan:   Post-operative Plan:   Informed Consent: I have reviewed the patients History and Physical, chart, labs and discussed the procedure including the risks, benefits and alternatives for the proposed anesthesia with the patient or authorized representative who has indicated his/her understanding and acceptance.     Dental advisory given  Plan Discussed with: CRNA  Anesthesia Plan Comments:         Anesthesia Quick Evaluation

## 2018-06-17 ENCOUNTER — Encounter: Payer: Self-pay | Admitting: Gastroenterology

## 2018-06-19 LAB — POCT PREGNANCY, URINE: Preg Test, Ur: NEGATIVE

## 2018-07-08 ENCOUNTER — Other Ambulatory Visit: Payer: Self-pay

## 2018-07-08 ENCOUNTER — Ambulatory Visit (INDEPENDENT_AMBULATORY_CARE_PROVIDER_SITE_OTHER): Payer: Commercial Managed Care - PPO | Admitting: Family Medicine

## 2018-07-08 DIAGNOSIS — M2550 Pain in unspecified joint: Secondary | ICD-10-CM

## 2018-07-08 DIAGNOSIS — Z8742 Personal history of other diseases of the female genital tract: Secondary | ICD-10-CM

## 2018-07-08 DIAGNOSIS — E519 Thiamine deficiency, unspecified: Secondary | ICD-10-CM

## 2018-07-08 DIAGNOSIS — M25522 Pain in left elbow: Secondary | ICD-10-CM

## 2018-07-08 DIAGNOSIS — D508 Other iron deficiency anemias: Secondary | ICD-10-CM | POA: Diagnosis not present

## 2018-07-08 DIAGNOSIS — E6609 Other obesity due to excess calories: Secondary | ICD-10-CM

## 2018-07-08 DIAGNOSIS — Z1239 Encounter for other screening for malignant neoplasm of breast: Secondary | ICD-10-CM

## 2018-07-08 DIAGNOSIS — Z6834 Body mass index (BMI) 34.0-34.9, adult: Secondary | ICD-10-CM

## 2018-07-08 NOTE — Progress Notes (Signed)
Virtual Visit via video Note  This visit type was conducted due to national recommendations for restrictions regarding the COVID-19 pandemic (e.g. social distancing).  This format is felt to be most appropriate for this patient at this time.  All issues noted in this document were discussed and addressed.  No physical exam was performed (except for noted visual exam findings with Video Visits).   I connected with Whitney Hill today at  9:30 AM EDT by a video enabled telemedicine application or telephone and verified that I am speaking with the correct person using two identifiers. Location patient: home Location provider: work  Persons participating in the virtual visit: patient, provider, Earley Abide, Carollee Leitz (were present in the room at the time of the visit)  I discussed the limitations, risks, security and privacy concerns of performing an evaluation and management service by telephone and the availability of in person appointments. I also discussed with the patient that there may be a patient responsible charge related to this service. The patient expressed understanding and agreed to proceed.   Reason for visit: Follow-up  HPI: Obesity: Patient notes she is walking for exercise.  This is somewhat limited by her orthopedic issues.  She is trying to eat plenty of protein and is limiting her portions now.  She cut out snack foods.  She tries to get some vegetables.  Her weight has only gone down 5 pounds over the last 7 weeks.  She is interested in resuming Saxenda if her insurance will cover this.  She has no personal or family history of medullary thyroid cancer, adrenal gland cancer, or parathyroid gland cancer.  Ankle pain: She has seen a podiatrist and had an injection which was not beneficial.  They noted it was inflamed.  She has an x-ray this week.  This hurts her when she walks.    Left arm pain: She does note she has continued to have some of this.  She has some left  elbow pain.  She has not seen orthopedics yet though she does have the number to call to schedule an appointment.  She saw neurology and noted that she did not need to follow-up with them and she should continue her thiamine supplement.  Family history of BRCA: Patient notes she was negative for testing previously.  Iron deficiency: Patient underwent GI evaluation with no cause found on EGD or colonoscopy.  She has not heard from hematology yet.  History of HPV positive Pap smear: She had a follow-up Pap smear that revealed no HPV.  She needs follow-up with GYN for her yearly exam.  ROS: See pertinent positives and negatives per HPI.  Past Medical History:  Diagnosis Date  . Arthritis    OA  . Complication of anesthesia    "ITCHING TO FACE, BENADRYL HELP BUT DROPS BLOOD PRESSURE"  . DVT (deep venous thrombosis) ([place])    following first knee surgery in 2011  . Gastric ulcer   . Headache   . Heterozygous factor V Leiden mutation ([place])   . Hypothyroid   . Iron deficiency anemia     Past Surgical History:  Procedure Laterality Date  . [place]  . CHOLECYSTECTOMY     1998  . COLONOSCOPY WITH PROPOFOL N/A 06/14/2018   Procedure: COLONOSCOPY WITH BIOPSY;  Surgeon: Lucilla Lame, MD;  Location: [facility];  Service: Endoscopy;  Laterality: N/A;  . DILATION AND CURETTAGE OF UTERUS     1994,  2001  . ESOPHAGOGASTRODUODENOSCOPY (EGD) WITH PROPOFOL N/A 06/14/2018   Procedure: ESOPHAGOGASTRODUODENOSCOPY (EGD) WITH PROPOFOL;  Surgeon: Lucilla Lame, MD;  Location: [facility];  Service: Endoscopy;  Laterality: N/A;  . FOREIGN BODY REMOVAL N/A 06/14/2018   Procedure: FOREIGN BODY REMOVAL;  Surgeon: Lucilla Lame, MD;  Location: [facility];  Service: Endoscopy;  Laterality: N/A;  Staple Removal from Stomach  . GASTRIC BYPASS     2012, revision 2013  . KNEE ARTHROSCOPY     2011, 2013, 2015  . POLYPECTOMY N/A 06/14/2018   Procedure: POLYPECTOMY;   Surgeon: Lucilla Lame, MD;  Location: [facility];  Service: Endoscopy;  Laterality: N/A;  . TOOTH EXTRACTION     2010  . TOTAL KNEE ARTHROPLASTY Left 05/25/2015   Procedure: LEFT TOTAL KNEE ARTHROPLASTY;  Surgeon: Renette Butters, MD;  Location: [facility];  Service: Orthopedics;  Laterality: Left;  . TUBAL LIGATION     2004    Family History  Problem Relation Age of Onset  . Arthritis Other        parent  . Hyperlipidemia Other        parent  . Stroke Other        parent  . Hypertension Other        parent  . Kidney disease Other        parent, grandparent  . Diabetes Other        parent, grandparent  . Renal cancer Other        parent  . Breast cancer Maternal Aunt 68       BRACA + dx twice age 69 and 55  . Breast cancer Maternal Grandmother        late 69's    SOCIAL HX: Non-smoker.   Current Outpatient Medications:  .  Cholecalciferol 50000 units capsule, Take 1 capsule (50,000 Units total) by mouth once a week., Disp: 13 capsule, Rfl: 1 .  diclofenac sodium (VOLTAREN) 1 % GEL, Apply 2 g topically 4 (four) times daily., Disp: , Rfl:  .  EPINEPHrine 0.3 mg/0.3 mL IJ SOAJ injection, Inject into the muscle., Disp: , Rfl:  .  multivitamin-iron-minerals-folic acid (CENTRUM) chewable tablet, Chew 1 tablet by mouth daily., Disp: , Rfl:  .  nystatin (NYSTATIN) powder, Apply topically 2 (two) times daily., Disp: 15 g, Rfl: 0 .  omalizumab (XOLAIR) 150 MG injection, Inject into the skin., Disp: , Rfl:  .  potassium chloride (K-DUR) 10 MEQ tablet, Take 1 tablet (10 mEq total) by mouth daily., Disp: 30 tablet, Rfl: 0 .  thiamine 100 MG tablet, Take 1 tablet (100 mg total) by mouth daily., Disp: , Rfl:  .  thyroid (NP THYROID) 120 MG tablet, Take 120 mg by mouth daily before breakfast., Disp: , Rfl:  .  triamcinolone cream (KENALOG) 0.1 %, Apply 1 application topically 2 (two) times daily., Disp: 30 g, Rfl: 0 .  famotidine (PEPCID) 40 MG tablet, Take 40 mg by mouth 2 (two)  times daily., Disp: , Rfl:  .  hydrOXYzine (ATARAX/VISTARIL) 25 MG tablet, Take 1 tablet (25 mg total) by mouth 3 (three) times daily as needed (itching). (Patient not taking: Reported on 07/08/2018), Disp: 30 tablet, Rfl: 0 .  levocetirizine (XYZAL) 5 MG tablet, , Disp: , Rfl:  .  Liraglutide -Weight Management (SAXENDA) 18 MG/3ML SOPN, Inject 0.6 mg into the skin daily for 7 days, THEN 1.2 mg daily for 7 days, THEN 1.8 mg daily for 7 days, THEN 2.4 mg daily  for 7 days, THEN 3 mg daily., Disp: 4 pen, Rfl: 3 .  montelukast (SINGULAIR) 10 MG tablet, Take 10 mg by mouth at bedtime., Disp: , Rfl:  .  Na Sulfate-K Sulfate-Mg Sulf (SUPREP BOWEL PREP KIT) 17.5-3.13-1.6 GM/177ML SOLN, Take 1 kit by mouth as directed. (Patient not taking: Reported on 07/08/2018), Disp: 1 Bottle, Rfl: 0 .  phentermine (ADIPEX-P) 37.5 MG tablet, Take 37.5 mg by mouth daily., Disp: , Rfl:   EXAM:  VITALS per patient if applicable: None.  GENERAL: alert, oriented, appears well and in no acute distress  HEENT: atraumatic, conjunttiva clear, no obvious abnormalities on inspection of external nose and ears  NECK: normal movements of the head and neck  LUNGS: on inspection no signs of respiratory distress, breathing rate appears normal, no obvious gross SOB, gasping or wheezing  CV: no obvious cyanosis  MS: moves all visible extremities without noticeable abnormality  PSYCH/NEURO: pleasant and cooperative, no obvious depression or anxiety, speech and thought processing grossly intact  ASSESSMENT AND PLAN:  Discussed the following assessment and plan:  Iron deficiency anemia We will refer to hematology again.  She has completed GI evaluation.  Left elbow pain She will contact orthopedics for follow-up.  Thiamine deficiency Continue thiamine supplement.  We will recheck her thiamine level with her next set of labs.  History of abnormal cervical Pap smear Most recent Pap smear was normal.  She will contact GYN to  schedule follow-up.  Obesity She has not had significant weight loss.  We will trial Saxenda.  This will be sent to her pharmacy.  Arthralgia Ankle issues.  She will see podiatry as planned.  Patient will contact the breast center to schedule her mammogram.  Mammogram ordered.   I discussed the assessment and treatment plan with the patient. The patient was provided an opportunity to ask questions and all were answered. The patient agreed with the plan and demonstrated an understanding of the instructions.   The patient was advised to call back or seek an in-person evaluation if the symptoms worsen or if the condition fails to improve as anticipated.    Tommi Rumps, MD

## 2018-07-09 ENCOUNTER — Encounter: Payer: Self-pay | Admitting: Family Medicine

## 2018-07-09 DIAGNOSIS — Z8742 Personal history of other diseases of the female genital tract: Secondary | ICD-10-CM | POA: Insufficient documentation

## 2018-07-09 MED ORDER — SAXENDA 18 MG/3ML ~~LOC~~ SOPN: PEN_INJECTOR | SUBCUTANEOUS | 3 refills | Status: DC

## 2018-07-09 NOTE — Assessment & Plan Note (Signed)
Continue thiamine supplement.  We will recheck her thiamine level with her next set of labs.

## 2018-07-09 NOTE — Assessment & Plan Note (Signed)
Ankle issues.  She will see podiatry as planned.

## 2018-07-09 NOTE — Assessment & Plan Note (Signed)
She has not had significant weight loss.  We will trial Saxenda.  This will be sent to her pharmacy.

## 2018-07-09 NOTE — Assessment & Plan Note (Signed)
We will refer to hematology again.  She has completed GI evaluation.

## 2018-07-09 NOTE — Assessment & Plan Note (Signed)
She will contact orthopedics for follow-up.

## 2018-07-09 NOTE — Assessment & Plan Note (Signed)
Most recent Pap smear was normal.  She will contact GYN to schedule follow-up.

## 2018-07-10 ENCOUNTER — Telehealth: Payer: Self-pay

## 2018-07-10 NOTE — Telephone Encounter (Signed)
Prior authorization was sent today and approved from 07/10/2018-07/10/2019. I called the pharmacy and informed them and approval document was sent to scan.  ,cma

## 2018-07-16 ENCOUNTER — Telehealth: Payer: Self-pay

## 2018-07-16 ENCOUNTER — Encounter: Payer: Self-pay | Admitting: Family Medicine

## 2018-07-16 NOTE — Telephone Encounter (Signed)
Prior authorization approved for Saxenda 18 mg.  Nina,cma

## 2018-07-17 ENCOUNTER — Other Ambulatory Visit (INDEPENDENT_AMBULATORY_CARE_PROVIDER_SITE_OTHER): Payer: Commercial Managed Care - PPO

## 2018-07-17 ENCOUNTER — Other Ambulatory Visit: Payer: Self-pay

## 2018-07-17 DIAGNOSIS — E519 Thiamine deficiency, unspecified: Secondary | ICD-10-CM

## 2018-07-17 DIAGNOSIS — E6609 Other obesity due to excess calories: Secondary | ICD-10-CM | POA: Diagnosis not present

## 2018-07-17 DIAGNOSIS — Z6834 Body mass index (BMI) 34.0-34.9, adult: Secondary | ICD-10-CM

## 2018-07-18 LAB — BASIC METABOLIC PANEL
BUN: 13 mg/dL (ref 6–23)
CO2: 24 mEq/L (ref 19–32)
Calcium: 8.6 mg/dL (ref 8.4–10.5)
Chloride: 107 mEq/L (ref 96–112)
Creatinine, Ser: 0.84 mg/dL (ref 0.40–1.20)
GFR: 71.68 mL/min (ref >60.00)
Glucose, Bld: 138 mg/dL — ABNORMAL HIGH (ref 70–99)
Potassium: 3.4 mEq/L — ABNORMAL LOW (ref 3.5–5.1)
Sodium: 139 mEq/L (ref 135–145)

## 2018-07-19 ENCOUNTER — Other Ambulatory Visit: Payer: Self-pay

## 2018-07-19 ENCOUNTER — Inpatient Hospital Stay: Payer: Commercial Managed Care - PPO | Attending: Oncology | Admitting: Oncology

## 2018-07-19 ENCOUNTER — Encounter: Payer: Self-pay | Admitting: Family Medicine

## 2018-07-19 ENCOUNTER — Inpatient Hospital Stay: Payer: Commercial Managed Care - PPO

## 2018-07-19 ENCOUNTER — Encounter: Payer: Self-pay | Admitting: Oncology

## 2018-07-19 VITALS — BP 118/73 | HR 50 | Temp 98.3°F | Resp 18 | Wt 230.0 lb

## 2018-07-19 DIAGNOSIS — R5383 Other fatigue: Secondary | ICD-10-CM | POA: Insufficient documentation

## 2018-07-19 DIAGNOSIS — Z8261 Family history of arthritis: Secondary | ICD-10-CM | POA: Diagnosis not present

## 2018-07-19 DIAGNOSIS — Z8249 Family history of ischemic heart disease and other diseases of the circulatory system: Secondary | ICD-10-CM | POA: Insufficient documentation

## 2018-07-19 DIAGNOSIS — Z803 Family history of malignant neoplasm of breast: Secondary | ICD-10-CM | POA: Insufficient documentation

## 2018-07-19 DIAGNOSIS — M2550 Pain in unspecified joint: Secondary | ICD-10-CM | POA: Insufficient documentation

## 2018-07-19 DIAGNOSIS — Z79899 Other long term (current) drug therapy: Secondary | ICD-10-CM | POA: Diagnosis not present

## 2018-07-19 DIAGNOSIS — Z9884 Bariatric surgery status: Secondary | ICD-10-CM | POA: Diagnosis not present

## 2018-07-19 DIAGNOSIS — Z83438 Family history of other disorder of lipoprotein metabolism and other lipidemia: Secondary | ICD-10-CM | POA: Diagnosis not present

## 2018-07-19 DIAGNOSIS — D509 Iron deficiency anemia, unspecified: Secondary | ICD-10-CM

## 2018-07-19 DIAGNOSIS — Z841 Family history of disorders of kidney and ureter: Secondary | ICD-10-CM | POA: Diagnosis not present

## 2018-07-19 DIAGNOSIS — D508 Other iron deficiency anemias: Secondary | ICD-10-CM | POA: Diagnosis not present

## 2018-07-19 DIAGNOSIS — D649 Anemia, unspecified: Secondary | ICD-10-CM

## 2018-07-19 DIAGNOSIS — E519 Thiamine deficiency, unspecified: Secondary | ICD-10-CM

## 2018-07-19 DIAGNOSIS — Z8051 Family history of malignant neoplasm of kidney: Secondary | ICD-10-CM | POA: Diagnosis not present

## 2018-07-19 DIAGNOSIS — Z86718 Personal history of other venous thrombosis and embolism: Secondary | ICD-10-CM | POA: Insufficient documentation

## 2018-07-19 DIAGNOSIS — Z823 Family history of stroke: Secondary | ICD-10-CM | POA: Insufficient documentation

## 2018-07-19 LAB — COMPREHENSIVE METABOLIC PANEL
ALT: 9 U/L (ref 0–44)
AST: 14 U/L — ABNORMAL LOW (ref 15–41)
Albumin: 3.6 g/dL (ref 3.5–5.0)
Alkaline Phosphatase: 78 U/L (ref 38–126)
Anion gap: 6 (ref 5–15)
BUN: 14 mg/dL (ref 6–20)
CO2: 25 mmol/L (ref 22–32)
Calcium: 8.9 mg/dL (ref 8.9–10.3)
Chloride: 107 mmol/L (ref 98–111)
Creatinine, Ser: 0.59 mg/dL (ref 0.44–1.00)
GFR calc Af Amer: >60 mL/min (ref >60)
GFR calc non Af Amer: >60 mL/min (ref >60)
Glucose, Bld: 95 mg/dL (ref 70–99)
Potassium: 3.1 mmol/L — ABNORMAL LOW (ref 3.5–5.1)
Sodium: 138 mmol/L (ref 135–145)
Total Bilirubin: 0.3 mg/dL (ref 0.3–1.2)
Total Protein: 6.8 g/dL (ref 6.5–8.1)

## 2018-07-19 LAB — CBC WITH DIFFERENTIAL/PLATELET
Abs Immature Granulocytes: 0.04 10*3/uL (ref 0.00–0.07)
Basophils Absolute: 0 10*3/uL (ref 0.0–0.1)
Basophils Relative: 1 %
Eosinophils Absolute: 0.2 10*3/uL (ref 0.0–0.5)
Eosinophils Relative: 2 %
HCT: 36.8 % (ref 36.0–46.0)
Hemoglobin: 12.3 g/dL (ref 12.0–15.0)
Immature Granulocytes: 1 %
Lymphocytes Relative: 27 %
Lymphs Abs: 2.1 10*3/uL (ref 0.7–4.0)
MCH: 32.2 pg (ref 26.0–34.0)
MCHC: 33.4 g/dL (ref 30.0–36.0)
MCV: 96.3 fL (ref 80.0–100.0)
Monocytes Absolute: 0.5 10*3/uL (ref 0.1–1.0)
Monocytes Relative: 7 %
Neutro Abs: 5 10*3/uL (ref 1.7–7.7)
Neutrophils Relative %: 62 %
Platelets: 302 10*3/uL (ref 150–400)
RBC: 3.82 MIL/uL — ABNORMAL LOW (ref 3.87–5.11)
RDW: 13.2 % (ref 11.5–15.5)
WBC: 7.8 10*3/uL (ref 4.0–10.5)
nRBC: 0 % (ref 0.0–0.2)

## 2018-07-19 LAB — IRON AND TIBC
Iron: 62 ug/dL (ref 28–170)
Saturation Ratios: 16 % (ref 10.4–31.8)
TIBC: 391 ug/dL (ref 250–450)
UIBC: 329 ug/dL

## 2018-07-19 LAB — RETICULOCYTES
Immature Retic Fract: 10 % (ref 2.3–15.9)
RBC.: 3.82 MIL/uL — ABNORMAL LOW (ref 3.87–5.11)
Retic Count, Absolute: 97 10*3/uL (ref 19.0–186.0)
Retic Ct Pct: 2.5 % (ref 0.4–3.1)

## 2018-07-19 LAB — TSH: TSH: 5.958 u[IU]/mL — ABNORMAL HIGH (ref 0.350–4.500)

## 2018-07-19 LAB — FOLATE: Folate: 3.5 ng/mL — ABNORMAL LOW (ref >5.9)

## 2018-07-19 LAB — FERRITIN: Ferritin: 16 ng/mL (ref 11–307)

## 2018-07-19 LAB — VITAMIN B12: Vitamin B-12: 344 pg/mL (ref 180–914)

## 2018-07-19 NOTE — Progress Notes (Signed)
Pt in for follow up related to anemia.

## 2018-07-22 ENCOUNTER — Other Ambulatory Visit: Payer: Self-pay

## 2018-07-22 ENCOUNTER — Encounter: Payer: Self-pay | Admitting: Oncology

## 2018-07-22 ENCOUNTER — Inpatient Hospital Stay (HOSPITAL_BASED_OUTPATIENT_CLINIC_OR_DEPARTMENT_OTHER): Payer: Commercial Managed Care - PPO | Admitting: Oncology

## 2018-07-22 ENCOUNTER — Telehealth: Payer: Self-pay

## 2018-07-22 DIAGNOSIS — D508 Other iron deficiency anemias: Secondary | ICD-10-CM | POA: Diagnosis not present

## 2018-07-22 DIAGNOSIS — E538 Deficiency of other specified B group vitamins: Secondary | ICD-10-CM | POA: Diagnosis not present

## 2018-07-22 DIAGNOSIS — E876 Hypokalemia: Secondary | ICD-10-CM

## 2018-07-22 DIAGNOSIS — Z9884 Bariatric surgery status: Secondary | ICD-10-CM

## 2018-07-22 DIAGNOSIS — E611 Iron deficiency: Secondary | ICD-10-CM

## 2018-07-22 MED ORDER — POTASSIUM CHLORIDE CRYS ER 20 MEQ PO TBCR: 20.0000 meq | EXTENDED_RELEASE_TABLET | Freq: Every day | ORAL | 0 refills | Status: DC

## 2018-07-22 MED ORDER — FOLIC ACID 1 MG PO TABS: 1.0000 mg | ORAL_TABLET | Freq: Every day | ORAL | 3 refills | Status: DC

## 2018-07-22 NOTE — Progress Notes (Signed)
MD to discuss lab results.

## 2018-07-22 NOTE — Progress Notes (Signed)
Called patient and medication sent to the pharmacy

## 2018-07-22 NOTE — Telephone Encounter (Signed)
Called patient to let her know we are sending Potassium to pharmacy. She had a question about her iron, she asked about iron infusion, she does have Video visit on 08/02/2018.

## 2018-07-22 NOTE — Progress Notes (Signed)
Hematology/Oncology Consult note [facility]  [phone] [phone]  Patient Care Team: Leone Haven, MD as PCP - General (Family Medicine) Kennith Center, RD as Dietitian (Family Medicine)   Name of the patient: Whitney Hill  [id number]  [birth year]   Date of visit: 07/22/18  Diagnosis-history of iron deficiency anemia secondary to gastric bypass  Chief complaint/ Reason for visit-patient is here to reestablish follow-up for her anemia  Heme/Onc history: Patient is a 48 year old female with a history of iron deficiency anemia secondary to gastric bypass in 2012. She gets periodic ferraheme. Also has a history of left lower extremity DVT for which she was in 3 months of anticoagulation. Family history positive for factor V Leiden in her mother. He is currently not on long-term anticoagulation. Last Shirlean Kelly was given in January 2018. Patient has been referred by Dr. Caryl Bis for her anemia.  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  Her most recent B1 levels in January 2020 were low at 161.  Most recent iron studies from April 2020 showed a low ferritin of 17.  CMP at that time was normal.  The last CBC that I have for her is from January 2020 when her hemoglobin was 12 and hematocrit 36.7.  Interval history-patient reports having fatigue as well as significant pain in her left upper extremity for which she was referred to orthopedics as well as rheumatology.  She reports that presently her pain is improved especially after B1 supplementation.  She has a good appetite and denies any unintentional weight loss.  ECOG PS- 1 Pain scale- 0  Review of systems- Review of Systems  Constitutional: Positive for malaise/fatigue. Negative for chills, fever and weight loss.  HENT: Negative for congestion, ear discharge and nosebleeds.   Eyes: Negative for blurred vision.   Respiratory: Negative for cough, hemoptysis, sputum production, shortness of breath and wheezing.   Cardiovascular: Negative for chest pain, palpitations, orthopnea and claudication.  Gastrointestinal: Negative for abdominal pain, blood in stool, constipation, diarrhea, heartburn, melena, nausea and vomiting.  Genitourinary: Negative for dysuria, flank pain, frequency, hematuria and urgency.  Musculoskeletal: Positive for joint pain. Negative for back pain and myalgias.  Skin: Negative for rash.  Neurological: Negative for dizziness, tingling, focal weakness, seizures, weakness and headaches.  Endo/Heme/Allergies: Does not bruise/bleed easily.  Psychiatric/Behavioral: Negative for depression and suicidal ideas. The patient does not have insomnia.        Allergies  Allergen Reactions  . Paxil [Paroxetine Hcl] Anaphylaxis    Throat swelled shut  . Penicillins Hives     Past Medical History:  Diagnosis Date  . Arthritis    OA  . Complication of anesthesia    "ITCHING TO FACE, BENADRYL HELP BUT DROPS BLOOD PRESSURE"  . DVT (deep venous thrombosis) ([place])    following first knee surgery in 2011  . Gastric ulcer   . Headache   . Heterozygous factor V Leiden mutation ([place])   . Hypothyroid   . Iron deficiency anemia      Past Surgical History:  Procedure Laterality Date  . [place]  . CHOLECYSTECTOMY     1998  . COLONOSCOPY WITH PROPOFOL N/A 06/14/2018   Procedure: COLONOSCOPY WITH BIOPSY;  Surgeon: Lucilla Lame, MD;  Location: [facility];  Service: Endoscopy;  Laterality: N/A;  . DILATION AND CURETTAGE OF UTERUS  1994, 2001  . ESOPHAGOGASTRODUODENOSCOPY (EGD) WITH PROPOFOL N/A 06/14/2018   Procedure: ESOPHAGOGASTRODUODENOSCOPY (EGD) WITH PROPOFOL;  Surgeon: Lucilla Lame, MD;  Location: [facility];  Service: Endoscopy;  Laterality: N/A;  . FOREIGN BODY REMOVAL N/A 06/14/2018   Procedure: FOREIGN BODY REMOVAL;  Surgeon: Lucilla Lame, MD;  Location: [facility];  Service: Endoscopy;  Laterality: N/A;  Staple Removal from Stomach  . GASTRIC BYPASS     2012, revision 2013  . KNEE ARTHROSCOPY     2011, 2013, 2015  . POLYPECTOMY N/A 06/14/2018   Procedure: POLYPECTOMY;  Surgeon: Lucilla Lame, MD;  Location: [facility];  Service: Endoscopy;  Laterality: N/A;  . TOOTH EXTRACTION     2010  . TOTAL KNEE ARTHROPLASTY Left 05/25/2015   Procedure: LEFT TOTAL KNEE ARTHROPLASTY;  Surgeon: Renette Butters, MD;  Location: [facility];  Service: Orthopedics;  Laterality: Left;  . TUBAL LIGATION     2004    Social History   Socioeconomic History  . Marital status: Married    Spouse name: Not on file  . Number of children: Not on file  . Years of education: Not on file  . Highest education level: Not on file  Occupational History  . Not on file  Social Needs  . Financial resource strain: Not on file  . Food insecurity    Worry: Not on file    Inability: Not on file  . Transportation needs    Medical: Not on file    Non-medical: Not on file  Tobacco Use  . Smoking status: Never Smoker  . Smokeless tobacco: Never Used  Substance and Sexual Activity  . Alcohol use: No    Alcohol/week: 0.0 standard drinks  . Drug use: No  . Sexual activity: Not on file  Lifestyle  . Physical activity    Days per week: Not on file    Minutes per session: Not on file  . Stress: Not on file  Relationships  . Social Herbalist on phone: Not on file    Gets together: Not on file    Attends religious service: Not on file    Active member of club or organization: Not on file    Attends meetings of clubs or organizations: Not on file    Relationship status: Not on file  . Intimate partner violence    Fear of current or ex partner: Not on file    Emotionally abused: Not on file    Physically abused: Not on file    Forced sexual activity: Not on file  Other Topics Concern  . Not on file  Social History  Narrative  . Not on file    Family History  Problem Relation Age of Onset  . Arthritis Other        parent  . Hyperlipidemia Other        parent  . Stroke Other        parent  . Hypertension Other        parent  . Kidney disease Other        parent, grandparent  . Diabetes Other        parent, grandparent  . Renal cancer Other        parent  . Breast cancer Maternal Aunt 45       BRACA + dx twice age 50 and 4  . Breast cancer Maternal Grandmother        late 34's  Current Outpatient Medications:  .  Cholecalciferol 50000 units capsule, Take 1 capsule (50,000 Units total) by mouth once a week., Disp: 13 capsule, Rfl: 1 .  diclofenac sodium (VOLTAREN) 1 % GEL, Apply 2 g topically 4 (four) times daily., Disp: , Rfl:  .  EPINEPHrine 0.3 mg/0.3 mL IJ SOAJ injection, Inject into the muscle., Disp: , Rfl:  .  multivitamin-iron-minerals-folic acid (CENTRUM) chewable tablet, Chew 1 tablet by mouth daily., Disp: , Rfl:  .  nystatin (NYSTATIN) powder, Apply topically 2 (two) times daily., Disp: 15 g, Rfl: 0 .  omalizumab (XOLAIR) 150 MG injection, Inject into the skin., Disp: , Rfl:  .  thiamine 100 MG tablet, Take 1 tablet (100 mg total) by mouth daily., Disp: , Rfl:  .  thyroid (NP THYROID) 120 MG tablet, Take 120 mg by mouth daily before breakfast., Disp: , Rfl:  .  triamcinolone cream (KENALOG) 0.1 %, Apply 1 application topically 2 (two) times daily., Disp: 30 g, Rfl: 0 .  famotidine (PEPCID) 40 MG tablet, Take 40 mg by mouth 2 (two) times daily., Disp: , Rfl:  .  hydrOXYzine (ATARAX/VISTARIL) 25 MG tablet, Take 1 tablet (25 mg total) by mouth 3 (three) times daily as needed (itching). (Patient not taking: Reported on 07/08/2018), Disp: 30 tablet, Rfl: 0 .  levocetirizine (XYZAL) 5 MG tablet, , Disp: , Rfl:  .  Liraglutide -Weight Management (SAXENDA) 18 MG/3ML SOPN, Inject 0.6 mg into the skin daily for 7 days, THEN 1.2 mg daily for 7 days, THEN 1.8 mg daily for 7 days, THEN 2.4  mg daily for 7 days, THEN 3 mg daily. (Patient not taking: Reported on 07/19/2018), Disp: 4 pen, Rfl: 3 .  montelukast (SINGULAIR) 10 MG tablet, Take 10 mg by mouth at bedtime., Disp: , Rfl:  .  phentermine (ADIPEX-P) 37.5 MG tablet, Take 37.5 mg by mouth daily., Disp: , Rfl:  .  potassium chloride (K-DUR) 10 MEQ tablet, Take 1 tablet (10 mEq total) by mouth daily. (Patient not taking: Reported on 07/19/2018), Disp: 30 tablet, Rfl: 0  Physical exam:  Vitals:   07/19/18 1039  BP: 118/73  Pulse: (!) 50  Resp: 18  Temp: 98.3 F (36.8 C)  TempSrc: Tympanic  SpO2: 100%  Weight: 230 lb (104.3 kg)   Physical Exam Constitutional:      General: She is not in acute distress. HENT:     Head: Normocephalic and atraumatic.  Eyes:     Pupils: Pupils are equal, round, and reactive to light.  Neck:     Musculoskeletal: Normal range of motion.  Cardiovascular:     Rate and Rhythm: Normal rate and regular rhythm.     Heart sounds: Normal heart sounds.  Pulmonary:     Effort: Pulmonary effort is normal.     Breath sounds: Normal breath sounds.  Abdominal:     General: Bowel sounds are normal.     Palpations: Abdomen is soft.  Skin:    General: Skin is warm and dry.  Neurological:     Mental Status: She is alert and oriented to person, place, and time.      CMP Latest Ref Rng & Units 07/19/2018  Glucose 70 - 99 mg/dL 95  BUN 6 - 20 mg/dL 14  Creatinine 0.44 - 1.00 mg/dL 0.59  Sodium 135 - 145 mmol/L 138  Potassium 3.5 - 5.1 mmol/L 3.1(L)  Chloride 98 - 111 mmol/L 107  CO2 22 - 32 mmol/L 25  Calcium 8.9 - 10.3  mg/dL 8.9  Total Protein 6.5 - 8.1 g/dL 6.8  Total Bilirubin 0.3 - 1.2 mg/dL 0.3  Alkaline Phos 38 - 126 U/L 78  AST 15 - 41 U/L 14(L)  ALT 0 - 44 U/L 9   CBC Latest Ref Rng & Units 07/19/2018  WBC 4.0 - 10.5 K/uL 7.8  Hemoglobin 12.0 - 15.0 g/dL 12.3  Hematocrit 36.0 - 46.0 % 36.8  Platelets 150 - 400 K/uL 302     Assessment and plan- Patient is a 48 y.o. female  referred for anemia  I do not have any recent hemoglobin results for the patient and her last hemoglobin in January 2020 was normal at 12.  Her last ferritin in April 2020 was low at 14. I will plan to check a CBC with differential, CMP, ferritin and iron studies, B12, folate, TSH, reticulocyte count today.  Video visit with MD in 2 weeks.  She did undergo EGD and colonoscopy on 06/14/2018 which did not show any evidence of bleeding or malignancy   Visit Diagnosis 1. Iron deficiency anemia, unspecified iron deficiency anemia type   2. Normocytic anemia   3. History of gastric bypass      Dr. Randa Evens, MD, MPH [facility] [phone] 07/22/2018 8:46 AM

## 2018-07-22 NOTE — Telephone Encounter (Signed)
-----   Message from Sindy Guadeloupe, MD sent at 07/19/2018 12:27 PM EDT ----- [place]- Can you call in for oral potassium 20 meq daily for a week? She will need repeat bmp in 10 days. Thanks, Astrid Divine

## 2018-07-22 NOTE — Progress Notes (Signed)
I connected with Whitney Hill on 07/22/18 at 10:30 AM EDT by video enabled telemedicine visit and verified that I am speaking with the correct person using two identifiers.   I discussed the limitations, risks, security and privacy concerns of performing an evaluation and management service by telemedicine and the availability of in-person appointments. I also discussed with the patient that there may be a patient responsible charge related to this service. The patient expressed understanding and agreed to proceed.  Other persons participating in the visit and their role in the encounter:  none  Diagnosis: History of iron deficiency anemia status post gastric bypass   Chief complaint discuss results of blood work  Patient's location:  home Provider's location:  Work  History of presenting illness: Patient is a 48 year old female with a history of iron deficiency anemia secondary to gastric bypass in 2012. She gets periodic ferraheme. Also has a history of left lower extremity DVT for which she was in 3 months of anticoagulation. Family history positive for factor V Leiden in her mother. He is currently not on long-term anticoagulation. Last Shirlean Kelly was given in January 2018. Patient has been referred by Dr. Caryl Bis for her anemia.  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  Her most recent B1 levels in January 2020 were low at 161.  Most recent iron studies from April 2020 showed a low ferritin of 17.  CMP at that time was normal.  The last CBC that I have for her is from January 2020 when her hemoglobin was 12 and hematocrit 36.7.  Results of blood work from 07/19/2018 were as follows: CBC with differential showed white count of 7.8, H&H of 12.3/36.8 with an MCV of 96.3 and a platelet count of 302.  CMP showed mildly low potassium of 3.1.  Ferritin levels were low at 16.  Iron studies were normal.  B12 level was normal at 344.   Folate was normal at 3.5.  TSH was mildly elevated at 5.9.  Reticulocyte count was low normal at 2.5%.   Review of Systems  Constitutional: Positive for malaise/fatigue. Negative for chills, fever and weight loss.  HENT: Negative for congestion, ear discharge and nosebleeds.   Eyes: Negative for blurred vision.  Respiratory: Negative for cough, hemoptysis, sputum production, shortness of breath and wheezing.   Cardiovascular: Negative for chest pain, palpitations, orthopnea and claudication.  Gastrointestinal: Negative for abdominal pain, blood in stool, constipation, diarrhea, heartburn, melena, nausea and vomiting.  Genitourinary: Negative for dysuria, flank pain, frequency, hematuria and urgency.  Musculoskeletal: Negative for back pain, joint pain and myalgias.       Left upper extremity pain  Skin: Negative for rash.  Neurological: Negative for dizziness, tingling, focal weakness, seizures, weakness and headaches.  Endo/Heme/Allergies: Does not bruise/bleed easily.  Psychiatric/Behavioral: Negative for depression and suicidal ideas. The patient does not have insomnia.     Interval history she is currently awaiting the results of her B1 levels.  She reports some fatigue and left upper extremity pain but denies other complaints     Allergies  Allergen Reactions  . Paxil [Paroxetine Hcl] Anaphylaxis    Throat swelled shut  . Penicillins Hives    Past Medical History:  Diagnosis Date  . Arthritis    OA  . Complication of anesthesia    "ITCHING TO FACE, BENADRYL HELP BUT DROPS BLOOD PRESSURE"  . DVT (deep venous thrombosis) (HCC)    following first  knee surgery in 2011  . Gastric ulcer   . Headache   . Heterozygous factor V Leiden mutation ([place])   . Hypothyroid   . Iron deficiency anemia     Past Surgical History:  Procedure Laterality Date  . [place]  . CHOLECYSTECTOMY     1998  . COLONOSCOPY WITH PROPOFOL N/A 06/14/2018   Procedure:  COLONOSCOPY WITH BIOPSY;  Surgeon: Lucilla Lame, MD;  Location: [facility];  Service: Endoscopy;  Laterality: N/A;  . DILATION AND CURETTAGE OF UTERUS     1994, 2001  . ESOPHAGOGASTRODUODENOSCOPY (EGD) WITH PROPOFOL N/A 06/14/2018   Procedure: ESOPHAGOGASTRODUODENOSCOPY (EGD) WITH PROPOFOL;  Surgeon: Lucilla Lame, MD;  Location: [facility];  Service: Endoscopy;  Laterality: N/A;  . FOREIGN BODY REMOVAL N/A 06/14/2018   Procedure: FOREIGN BODY REMOVAL;  Surgeon: Lucilla Lame, MD;  Location: [facility];  Service: Endoscopy;  Laterality: N/A;  Staple Removal from Stomach  . GASTRIC BYPASS     2012, revision 2013  . KNEE ARTHROSCOPY     2011, 2013, 2015  . POLYPECTOMY N/A 06/14/2018   Procedure: POLYPECTOMY;  Surgeon: Lucilla Lame, MD;  Location: [facility];  Service: Endoscopy;  Laterality: N/A;  . TOOTH EXTRACTION     2010  . TOTAL KNEE ARTHROPLASTY Left 05/25/2015   Procedure: LEFT TOTAL KNEE ARTHROPLASTY;  Surgeon: Renette Butters, MD;  Location: [facility];  Service: Orthopedics;  Laterality: Left;  . TUBAL LIGATION     2004    Social History   Socioeconomic History  . Marital status: Married    Spouse name: Not on file  . Number of children: Not on file  . Years of education: Not on file  . Highest education level: Not on file  Occupational History  . Not on file  Social Needs  . Financial resource strain: Not on file  . Food insecurity    Worry: Not on file    Inability: Not on file  . Transportation needs    Medical: Not on file    Non-medical: Not on file  Tobacco Use  . Smoking status: Never Smoker  . Smokeless tobacco: Never Used  Substance and Sexual Activity  . Alcohol use: No    Alcohol/week: 0.0 standard drinks  . Drug use: No  . Sexual activity: Not on file  Lifestyle  . Physical activity    Days per week: Not on file    Minutes per session: Not on file  . Stress: Not on file  Relationships  . Social Herbalist on  phone: Not on file    Gets together: Not on file    Attends religious service: Not on file    Active member of club or organization: Not on file    Attends meetings of clubs or organizations: Not on file    Relationship status: Not on file  . Intimate partner violence    Fear of current or ex partner: Not on file    Emotionally abused: Not on file    Physically abused: Not on file    Forced sexual activity: Not on file  Other Topics Concern  . Not on file  Social History Narrative  . Not on file    Family History  Problem Relation Age of Onset  . Arthritis Other        parent  . Hyperlipidemia Other        parent  .  Stroke Other        parent  . Hypertension Other        parent  . Kidney disease Other        parent, grandparent  . Diabetes Other        parent, grandparent  . Renal cancer Other        parent  . Breast cancer Maternal Aunt 29       BRACA + dx twice age 28 and 53  . Breast cancer Maternal Grandmother        late 50's     Current Outpatient Medications:  .  Cholecalciferol 50000 units capsule, Take 1 capsule (50,000 Units total) by mouth once a week., Disp: 13 capsule, Rfl: 1 .  diclofenac sodium (VOLTAREN) 1 % GEL, Apply 2 g topically 4 (four) times daily., Disp: , Rfl:  .  EPINEPHrine 0.3 mg/0.3 mL IJ SOAJ injection, Inject into the muscle., Disp: , Rfl:  .  multivitamin-iron-minerals-folic acid (CENTRUM) chewable tablet, Chew 1 tablet by mouth daily., Disp: , Rfl:  .  nystatin (NYSTATIN) powder, Apply topically 2 (two) times daily. (Patient taking differently: Apply topically 2 (two) times daily as needed. ), Disp: 15 g, Rfl: 0 .  omalizumab (XOLAIR) 150 MG injection, Inject 150 mg into the skin every 28 (twenty-eight) days. , Disp: , Rfl:  .  potassium chloride SA (K-DUR) 20 MEQ tablet, Take 1 tablet (20 mEq total) by mouth daily., Disp: 10 tablet, Rfl: 0 .  thiamine 100 MG tablet, Take 1 tablet (100 mg total) by mouth daily., Disp: , Rfl:  .  thyroid  (NP THYROID) 120 MG tablet, Take 120 mg by mouth daily before breakfast., Disp: , Rfl:  .  triamcinolone cream (KENALOG) 0.1 %, Apply 1 application topically 2 (two) times daily. (Patient taking differently: Apply 1 application topically 2 (two) times daily as needed. ), Disp: 30 g, Rfl: 0 .  folic acid (FOLVITE) 1 MG tablet, Take 1 tablet (1 mg total) by mouth daily., Disp: 30 tablet, Rfl: 3 .  Liraglutide -Weight Management (SAXENDA) 18 MG/3ML SOPN, Inject 0.6 mg into the skin daily for 7 days, THEN 1.2 mg daily for 7 days, THEN 1.8 mg daily for 7 days, THEN 2.4 mg daily for 7 days, THEN 3 mg daily. (Patient not taking: Reported on 07/19/2018), Disp: 4 pen, Rfl: 3  No results found.  No images are attached to the encounter.   CMP Latest Ref Rng & Units 07/19/2018  Glucose 70 - 99 mg/dL 95  BUN 6 - 20 mg/dL 14  Creatinine 0.44 - 1.00 mg/dL 0.59  Sodium 135 - 145 mmol/L 138  Potassium 3.5 - 5.1 mmol/L 3.1(L)  Chloride 98 - 111 mmol/L 107  CO2 22 - 32 mmol/L 25  Calcium 8.9 - 10.3 mg/dL 8.9  Total Protein 6.5 - 8.1 g/dL 6.8  Total Bilirubin 0.3 - 1.2 mg/dL 0.3  Alkaline Phos 38 - 126 U/L 78  AST 15 - 41 U/L 14(L)  ALT 0 - 44 U/L 9   CBC Latest Ref Rng & Units 07/19/2018  WBC 4.0 - 10.5 K/uL 7.8  Hemoglobin 12.0 - 15.0 g/dL 12.3  Hematocrit 36.0 - 46.0 % 36.8  Platelets 150 - 400 K/uL 302     Observation/objective: Appears in no acute distress of a video visit today.  Breathing is nonlabored  Assessment and plan: Patient is a 48 year old female with a history of gastric bypass referred for anemia  Based on results of  her recent blood work patient is not anemic and her H&H is normal at 12.3/36.8.  Further work-up does reveal that her folic acid is low at 3.5 and I have sent her a prescription for folic acid 1 mg tablet daily.  She also has a low ferritin of 68.  Patient would like Feraheme as she has problems tolerating her oral iron and given her history of gastric bypass I think  would be reasonable.  She will proceed with 2 doses of Feraheme 510 mg IV weekly which she has tolerated well in the past.  Hypokalemia: I have recommended that she should take oral potassium 20 mEq daily for 10 days.  Follow-up instructions: CBC ferritin and iron studies and see MD in 2 months  I discussed the assessment and treatment plan with the patient. The patient was provided an opportunity to ask questions and all were answered. The patient agreed with the plan and demonstrated an understanding of the instructions.   The patient was advised to call back or seek an in-person evaluation if the symptoms worsen or if the condition fails to improve as anticipated.    Visit Diagnosis: 1. Iron deficiency   2. Folate deficiency   3. Hypokalemia     Dr. Randa Evens, MD, MPH [facility] [phone] 07/22/2018 1:40 PM

## 2018-07-23 ENCOUNTER — Telehealth: Payer: Self-pay

## 2018-07-23 LAB — VITAMIN B1: Vitamin B1 (Thiamine): 14 nmol/L (ref 8–30)

## 2018-07-23 NOTE — Telephone Encounter (Signed)
Patient staes the provider answered her by mychart.  Nina,cma

## 2018-07-23 NOTE — Telephone Encounter (Signed)
Copied from [place] [id number]. Topic: General - Other >> Jul 23, 2018 10:01 AM Rayann Heman wrote: Reason for CRM: pt called and stated that she would like a call back from the office regarding recent lab results. Please advise

## 2018-07-25 ENCOUNTER — Inpatient Hospital Stay: Payer: Commercial Managed Care - PPO

## 2018-07-26 ENCOUNTER — Other Ambulatory Visit: Payer: Self-pay

## 2018-07-26 ENCOUNTER — Inpatient Hospital Stay: Payer: Commercial Managed Care - PPO

## 2018-07-26 VITALS — BP 122/76 | HR 87 | Temp 98.1°F | Resp 18

## 2018-07-26 DIAGNOSIS — E611 Iron deficiency: Secondary | ICD-10-CM

## 2018-07-26 DIAGNOSIS — D508 Other iron deficiency anemias: Secondary | ICD-10-CM

## 2018-07-26 MED ORDER — SODIUM CHLORIDE 0.9 % IV SOLN
510.0000 mg | Freq: Once | INTRAVENOUS | Status: AC
  Administered 2018-07-26: 510 mg via INTRAVENOUS
  Filled 2018-07-26: qty 17

## 2018-07-26 MED ORDER — SODIUM CHLORIDE 0.9 % IV SOLN
Freq: Once | INTRAVENOUS | Status: AC
  Administered 2018-07-26: 14:00:00 via INTRAVENOUS
  Filled 2018-07-26: qty 250

## 2018-07-26 MED ORDER — THYROID 120 MG PO TABS: 120.0000 mg | ORAL_TABLET | Freq: Every day | ORAL | 1 refills | Status: DC

## 2018-08-01 ENCOUNTER — Ambulatory Visit: Payer: Commercial Managed Care - PPO

## 2018-08-01 ENCOUNTER — Other Ambulatory Visit: Payer: Self-pay

## 2018-08-01 ENCOUNTER — Inpatient Hospital Stay: Payer: Commercial Managed Care - PPO

## 2018-08-02 ENCOUNTER — Inpatient Hospital Stay: Payer: Commercial Managed Care - PPO

## 2018-08-02 ENCOUNTER — Other Ambulatory Visit: Payer: Self-pay

## 2018-08-02 ENCOUNTER — Ambulatory Visit: Payer: Commercial Managed Care - PPO | Admitting: Oncology

## 2018-08-02 VITALS — BP 101/65 | HR 81 | Temp 97.2°F | Resp 18

## 2018-08-02 DIAGNOSIS — D508 Other iron deficiency anemias: Secondary | ICD-10-CM

## 2018-08-02 DIAGNOSIS — E611 Iron deficiency: Secondary | ICD-10-CM

## 2018-08-02 MED ORDER — SODIUM CHLORIDE 0.9 % IV SOLN
Freq: Once | INTRAVENOUS | Status: AC
  Administered 2018-08-02: 14:00:00 via INTRAVENOUS
  Filled 2018-08-02: qty 250

## 2018-08-02 MED ORDER — SODIUM CHLORIDE 0.9 % IV SOLN
510.0000 mg | Freq: Once | INTRAVENOUS | Status: AC
  Administered 2018-08-02: 510 mg via INTRAVENOUS
  Filled 2018-08-02: qty 17

## 2018-08-02 NOTE — Progress Notes (Signed)
Pt tolerated infusion well. Pt declines staying for full 30 minute observation. Pt and VS stable at discharge.

## 2018-08-06 ENCOUNTER — Encounter: Payer: Self-pay | Admitting: Family Medicine

## 2018-08-11 MED ORDER — ONDANSETRON HCL 4 MG PO TABS: 4.0000 mg | ORAL_TABLET | Freq: Three times a day (TID) | ORAL | 0 refills | Status: DC | PRN

## 2018-08-15 NOTE — Telephone Encounter (Signed)
   Hey!    I would recommend going back to the 1.2 mg dose for 2-3 weeks, then can try 1.8 again. Sounds like she did 0.6 x1 week, then 1.2 x 1 week, then 1.8? Very fast titration.     There are ~59 "clicks" in between 1.2 and 1.8. If it ends up that she can't tolerate 1.8, she can try a few "clicks" below 1.8, and see if she can tolerate this in between dose without nausea.

## 2018-08-30 ENCOUNTER — Telehealth (INDEPENDENT_AMBULATORY_CARE_PROVIDER_SITE_OTHER): Payer: Commercial Managed Care - PPO | Admitting: Family Medicine

## 2018-08-30 ENCOUNTER — Other Ambulatory Visit: Payer: Self-pay

## 2018-08-30 ENCOUNTER — Encounter: Payer: Self-pay | Admitting: Family Medicine

## 2018-08-30 DIAGNOSIS — E6609 Other obesity due to excess calories: Secondary | ICD-10-CM | POA: Diagnosis not present

## 2018-08-30 DIAGNOSIS — E519 Thiamine deficiency, unspecified: Secondary | ICD-10-CM | POA: Diagnosis not present

## 2018-08-30 DIAGNOSIS — Z6836 Body mass index (BMI) 36.0-36.9, adult: Secondary | ICD-10-CM

## 2018-08-30 MED ORDER — SAXENDA 18 MG/3ML ~~LOC~~ SOPN: 3.0000 mg | PEN_INJECTOR | Freq: Every day | SUBCUTANEOUS | 1 refills | Status: DC

## 2018-08-30 NOTE — Progress Notes (Signed)
Virtual Visit via video Note  This visit type was conducted due to national recommendations for restrictions regarding the COVID-19 pandemic (e.g. social distancing).  This format is felt to be most appropriate for this patient at this time.  All issues noted in this document were discussed and addressed.  No physical exam was performed (except for noted visual exam findings with Video Visits).   I connected with Whitney Hill today at  4:00 PM EDT by a video enabled telemedicine application or telephone and verified that I am speaking with the correct person using two identifiers. Location patient: mom's house Location provider: work  Persons participating in the virtual visit: patient, provider  I discussed the limitations, risks, security and privacy concerns of performing an evaluation and management service by telephone and the availability of in person appointments. I also discussed with the patient that there may be a patient responsible charge related to this service. The patient expressed understanding and agreed to proceed.   Reason for visit: Follow-up.  HPI: Obesity: Patient has been on Saxenda.  She has slowed down her ramp up and that has been quite beneficial for the nausea.  She notes she will be starting to 3 mg dose next week.  She has only had nausea a couple of times and taken Zofran for it.  Her weight is down 12 pounds.  She has been walking a lot for exercise.  She is eating healthy for the most part though this week she is out of town and has been eating out some.  Thiamine deficiency: Patient increased her thiamine dose to 250 mg daily over-the-counter.  She notes her prior symptoms have improved at home and she feels back to normal.   ROS: See pertinent positives and negatives per HPI.  Past Medical History:  Diagnosis Date  . Arthritis    OA  . Complication of anesthesia    "ITCHING TO FACE, BENADRYL HELP BUT DROPS BLOOD PRESSURE"  . DVT (deep venous  thrombosis) ([place])    following first knee surgery in 2011  . Gastric ulcer   . Headache   . Heterozygous factor V Leiden mutation ([place])   . Hypothyroid   . Iron deficiency anemia     Past Surgical History:  Procedure Laterality Date  . [place]  . CHOLECYSTECTOMY     1998  . COLONOSCOPY WITH PROPOFOL N/A 06/14/2018   Procedure: COLONOSCOPY WITH BIOPSY;  Surgeon: Lucilla Lame, MD;  Location: [facility];  Service: Endoscopy;  Laterality: N/A;  . DILATION AND CURETTAGE OF UTERUS     1994, 2001  . ESOPHAGOGASTRODUODENOSCOPY (EGD) WITH PROPOFOL N/A 06/14/2018   Procedure: ESOPHAGOGASTRODUODENOSCOPY (EGD) WITH PROPOFOL;  Surgeon: Lucilla Lame, MD;  Location: [place];  Service: Endoscopy;  Laterality: N/A;  . FOREIGN BODY REMOVAL N/A 06/14/2018   Procedure: FOREIGN BODY REMOVAL;  Surgeon: Lucilla Lame, MD;  Location: [place];  Service: Endoscopy;  Laterality: N/A;  Staple Removal from Stomach  . GASTRIC BYPASS     2012, revision 2013  . KNEE ARTHROSCOPY     2011, 2013, 2015  . POLYPECTOMY N/A 06/14/2018   Procedure: POLYPECTOMY;  Surgeon: Lucilla Lame, MD;  Location: [place];  Service: Endoscopy;  Laterality: N/A;  . TOOTH EXTRACTION     2010  . TOTAL KNEE ARTHROPLASTY Left 05/25/2015   Procedure: LEFT TOTAL KNEE ARTHROPLASTY;  Surgeon: Renette Butters, MD;  Location: [facility];  Service: Orthopedics;  Laterality: Left;  . TUBAL LIGATION     2004    Family History  Problem Relation Age of Onset  . Arthritis Other        parent  . Hyperlipidemia Other        parent  . Stroke Other        parent  . Hypertension Other        parent  . Kidney disease Other        parent, grandparent  . Diabetes Other        parent, grandparent  . Renal cancer Other        parent  . Breast cancer Maternal Aunt 13       BRACA + dx twice age 66 and 67  . Breast cancer Maternal Grandmother        late 24's    SOCIAL HX:  Non-smoker.   Current Outpatient Medications:  .  Cholecalciferol 50000 units capsule, Take 1 capsule (50,000 Units total) by mouth once a week., Disp: 13 capsule, Rfl: 1 .  diclofenac sodium (VOLTAREN) 1 % GEL, Apply 2 g topically 4 (four) times daily., Disp: , Rfl:  .  EPINEPHrine 0.3 mg/0.3 mL IJ SOAJ injection, Inject into the muscle., Disp: , Rfl:  .  folic acid (FOLVITE) 1 MG tablet, Take 1 tablet (1 mg total) by mouth daily., Disp: 30 tablet, Rfl: 3 .  Liraglutide -Weight Management (SAXENDA) 18 MG/3ML SOPN, Inject 3 mg into the skin daily., Disp: 15 mL, Rfl: 1 .  multivitamin-iron-minerals-folic acid (CENTRUM) chewable tablet, Chew 1 tablet by mouth daily., Disp: , Rfl:  .  nystatin (NYSTATIN) powder, Apply topically 2 (two) times daily. (Patient taking differently: Apply topically 2 (two) times daily as needed. ), Disp: 15 g, Rfl: 0 .  omalizumab (XOLAIR) 150 MG injection, Inject 150 mg into the skin every 28 (twenty-eight) days. , Disp: , Rfl:  .  ondansetron (ZOFRAN) 4 MG tablet, Take 1 tablet (4 mg total) by mouth every 8 (eight) hours as needed for nausea or vomiting., Disp: 20 tablet, Rfl: 0 .  potassium chloride SA (K-DUR) 20 MEQ tablet, Take 1 tablet (20 mEq total) by mouth daily., Disp: 10 tablet, Rfl: 0 .  thiamine 100 MG tablet, Take 1 tablet (100 mg total) by mouth daily., Disp: , Rfl:  .  thyroid (NP THYROID) 120 MG tablet, Take 1 tablet (120 mg total) by mouth daily before breakfast., Disp: 90 tablet, Rfl: 1 .  triamcinolone cream (KENALOG) 0.1 %, Apply 1 application topically 2 (two) times daily. (Patient taking differently: Apply 1 application topically 2 (two) times daily as needed. ), Disp: 30 g, Rfl: 0  EXAM:  VITALS per patient if applicable: None.  GENERAL: alert, oriented, appears well and in no acute distress  HEENT: atraumatic, conjunttiva clear, no obvious abnormalities on inspection of external nose and ears  NECK: normal movements of the head and neck   LUNGS: on inspection no signs of respiratory distress, breathing rate appears normal, no obvious gross SOB, gasping or wheezing  CV: no obvious cyanosis  MS: moves all visible extremities without noticeable abnormality  PSYCH/NEURO: pleasant and cooperative, no obvious depression or anxiety, speech and thought processing grossly intact  ASSESSMENT AND PLAN:  Discussed the following assessment and plan:  Obesity Weight has continued to trend down.  She will continue Saxenda.  She will continue healthy diet and exercise.  Refill of Saxenda sent to pharmacy.  Follow-up in 4 to 6 weeks for  weight check.  Thiamine deficiency Improved symptoms with higher dose of thiamine.  She will continue this.  We will have her come in for labs in 3 weeks.    I discussed the assessment and treatment plan with the patient. The patient was provided an opportunity to ask questions and all were answered. The patient agreed with the plan and demonstrated an understanding of the instructions.   The patient was advised to call back or seek an in-person evaluation if the symptoms worsen or if the condition fails to improve as anticipated.   Tommi Rumps, MD

## 2018-08-30 NOTE — Assessment & Plan Note (Signed)
Weight has continued to trend down.  She will continue Saxenda.  She will continue healthy diet and exercise.  Refill of Saxenda sent to pharmacy.  Follow-up in 4 to 6 weeks for weight check.

## 2018-08-30 NOTE — Assessment & Plan Note (Signed)
Improved symptoms with higher dose of thiamine.  She will continue this.  We will have her come in for labs in 3 weeks.

## 2018-09-17 ENCOUNTER — Telehealth: Payer: Self-pay | Admitting: Family Medicine

## 2018-09-17 NOTE — Telephone Encounter (Signed)
Copied from [place] [id number]. Topic: General - Other >> Sep 17, 2018  1:35 PM Keene Breath wrote: Reason for CRM: Patient called to speak with the nurse or doctor regarding the refill for her medication, Liraglutide -Weight Management ([place]) 18 MG/3ML SOPN.  She stated that the pharmacy needs a new script if the patient wants a 90 supply.  Please call to discuss at [phone]

## 2018-09-18 ENCOUNTER — Encounter: Payer: Self-pay | Admitting: Family Medicine

## 2018-09-19 ENCOUNTER — Telehealth: Payer: Self-pay

## 2018-09-19 MED ORDER — SAXENDA 18 MG/3ML ~~LOC~~ SOPN: 3.0000 mg | PEN_INJECTOR | Freq: Every day | SUBCUTANEOUS | 1 refills | Status: DC

## 2018-09-19 NOTE — Telephone Encounter (Signed)
Taken care of in mychart message

## 2018-09-19 NOTE — Telephone Encounter (Signed)
Copied from [place] [id number]. Topic: General - Other >> Sep 17, 2018  1:35 PM Keene Breath wrote: Reason for CRM: Patient called to speak with the nurse or doctor regarding the refill for her medication, Liraglutide -Weight Management ([place]) 18 MG/3ML SOPN.  She stated that the pharmacy needs a new script if the patient wants a 90 supply.  Please call to discuss at [phone] >> Sep 18, 2018  4:54 PM Yvette Rack wrote: Pt called back stating she still has not received a call back regarding her requests for new Rx for Saxenda. Pt stated she needs someone to return her call. Cb# [phone]

## 2018-09-30 ENCOUNTER — Ambulatory Visit
Admission: RE | Admit: 2018-09-30 | Discharge: 2018-09-30 | Disposition: A | Discharge status: Home / Self Care | Payer: Commercial Managed Care - PPO | Source: Ambulatory Visit | Attending: Family Medicine | Admitting: Family Medicine

## 2018-09-30 ENCOUNTER — Other Ambulatory Visit: Payer: Self-pay

## 2018-09-30 DIAGNOSIS — Z1239 Encounter for other screening for malignant neoplasm of breast: Secondary | ICD-10-CM

## 2018-09-30 DIAGNOSIS — Z1231 Encounter for screening mammogram for malignant neoplasm of breast: Secondary | ICD-10-CM | POA: Diagnosis present

## 2018-10-01 ENCOUNTER — Inpatient Hospital Stay: Payer: Commercial Managed Care - PPO | Attending: Oncology

## 2018-10-01 ENCOUNTER — Other Ambulatory Visit: Payer: Self-pay

## 2018-10-01 DIAGNOSIS — Z86718 Personal history of other venous thrombosis and embolism: Secondary | ICD-10-CM | POA: Diagnosis not present

## 2018-10-01 DIAGNOSIS — E611 Iron deficiency: Secondary | ICD-10-CM | POA: Diagnosis present

## 2018-10-01 DIAGNOSIS — Z791 Long term (current) use of non-steroidal anti-inflammatories (NSAID): Secondary | ICD-10-CM | POA: Diagnosis not present

## 2018-10-01 DIAGNOSIS — Z803 Family history of malignant neoplasm of breast: Secondary | ICD-10-CM | POA: Insufficient documentation

## 2018-10-01 DIAGNOSIS — E538 Deficiency of other specified B group vitamins: Secondary | ICD-10-CM | POA: Insufficient documentation

## 2018-10-01 DIAGNOSIS — Z79899 Other long term (current) drug therapy: Secondary | ICD-10-CM | POA: Insufficient documentation

## 2018-10-01 DIAGNOSIS — R5383 Other fatigue: Secondary | ICD-10-CM | POA: Diagnosis not present

## 2018-10-01 DIAGNOSIS — Z9884 Bariatric surgery status: Secondary | ICD-10-CM | POA: Insufficient documentation

## 2018-10-01 LAB — CBC WITH DIFFERENTIAL/PLATELET
Abs Immature Granulocytes: 0.06 10*3/uL (ref 0.00–0.07)
Basophils Absolute: 0 10*3/uL (ref 0.0–0.1)
Basophils Relative: 0 %
Eosinophils Absolute: 0 10*3/uL (ref 0.0–0.5)
Eosinophils Relative: 0 %
HCT: 38 % (ref 36.0–46.0)
Hemoglobin: 12.7 g/dL (ref 12.0–15.0)
Immature Granulocytes: 1 %
Lymphocytes Relative: 21 %
Lymphs Abs: 2.6 10*3/uL (ref 0.7–4.0)
MCH: 32.6 pg (ref 26.0–34.0)
MCHC: 33.4 g/dL (ref 30.0–36.0)
MCV: 97.4 fL (ref 80.0–100.0)
Monocytes Absolute: 0.9 10*3/uL (ref 0.1–1.0)
Monocytes Relative: 7 %
Neutro Abs: 8.8 10*3/uL — ABNORMAL HIGH (ref 1.7–7.7)
Neutrophils Relative %: 71 %
Platelets: 314 10*3/uL (ref 150–400)
RBC: 3.9 MIL/uL (ref 3.87–5.11)
RDW: 12.7 % (ref 11.5–15.5)
WBC: 12.3 10*3/uL — ABNORMAL HIGH (ref 4.0–10.5)
nRBC: 0 % (ref 0.0–0.2)

## 2018-10-01 LAB — IRON AND TIBC
Iron: 121 ug/dL (ref 28–170)
Saturation Ratios: 42 % — ABNORMAL HIGH (ref 10.4–31.8)
TIBC: 286 ug/dL (ref 250–450)
UIBC: 165 ug/dL

## 2018-10-01 LAB — FOLATE: Folate: 3.4 ng/mL — ABNORMAL LOW (ref >5.9)

## 2018-10-01 LAB — FERRITIN: Ferritin: 159 ng/mL (ref 11–307)

## 2018-10-01 NOTE — Progress Notes (Signed)
Called the patient and checked with her about folic acid. She thought it was one time 30 day supply. I looked at her medication list and she was given 30 day supply 7/13 with 3 refills. So the pt. Only took first 30 days and she did not take any further. She will get a refill done and start back on it. She knows to come to md visit 9/24.

## 2018-10-02 ENCOUNTER — Encounter: Payer: Self-pay | Admitting: Oncology

## 2018-10-02 ENCOUNTER — Other Ambulatory Visit: Payer: Self-pay

## 2018-10-03 ENCOUNTER — Other Ambulatory Visit: Payer: Commercial Managed Care - PPO

## 2018-10-03 ENCOUNTER — Encounter: Payer: Self-pay | Admitting: Oncology

## 2018-10-03 ENCOUNTER — Inpatient Hospital Stay (HOSPITAL_BASED_OUTPATIENT_CLINIC_OR_DEPARTMENT_OTHER): Payer: Commercial Managed Care - PPO | Admitting: Oncology

## 2018-10-03 ENCOUNTER — Other Ambulatory Visit: Payer: Self-pay

## 2018-10-03 VITALS — BP 121/89 | HR 76 | Resp 18 | Wt 221.0 lb

## 2018-10-03 DIAGNOSIS — E611 Iron deficiency: Secondary | ICD-10-CM | POA: Diagnosis not present

## 2018-10-03 DIAGNOSIS — E538 Deficiency of other specified B group vitamins: Secondary | ICD-10-CM | POA: Diagnosis not present

## 2018-10-03 NOTE — Progress Notes (Signed)
Hematology/Oncology Consult note [facility]  Telephone:([phone] [phone]  Patient Care Team: Leone Haven, MD as PCP - General (Family Medicine) Kennith Center, RD as Dietitian (Family Medicine)   Name of the patient: Whitney Hill  [id number]  May 23, 1970   Date of visit: 10/03/18  Diagnosis- iron and folate deficiency  Chief complaint/ Reason for visit- routine f/u of iron deficiency  Heme/Onc history: Patient is a48 year old female with a history of iron deficiency anemia secondary to gastric bypass in 2012. She gets periodic ferraheme. Also has a history of left lower extremity DVT for which she was in 3 months of anticoagulation. Family history positive for factor V Leiden in her mother. He is currently not on long-term anticoagulation. Last Shirlean Kelly was given in January 2018.Patient has been referred by Dr. Caryl Bis for her anemia. 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 Her most recent B1 levels in January 2020 were low at 161. Most recent iron studies from April 2020 showed a low ferritin of 17. CMP at that time was normal. The last CBC that I have for her is from January 2020 when her hemoglobin was 12 and hematocrit 36.7.  Results of blood work from 07/19/2018 were as follows: CBC with differential showed white count of 7.8, H&H of 12.3/36.8 with an MCV of 96.3 and a platelet count of 302.  CMP showed mildly low potassium of 3.1.  Ferritin levels were low at 16.  Iron studies were normal.  B12 level was normal at 344.  Folate was normal at 3.5.  TSH was mildly elevated at 5.9.  Reticulocyte count was low normal at 2.5%.   Interval history- patient tolerated IV iron well but did not feel much difference. She took folate for a month and then stopped  ECOG PS- 1 Pain scale- 0   Review of systems- Review of Systems  Constitutional: Positive for  malaise/fatigue. Negative for chills, fever and weight loss.  HENT: Negative for congestion, ear discharge and nosebleeds.   Eyes: Negative for blurred vision.  Respiratory: Negative for cough, hemoptysis, sputum production, shortness of breath and wheezing.   Cardiovascular: Negative for chest pain, palpitations, orthopnea and claudication.  Gastrointestinal: Negative for abdominal pain, blood in stool, constipation, diarrhea, heartburn, melena, nausea and vomiting.  Genitourinary: Negative for dysuria, flank pain, frequency, hematuria and urgency.  Musculoskeletal: Negative for back pain, joint pain and myalgias.  Skin: Negative for rash.  Neurological: Negative for dizziness, tingling, focal weakness, seizures, weakness and headaches.  Endo/Heme/Allergies: Does not bruise/bleed easily.  Psychiatric/Behavioral: Negative for depression and suicidal ideas. The patient does not have insomnia.        Allergies  Allergen Reactions  . Paxil [Paroxetine Hcl] Anaphylaxis    Throat swelled shut  . Penicillins Hives     Past Medical History:  Diagnosis Date  . Arthritis    OA  . Complication of anesthesia    "ITCHING TO FACE, BENADRYL HELP BUT DROPS BLOOD PRESSURE"  . DVT (deep venous thrombosis) ([place])    following first knee surgery in 2011  . Gastric ulcer   . Headache   . Heterozygous factor V Leiden mutation ([place])   . Hypothyroid   . Iron deficiency anemia      Past Surgical History:  Procedure Laterality Date  . [place]  . CHOLECYSTECTOMY     1998  .  COLONOSCOPY WITH PROPOFOL N/A 06/14/2018   Procedure: COLONOSCOPY WITH BIOPSY;  Surgeon: Lucilla Lame, MD;  Location: [place];  Service: Endoscopy;  Laterality: N/A;  . DILATION AND CURETTAGE OF UTERUS     1994, 2001  . ESOPHAGOGASTRODUODENOSCOPY (EGD) WITH PROPOFOL N/A 06/14/2018   Procedure: ESOPHAGOGASTRODUODENOSCOPY (EGD) WITH PROPOFOL;  Surgeon: Lucilla Lame, MD;  Location: [place];  Service: Endoscopy;  Laterality: N/A;  . FOREIGN BODY REMOVAL N/A 06/14/2018   Procedure: FOREIGN BODY REMOVAL;  Surgeon: Lucilla Lame, MD;  Location: [place];  Service: Endoscopy;  Laterality: N/A;  Staple Removal from Stomach  . GASTRIC BYPASS     2012, revision 2013  . KNEE ARTHROSCOPY     2011, 2013, 2015  . POLYPECTOMY N/A 06/14/2018   Procedure: POLYPECTOMY;  Surgeon: Lucilla Lame, MD;  Location: [place];  Service: Endoscopy;  Laterality: N/A;  . TOOTH EXTRACTION     2010  . TOTAL KNEE ARTHROPLASTY Left 05/25/2015   Procedure: LEFT TOTAL KNEE ARTHROPLASTY;  Surgeon: Renette Butters, MD;  Location: [place];  Service: Orthopedics;  Laterality: Left;  . TUBAL LIGATION     2004    Social History   Socioeconomic History  . Marital status: Married    Spouse name: Not on file  . Number of children: Not on file  . Years of education: Not on file  . Highest education level: Not on file  Occupational History  . Not on file  Social Needs  . Financial resource strain: Not on file  . Food insecurity    Worry: Not on file    Inability: Not on file  . Transportation needs    Medical: Not on file    Non-medical: Not on file  Tobacco Use  . Smoking status: Never Smoker  . Smokeless tobacco: Never Used  Substance and Sexual Activity  . Alcohol use: No    Alcohol/week: 0.0 standard drinks  . Drug use: No  . Sexual activity: Not on file  Lifestyle  . Physical activity    Days per week: Not on file    Minutes per session: Not on file  . Stress: Not on file  Relationships  . Social Herbalist on phone: Not on file    Gets together: Not on file    Attends religious service: Not on file    Active member of club or organization: Not on file    Attends meetings of clubs or organizations: Not on file    Relationship status: Not on file  . Intimate partner violence    Fear of current or ex partner: Not on file    Emotionally abused: Not  on file    Physically abused: Not on file    Forced sexual activity: Not on file  Other Topics Concern  . Not on file  Social History Narrative  . Not on file    Family History  Problem Relation Age of Onset  . Arthritis Other        parent  . Hyperlipidemia Other        parent  . Stroke Other        parent  . Hypertension Other        parent  . Kidney disease Other        parent, grandparent  . Diabetes Other        parent, grandparent  . Renal cancer Other        parent  .  Breast cancer Maternal Aunt 67       BRACA + dx twice age 61 and 62  . Breast cancer Maternal Grandmother        late 50's     Current Outpatient Medications:  .  diclofenac sodium (VOLTAREN) 1 % GEL, Apply 2 g topically 4 (four) times daily., Disp: , Rfl:  .  folic acid (FOLVITE) 1 MG tablet, Take 1 tablet (1 mg total) by mouth daily., Disp: 30 tablet, Rfl: 3 .  Liraglutide -Weight Management (SAXENDA) 18 MG/3ML SOPN, Inject 3 mg into the skin daily., Disp: 45 mL, Rfl: 1 .  multivitamin-iron-minerals-folic acid (CENTRUM) chewable tablet, Chew 1 tablet by mouth daily., Disp: , Rfl:  .  nystatin (NYSTATIN) powder, Apply topically 2 (two) times daily. (Patient taking differently: Apply topically 2 (two) times daily as needed. ), Disp: 15 g, Rfl: 0 .  omalizumab (XOLAIR) 150 MG injection, Inject 150 mg into the skin every 28 (twenty-eight) days. , Disp: , Rfl:  .  ondansetron (ZOFRAN) 4 MG tablet, Take 1 tablet (4 mg total) by mouth every 8 (eight) hours as needed for nausea or vomiting., Disp: 20 tablet, Rfl: 0 .  thiamine 100 MG tablet, Take 1 tablet (100 mg total) by mouth daily., Disp: , Rfl:  .  Cholecalciferol 50000 units capsule, Take 1 capsule (50,000 Units total) by mouth once a week., Disp: 13 capsule, Rfl: 1 .  EPINEPHrine 0.3 mg/0.3 mL IJ SOAJ injection, Inject into the muscle., Disp: , Rfl:  .  potassium chloride SA (K-DUR) 20 MEQ tablet, Take 1 tablet (20 mEq total) by mouth daily. (Patient  not taking: Reported on 10/02/2018), Disp: 10 tablet, Rfl: 0 .  thyroid (NP THYROID) 120 MG tablet, Take 1 tablet (120 mg total) by mouth daily before breakfast. (Patient not taking: Reported on 10/02/2018), Disp: 90 tablet, Rfl: 1 .  triamcinolone cream (KENALOG) 0.1 %, Apply 1 application topically 2 (two) times daily. (Patient not taking: Reported on 10/02/2018), Disp: 30 g, Rfl: 0  Physical exam:  Vitals:   10/03/18 1034  BP: 121/89  Pulse: 76  Resp: 18  Weight: 221 lb (100.2 kg)   Physical Exam HENT:     Head: Normocephalic and atraumatic.  Eyes:     Pupils: Pupils are equal, round, and reactive to light.  Neck:     Musculoskeletal: Normal range of motion.  Cardiovascular:     Rate and Rhythm: Normal rate and regular rhythm.     Heart sounds: Normal heart sounds.  Pulmonary:     Effort: Pulmonary effort is normal.     Breath sounds: Normal breath sounds.  Abdominal:     General: Bowel sounds are normal.     Palpations: Abdomen is soft.  Skin:    General: Skin is warm and dry.  Neurological:     Mental Status: She is alert and oriented to person, place, and time.      CMP Latest Ref Rng & Units 07/19/2018  Glucose 70 - 99 mg/dL 95  BUN 6 - 20 mg/dL 14  Creatinine 0.44 - 1.00 mg/dL 0.59  Sodium 135 - 145 mmol/L 138  Potassium 3.5 - 5.1 mmol/L 3.1(L)  Chloride 98 - 111 mmol/L 107  CO2 22 - 32 mmol/L 25  Calcium 8.9 - 10.3 mg/dL 8.9  Total Protein 6.5 - 8.1 g/dL 6.8  Total Bilirubin 0.3 - 1.2 mg/dL 0.3  Alkaline Phos 38 - 126 U/L 78  AST 15 - 41 U/L 14(L)  ALT 0 - 44 U/L 9   CBC Latest Ref Rng & Units 10/01/2018  WBC 4.0 - 10.5 K/uL 12.3(H)  Hemoglobin 12.0 - 15.0 g/dL 12.7  Hematocrit 36.0 - 46.0 % 38.0  Platelets 150 - 400 K/uL 314    No images are attached to the encounter.  Mm 3d Screen Breast Bilateral  Result Date: 09/30/2018 CLINICAL DATA:  Screening. EXAM: DIGITAL SCREENING BILATERAL MAMMOGRAM WITH TOMO AND CAD COMPARISON:  Previous exam(s). ACR Breast  Density Category b: There are scattered areas of fibroglandular density. FINDINGS: There are no findings suspicious for malignancy. Images were processed with CAD. IMPRESSION: No mammographic evidence of malignancy. A result letter of this screening mammogram will be mailed directly to the patient. RECOMMENDATION: Screening mammogram in one year. (Code:SM-B-01Y) BI-RADS CATEGORY  1: Negative. Electronically Signed   By: Lovey Newcomer M.D.   On: 09/30/2018 13:27     Assessment and plan- Patient is a 48 y.o. female with iron and folate deficiency without anemia  Patients ferritin has improved from 16 to 159. Iron studies are normal. She does not require IV iron at this time. She continues to have low folate. I have asked her to continue folic acid supplements 1mg  daily.   Repeat levels in 3 and 6 months and I will see her back in 6 months   Visit Diagnosis 1. Iron deficiency   2. Folate deficiency      Dr. Randa Evens, MD, MPH [facility] at [facility] [id number] 10/03/2018 3:10 PM

## 2019-01-06 ENCOUNTER — Inpatient Hospital Stay: Payer: Self-pay | Attending: Oncology

## 2019-01-21 ENCOUNTER — Other Ambulatory Visit: Payer: Self-pay | Admitting: Family Medicine

## 2019-01-27 ENCOUNTER — Other Ambulatory Visit: Payer: Self-pay

## 2019-01-29 ENCOUNTER — Ambulatory Visit (INDEPENDENT_AMBULATORY_CARE_PROVIDER_SITE_OTHER): Payer: 59

## 2019-01-29 ENCOUNTER — Other Ambulatory Visit: Payer: Self-pay

## 2019-01-29 DIAGNOSIS — Z23 Encounter for immunization: Secondary | ICD-10-CM | POA: Diagnosis not present

## 2019-03-26 ENCOUNTER — Encounter: Payer: Self-pay | Admitting: *Deleted

## 2019-03-27 ENCOUNTER — Encounter: Payer: Self-pay | Admitting: *Deleted

## 2019-03-27 ENCOUNTER — Inpatient Hospital Stay: Payer: 59 | Attending: Oncology

## 2019-03-27 DIAGNOSIS — Z803 Family history of malignant neoplasm of breast: Secondary | ICD-10-CM | POA: Diagnosis not present

## 2019-03-27 DIAGNOSIS — E039 Hypothyroidism, unspecified: Secondary | ICD-10-CM | POA: Diagnosis not present

## 2019-03-27 DIAGNOSIS — Z8261 Family history of arthritis: Secondary | ICD-10-CM | POA: Insufficient documentation

## 2019-03-27 DIAGNOSIS — Z79899 Other long term (current) drug therapy: Secondary | ICD-10-CM | POA: Insufficient documentation

## 2019-03-27 DIAGNOSIS — Z791 Long term (current) use of non-steroidal anti-inflammatories (NSAID): Secondary | ICD-10-CM | POA: Diagnosis not present

## 2019-03-27 DIAGNOSIS — Z9884 Bariatric surgery status: Secondary | ICD-10-CM | POA: Insufficient documentation

## 2019-03-27 DIAGNOSIS — Z86718 Personal history of other venous thrombosis and embolism: Secondary | ICD-10-CM | POA: Diagnosis not present

## 2019-03-27 DIAGNOSIS — E611 Iron deficiency: Secondary | ICD-10-CM

## 2019-03-27 DIAGNOSIS — Z8249 Family history of ischemic heart disease and other diseases of the circulatory system: Secondary | ICD-10-CM | POA: Insufficient documentation

## 2019-03-27 DIAGNOSIS — D508 Other iron deficiency anemias: Secondary | ICD-10-CM | POA: Insufficient documentation

## 2019-03-27 DIAGNOSIS — Z8349 Family history of other endocrine, nutritional and metabolic diseases: Secondary | ICD-10-CM | POA: Insufficient documentation

## 2019-03-27 DIAGNOSIS — Z833 Family history of diabetes mellitus: Secondary | ICD-10-CM | POA: Diagnosis not present

## 2019-03-27 DIAGNOSIS — E519 Thiamine deficiency, unspecified: Secondary | ICD-10-CM | POA: Diagnosis not present

## 2019-03-27 DIAGNOSIS — Z800 Family history of malignant neoplasm of digestive organs: Secondary | ICD-10-CM | POA: Insufficient documentation

## 2019-03-27 DIAGNOSIS — E538 Deficiency of other specified B group vitamins: Secondary | ICD-10-CM | POA: Insufficient documentation

## 2019-03-27 LAB — IRON AND TIBC
Iron: 77 ug/dL (ref 28–170)
Saturation Ratios: 25 % (ref 10.4–31.8)
TIBC: 307 ug/dL (ref 250–450)
UIBC: 230 ug/dL

## 2019-03-27 LAB — CBC WITH DIFFERENTIAL/PLATELET
Abs Immature Granulocytes: 0.02 10*3/uL (ref 0.00–0.07)
Basophils Absolute: 0 10*3/uL (ref 0.0–0.1)
Basophils Relative: 0 %
Eosinophils Absolute: 0.2 10*3/uL (ref 0.0–0.5)
Eosinophils Relative: 3 %
HCT: 37.9 % (ref 36.0–46.0)
Hemoglobin: 12.8 g/dL (ref 12.0–15.0)
Immature Granulocytes: 0 %
Lymphocytes Relative: 32 %
Lymphs Abs: 2.3 10*3/uL (ref 0.7–4.0)
MCH: 32.7 pg (ref 26.0–34.0)
MCHC: 33.8 g/dL (ref 30.0–36.0)
MCV: 96.7 fL (ref 80.0–100.0)
Monocytes Absolute: 0.6 10*3/uL (ref 0.1–1.0)
Monocytes Relative: 8 %
Neutro Abs: 4.2 10*3/uL (ref 1.7–7.7)
Neutrophils Relative %: 57 %
Platelets: 303 10*3/uL (ref 150–400)
RBC: 3.92 MIL/uL (ref 3.87–5.11)
RDW: 13.1 % (ref 11.5–15.5)
WBC: 7.3 10*3/uL (ref 4.0–10.5)
nRBC: 0 % (ref 0.0–0.2)

## 2019-03-27 LAB — FOLATE: Folate: 3.8 ng/mL — ABNORMAL LOW (ref >5.9)

## 2019-03-27 LAB — FERRITIN: Ferritin: 81 ng/mL (ref 11–307)

## 2019-03-31 ENCOUNTER — Encounter: Payer: Self-pay | Admitting: Oncology

## 2019-03-31 ENCOUNTER — Other Ambulatory Visit: Payer: Self-pay

## 2019-03-31 NOTE — Progress Notes (Signed)
Patient stated that she had been doing well with no concerns. 

## 2019-04-01 ENCOUNTER — Inpatient Hospital Stay: Payer: 59

## 2019-04-01 ENCOUNTER — Inpatient Hospital Stay (HOSPITAL_BASED_OUTPATIENT_CLINIC_OR_DEPARTMENT_OTHER): Payer: 59 | Admitting: Oncology

## 2019-04-01 DIAGNOSIS — E519 Thiamine deficiency, unspecified: Secondary | ICD-10-CM

## 2019-04-01 DIAGNOSIS — D649 Anemia, unspecified: Secondary | ICD-10-CM | POA: Diagnosis not present

## 2019-04-01 DIAGNOSIS — E538 Deficiency of other specified B group vitamins: Secondary | ICD-10-CM | POA: Diagnosis not present

## 2019-04-01 MED ORDER — FOLIC ACID 1 MG PO TABS: 1.0000 mg | ORAL_TABLET | Freq: Every day | ORAL | 3 refills | Status: DC

## 2019-04-03 LAB — VITAMIN B1: Vitamin B1 (Thiamine): 140.6 nmol/L (ref 66.5–200.0)

## 2019-04-03 NOTE — Progress Notes (Signed)
I connected with Whitney Hill on 04/03/19 at 11:15 AM EDT by video enabled telemedicine visit and verified that I am speaking with the correct person using two identifiers.   I discussed the limitations, risks, security and privacy concerns of performing an evaluation and management service by telemedicine and the availability of in-person appointments. I also discussed with the patient that there may be a patient responsible charge related to this service. The patient expressed understanding and agreed to proceed.  Other persons participating in the visit and their role in the encounter:  none  Patient's location:  home Provider's location:  work  Risk analyst Complaint: Routine follow-up of anemia  History of present illness: Patient is a1 year old female with a history of iron deficiency anemia secondary to gastric bypass in 2012. She gets periodic ferraheme. Also has a history of left lower extremity DVT for which she was in 3 months of anticoagulation. Family history positive for factor V Leiden in her mother. He is currently not on long-term anticoagulation. Last Shirlean Kelly was given in January 2018.Patient has been referred by Dr. Caryl Bis for her anemia. 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 Her most recent B1 levels in January 2020 were low at 161. Most recent iron studies from April 2020 showed a low ferritin of 17. CMP at that time was normal. The last CBC that I have for her is from January 2020 when her hemoglobin was 12 and hematocrit 36.7.  Results of blood work from 07/19/2018 were as follows: CBC with differential showed white count of 7.8, H&H of 12.3/36.8 with an MCV of 96.3 and a platelet count of 302. CMP showed mildly low potassium of 3.1. Ferritin levels were low at 16. Iron studies were normal. B12 level was normal at 344. Folate was normal at 3.5. TSH was mildly elevated at 5.9. Reticulocyte count  was low normal at 2.5%.    Interval history currently patient feels well. Denies any specific complaints at this time   Review of Systems  Constitutional: Negative for chills, fever, malaise/fatigue and weight loss.  HENT: Negative for congestion, ear discharge and nosebleeds.   Eyes: Negative for blurred vision.  Respiratory: Negative for cough, hemoptysis, sputum production, shortness of breath and wheezing.   Cardiovascular: Negative for chest pain, palpitations, orthopnea and claudication.  Gastrointestinal: Negative for abdominal pain, blood in stool, constipation, diarrhea, heartburn, melena, nausea and vomiting.  Genitourinary: Negative for dysuria, flank pain, frequency, hematuria and urgency.  Musculoskeletal: Negative for back pain, joint pain and myalgias.  Skin: Negative for rash.  Neurological: Negative for dizziness, tingling, focal weakness, seizures, weakness and headaches.  Endo/Heme/Allergies: Does not bruise/bleed easily.  Psychiatric/Behavioral: Negative for depression and suicidal ideas. The patient does not have insomnia.     Allergies  Allergen Reactions  . Paxil [Paroxetine Hcl] Anaphylaxis    Throat swelled shut  . Penicillins Hives    Past Medical History:  Diagnosis Date  . Arthritis    OA  . Complication of anesthesia    "ITCHING TO FACE, BENADRYL HELP BUT DROPS BLOOD PRESSURE"  . DVT (deep venous thrombosis) ([place])    following first knee surgery in 2011  . Gastric ulcer   . Headache   . Heterozygous factor V Leiden mutation ([place])   . Hypothyroid   . Iron deficiency anemia     Past Surgical History:  Procedure Laterality Date  . [place]  .  CHOLECYSTECTOMY     1998  . COLONOSCOPY WITH PROPOFOL N/A 06/14/2018   Procedure: COLONOSCOPY WITH BIOPSY;  Surgeon: Lucilla Lame, MD;  Location: [place];  Service: Endoscopy;  Laterality: N/A;  . DILATION AND CURETTAGE OF UTERUS     1994, 2001  .  ESOPHAGOGASTRODUODENOSCOPY (EGD) WITH PROPOFOL N/A 06/14/2018   Procedure: ESOPHAGOGASTRODUODENOSCOPY (EGD) WITH PROPOFOL;  Surgeon: Lucilla Lame, MD;  Location: [place];  Service: Endoscopy;  Laterality: N/A;  . FOREIGN BODY REMOVAL N/A 06/14/2018   Procedure: FOREIGN BODY REMOVAL;  Surgeon: Lucilla Lame, MD;  Location: [place];  Service: Endoscopy;  Laterality: N/A;  Staple Removal from Stomach  . GASTRIC BYPASS     2012, revision 2013  . KNEE ARTHROSCOPY     2011, 2013, 2015  . POLYPECTOMY N/A 06/14/2018   Procedure: POLYPECTOMY;  Surgeon: Lucilla Lame, MD;  Location: [place];  Service: Endoscopy;  Laterality: N/A;  . TOOTH EXTRACTION     2010  . TOTAL KNEE ARTHROPLASTY Left 05/25/2015   Procedure: LEFT TOTAL KNEE ARTHROPLASTY;  Surgeon: Renette Butters, MD;  Location: [place];  Service: Orthopedics;  Laterality: Left;  . TUBAL LIGATION     2004    Social History   Socioeconomic History  . Marital status: Married    Spouse name: Not on file  . Number of children: Not on file  . Years of education: Not on file  . Highest education level: Not on file  Occupational History  . Not on file  Tobacco Use  . Smoking status: Never Smoker  . Smokeless tobacco: Never Used  Substance and Sexual Activity  . Alcohol use: No    Alcohol/week: 0.0 standard drinks  . Drug use: No  . Sexual activity: Not on file  Other Topics Concern  . Not on file  Social History Narrative  . Not on file   Social Determinants of Health   Financial Resource Strain:   . Difficulty of Paying Living Expenses:   Food Insecurity:   . Worried About Charity fundraiser in the Last Year:   . Arboriculturist in the Last Year:   Transportation Needs:   . Film/video editor (Medical):   Marland Kitchen Lack of Transportation (Non-Medical):   Physical Activity:   . Days of Exercise per Week:   . Minutes of Exercise per Session:   Stress:   . Feeling of Stress :   Social Connections:   .  Frequency of Communication with Friends and Family:   . Frequency of Social Gatherings with Friends and Family:   . Attends Religious Services:   . Active Member of Clubs or Organizations:   . Attends Archivist Meetings:   Marland Kitchen Marital Status:   Intimate Partner Violence:   . Fear of Current or Ex-Partner:   . Emotionally Abused:   Marland Kitchen Physically Abused:   . Sexually Abused:     Family History  Problem Relation Age of Onset  . Arthritis Other        parent  . Hyperlipidemia Other        parent  . Stroke Other        parent  . Hypertension Other        parent  . Kidney disease Other        parent, grandparent  . Diabetes Other        parent, grandparent  . Renal cancer Other  parent  . Breast cancer Maternal Aunt 28       BRACA + dx twice age 9 and 57  . Breast cancer Maternal Grandmother        late 50's     Current Outpatient Medications:  .  diclofenac sodium (VOLTAREN) 1 % GEL, Apply 2 g topically 4 (four) times daily., Disp: , Rfl:  .  multivitamin-iron-minerals-folic acid (CENTRUM) chewable tablet, Chew 1 tablet by mouth daily., Disp: , Rfl:  .  NP THYROID 120 MG tablet, TAKE 1 TABLET (120 MG TOTAL) BY MOUTH DAILY BEFORE BREAKFAST., Disp: 90 tablet, Rfl: 1 .  ondansetron (ZOFRAN) 4 MG tablet, Take 1 tablet (4 mg total) by mouth every 8 (eight) hours as needed for nausea or vomiting., Disp: 20 tablet, Rfl: 0 .  thiamine 100 MG tablet, Take 1 tablet (100 mg total) by mouth daily., Disp: , Rfl:  .  Cholecalciferol 50000 units capsule, Take 1 capsule (50,000 Units total) by mouth once a week. (Patient not taking: Reported on 03/31/2019), Disp: 13 capsule, Rfl: 1 .  EPINEPHrine 0.3 mg/0.3 mL IJ SOAJ injection, Inject into the muscle., Disp: , Rfl:  .  folic acid (FOLVITE) 1 MG tablet, Take 1 tablet (1 mg total) by mouth daily., Disp: 30 tablet, Rfl: 3 .  Liraglutide -Weight Management (SAXENDA) 18 MG/3ML SOPN, Inject 3 mg into the skin daily. (Patient not  taking: Reported on 03/31/2019), Disp: 45 mL, Rfl: 1 .  nystatin (NYSTATIN) powder, Apply topically 2 (two) times daily. (Patient not taking: Reported on 03/31/2019), Disp: 15 g, Rfl: 0 .  omalizumab (XOLAIR) 150 MG injection, Inject 150 mg into the skin every 28 (twenty-eight) days. , Disp: , Rfl:  .  potassium chloride SA (K-DUR) 20 MEQ tablet, Take 1 tablet (20 mEq total) by mouth daily. (Patient not taking: Reported on 10/02/2018), Disp: 10 tablet, Rfl: 0 .  triamcinolone cream (KENALOG) 0.1 %, Apply 1 application topically 2 (two) times daily. (Patient not taking: Reported on 03/31/2019), Disp: 30 g, Rfl: 0  No results found.  No images are attached to the encounter.   CMP Latest Ref Rng & Units 07/19/2018  Glucose 70 - 99 mg/dL 95  BUN 6 - 20 mg/dL 14  Creatinine 0.44 - 1.00 mg/dL 0.59  Sodium 135 - 145 mmol/L 138  Potassium 3.5 - 5.1 mmol/L 3.1(L)  Chloride 98 - 111 mmol/L 107  CO2 22 - 32 mmol/L 25  Calcium 8.9 - 10.3 mg/dL 8.9  Total Protein 6.5 - 8.1 g/dL 6.8  Total Bilirubin 0.3 - 1.2 mg/dL 0.3  Alkaline Phos 38 - 126 U/L 78  AST 15 - 41 U/L 14(L)  ALT 0 - 44 U/L 9   CBC Latest Ref Rng & Units 03/27/2019  WBC 4.0 - 10.5 K/uL 7.3  Hemoglobin 12.0 - 15.0 g/dL 12.8  Hematocrit 36.0 - 46.0 % 37.9  Platelets 150 - 400 K/uL 303     Observation/objective: Appears in no acute distress over video visit today.  Breathing is nonlabored  Assessment and plan: Patient is a 49 year old female who is here for follow-up of following problems:  1.  Folate deficiency: Patient admits to not taking folic acid consistently.  I have reiterated that her folic acid levels continue to be low and she needs to take 1 mg p.o. daily.  She verbalized understanding  2.  Patient is presently not anemic and her iron studies are normal.  Continue to monitor  3.  B1 deficiency: Being followed by  neurology.  She has received IV B1 in the past.  Currently patient is taking oral B1 and her levels are  normal.  Follow-up instructions: We will repeat CBC, CMP ferritin and iron studies [id number] and folic acid levels in 6 months followed by a video visit  I discussed the assessment and treatment plan with the patient. The patient was provided an opportunity to ask questions and all were answered. The patient agreed with the plan and demonstrated an understanding of the instructions.   The patient was advised to call back or seek an in-person evaluation if the symptoms worsen or if the condition fails to improve as anticipated.    Visit Diagnosis: 1. Normocytic anemia   2. Vitamin B1 deficiency   3. Folate deficiency     Dr. Randa Evens, MD, MPH [facility] Tel- [id number] 04/03/2019 2:13 PM

## 2019-07-24 ENCOUNTER — Telehealth: Payer: Self-pay | Admitting: Family Medicine

## 2019-07-24 NOTE — Telephone Encounter (Signed)
Spoke to [place] at [facility] to verify eligibility for patient. Smitty Pluck recommends calling the insurance directly to submit claim as [email or web address] is not allowing claim to be submitted. The number to the insurance is [phone]

## 2019-07-24 NOTE — Telephone Encounter (Signed)
[employer] called checking  on authorization that was faxed over for Liraglutide -Weight Management ([place]) 18 MG/3ML SOPN for pt Ref key  [place]  Contact number is d [phone]

## 2019-07-25 NOTE — Telephone Encounter (Signed)
Patient has an appt on 08/05/2019 with the provider he is not writing a new RX until the patient is seen, she has not been seen since 08/2018.  ,cma

## 2019-08-05 ENCOUNTER — Encounter: Payer: Self-pay | Admitting: Family Medicine

## 2019-08-05 ENCOUNTER — Other Ambulatory Visit: Payer: Self-pay

## 2019-08-05 ENCOUNTER — Ambulatory Visit: Payer: 59 | Admitting: Family Medicine

## 2019-08-05 ENCOUNTER — Other Ambulatory Visit: Payer: Self-pay | Admitting: Family Medicine

## 2019-08-05 DIAGNOSIS — E039 Hypothyroidism, unspecified: Secondary | ICD-10-CM

## 2019-08-05 DIAGNOSIS — E519 Thiamine deficiency, unspecified: Secondary | ICD-10-CM

## 2019-08-05 DIAGNOSIS — Z6841 Body Mass Index (BMI) 40.0 and over, adult: Secondary | ICD-10-CM

## 2019-08-05 DIAGNOSIS — S76319A Strain of muscle, fascia and tendon of the posterior muscle group at thigh level, unspecified thigh, initial encounter: Secondary | ICD-10-CM | POA: Insufficient documentation

## 2019-08-05 DIAGNOSIS — S76312A Strain of muscle, fascia and tendon of the posterior muscle group at thigh level, left thigh, initial encounter: Secondary | ICD-10-CM

## 2019-08-05 DIAGNOSIS — R809 Proteinuria, unspecified: Secondary | ICD-10-CM

## 2019-08-05 DIAGNOSIS — R351 Nocturia: Secondary | ICD-10-CM | POA: Insufficient documentation

## 2019-08-05 LAB — POCT URINALYSIS DIPSTICK
Bilirubin, UA: NEGATIVE
Blood, UA: NEGATIVE
Glucose, UA: NEGATIVE
Ketones, UA: NEGATIVE
Leukocytes, UA: NEGATIVE
Nitrite, UA: NEGATIVE
Protein, UA: POSITIVE — AB
Spec Grav, UA: 1.02 (ref 1.010–1.025)
Urobilinogen, UA: 0.2 E.U./dL
pH, UA: 6 (ref 5.0–8.0)

## 2019-08-05 LAB — COMPREHENSIVE METABOLIC PANEL
ALT: 32 U/L (ref 0–35)
AST: 34 U/L (ref 0–37)
Albumin: 4.2 g/dL (ref 3.5–5.2)
Alkaline Phosphatase: 108 U/L (ref 39–117)
BUN: 12 mg/dL (ref 6–23)
CO2: 25 mEq/L (ref 19–32)
Calcium: 9.6 mg/dL (ref 8.4–10.5)
Chloride: 107 mEq/L (ref 96–112)
Creatinine, Ser: 0.75 mg/dL (ref 0.40–1.20)
GFR: 81.35 mL/min (ref >60.00)
Glucose, Bld: 98 mg/dL (ref 70–99)
Potassium: 3.5 mEq/L (ref 3.5–5.1)
Sodium: 138 mEq/L (ref 135–145)
Total Bilirubin: 0.4 mg/dL (ref 0.2–1.2)
Total Protein: 6.7 g/dL (ref 6.0–8.3)

## 2019-08-05 LAB — LIPID PANEL
Cholesterol: 210 mg/dL — ABNORMAL HIGH (ref 0–200)
HDL: 42.3 mg/dL
NonHDL: 168.04
Total CHOL/HDL Ratio: 5
Triglycerides: 211 mg/dL — ABNORMAL HIGH (ref 0.0–149.0)
VLDL: 42.2 mg/dL — ABNORMAL HIGH (ref 0.0–40.0)

## 2019-08-05 LAB — HEMOGLOBIN A1C: Hgb A1c MFr Bld: 5.7 % (ref 4.6–6.5)

## 2019-08-05 LAB — TSH: TSH: 5.92 u[IU]/mL — ABNORMAL HIGH (ref 0.35–4.50)

## 2019-08-05 LAB — LDL CHOLESTEROL, DIRECT: Direct LDL: 149 mg/dL

## 2019-08-05 NOTE — Addendum Note (Signed)
Addended by: Leeanne Rio on: 08/05/2019 03:59 PM   Modules accepted: Orders

## 2019-08-05 NOTE — Assessment & Plan Note (Signed)
Check TSH.  Continue NP thyroid for now.  Could consider Synthroid in the future.

## 2019-08-05 NOTE — Assessment & Plan Note (Signed)
Most recent thiamine level was acceptable.  She will continue thiamine supplementation.

## 2019-08-05 NOTE — Assessment & Plan Note (Signed)
Suspect hamstring strain.  This seems to be improving.  She will continue stretching and general activity.

## 2019-08-05 NOTE — Assessment & Plan Note (Signed)
Check labs.  Encouraged continued healthy diet.  Encouraged exercise.  Consider Ozempic for weight loss medication moving forward.

## 2019-08-05 NOTE — Patient Instructions (Signed)
Nice to see you. We will get lab work today and contact you with the results. Please try to stay as active as you can.

## 2019-08-05 NOTE — Progress Notes (Signed)
  Tommi Rumps, MD Phone: [phone]  Whitney Hill is a 50 y.o. female who presents today for f/u.  Obesity: Patient's diet is pretty similar.  She is eating protein and eating small amounts.  Not exercising much.  Previously responded well to [place].  Hypothyroidism: Taking NP thyroid.  Does have chronic dry skin.  No heat or cold intolerance.  Has not tried Synthroid.  She feels like the NP thyroid does not work all that well for her.  Thiamine deficiency: Last thiamine level is acceptable.  Left knee pain: Notes in the posterior aspect of her knee in the muscles.  She felt like there was some muscle tightness.  This has been improving with some stretching and rest.  Nocturia: 2 times per night.  Occasionally smelly urine.  No significant daytime frequency.  She does note a history of urge incontinence in the past though that has improved recently.  She plans to discuss some of this with her GYN who she sees tomorrow.  Social History   Tobacco Use  Smoking Status Never Smoker  Smokeless Tobacco Never Used     ROS see history of present illness  Objective  Physical Exam Vitals:   08/05/19 1159  BP: 115/70  Pulse: 70  Temp: 98.2 F (36.8 C)  SpO2: 98%    BP Readings from Last 3 Encounters:  08/05/19 115/70  10/03/18 121/89  08/02/18 101/65   Wt Readings from Last 3 Encounters:  08/05/19 (!) 242 lb 6.4 oz (110 kg)  10/03/18 221 lb (100.2 kg)  08/30/18 218 lb (98.9 kg)    Physical Exam Constitutional:      General: She is not in acute distress.    Appearance: She is not diaphoretic.  Cardiovascular:     Rate and Rhythm: Normal rate and regular rhythm.     Heart sounds: Normal heart sounds.  Pulmonary:     Effort: Pulmonary effort is normal.     Breath sounds: Normal breath sounds.  Musculoskeletal:     Right lower leg: No edema.     Left lower leg: No edema.  Skin:    General: Skin is warm and dry.  Neurological:     Mental Status: She is  alert.      Assessment/Plan: Please see individual problem list.  Hypothyroidism Check TSH.  Continue NP thyroid for now.  Could consider Synthroid in the future.  Thiamine deficiency Most recent thiamine level was acceptable.  She will continue thiamine supplementation.  Obesity Check labs.  Encouraged continued healthy diet.  Encouraged exercise.  Consider Ozempic for weight loss medication moving forward.  Nocturia Check urinalysis.  Check A1c.  Hamstring strain Suspect hamstring strain.  This seems to be improving.  She will continue stretching and general activity.   Orders Placed This Encounter  Procedures  . Lipid panel  . Comp Met (CMET)  . TSH  . HgB A1c  . POCT Urinalysis Dipstick    No orders of the defined types were placed in this encounter.   This visit occurred during the SARS-CoV-2 public health emergency.  Safety protocols were in place, including screening questions prior to the visit, additional usage of staff PPE, and extensive cleaning of exam room while observing appropriate contact time as indicated for disinfecting solutions.    Tommi Rumps, MD [place]

## 2019-08-05 NOTE — Assessment & Plan Note (Signed)
Check urinalysis.  Check A1c.

## 2019-08-06 LAB — PROTEIN / CREATININE RATIO, URINE
Creatinine, Urine: 155 mg/dL (ref 20–275)
Protein/Creat Ratio: 206 mg/g creat — ABNORMAL HIGH (ref 21–161)
Protein/Creatinine Ratio: 0.206 mg/mg creat — ABNORMAL HIGH (ref 0.021–0.16)
Total Protein, Urine: 32 mg/dL — ABNORMAL HIGH (ref 5–24)

## 2019-08-07 NOTE — Telephone Encounter (Signed)
Nephrology referral and [place] ordered.

## 2019-08-08 ENCOUNTER — Other Ambulatory Visit: Payer: Self-pay | Admitting: Family Medicine

## 2019-08-08 ENCOUNTER — Encounter: Payer: Self-pay | Admitting: Family Medicine

## 2019-08-08 DIAGNOSIS — E039 Hypothyroidism, unspecified: Secondary | ICD-10-CM

## 2019-08-08 MED ORDER — LEVOTHYROXINE SODIUM 150 MCG PO TABS: 150.0000 ug | ORAL_TABLET | Freq: Every day | ORAL | 1 refills | Status: DC

## 2019-08-11 ENCOUNTER — Encounter: Payer: Self-pay | Admitting: Family Medicine

## 2019-08-11 MED ORDER — WEGOVY 0.25 MG/0.5ML ~~LOC~~ SOAJ: 0.2500 mg | SUBCUTANEOUS | 0 refills | Status: AC

## 2019-08-13 ENCOUNTER — Other Ambulatory Visit: Payer: Self-pay

## 2019-08-13 ENCOUNTER — Emergency Department
Admission: EM | Admit: 2019-08-13 | Discharge: 2019-08-13 | Disposition: A | Discharge status: Home / Self Care | Payer: 59 | Attending: Emergency Medicine | Admitting: Emergency Medicine

## 2019-08-13 DIAGNOSIS — Z86718 Personal history of other venous thrombosis and embolism: Secondary | ICD-10-CM | POA: Diagnosis not present

## 2019-08-13 DIAGNOSIS — R2243 Localized swelling, mass and lump, lower limb, bilateral: Secondary | ICD-10-CM | POA: Insufficient documentation

## 2019-08-13 DIAGNOSIS — E039 Hypothyroidism, unspecified: Secondary | ICD-10-CM | POA: Insufficient documentation

## 2019-08-13 DIAGNOSIS — Z96652 Presence of left artificial knee joint: Secondary | ICD-10-CM | POA: Insufficient documentation

## 2019-08-13 DIAGNOSIS — Z79890 Hormone replacement therapy (postmenopausal): Secondary | ICD-10-CM | POA: Diagnosis not present

## 2019-08-13 DIAGNOSIS — M25461 Effusion, right knee: Secondary | ICD-10-CM

## 2019-08-13 LAB — CBC
HCT: 37.9 % (ref 36.0–46.0)
Hemoglobin: 12.4 g/dL (ref 12.0–15.0)
MCH: 31.9 pg (ref 26.0–34.0)
MCHC: 32.7 g/dL (ref 30.0–36.0)
MCV: 97.4 fL (ref 80.0–100.0)
Platelets: 323 10*3/uL (ref 150–400)
RBC: 3.89 MIL/uL (ref 3.87–5.11)
RDW: 13.8 % (ref 11.5–15.5)
WBC: 6.9 10*3/uL (ref 4.0–10.5)
nRBC: 0 % (ref 0.0–0.2)

## 2019-08-13 LAB — URINALYSIS, ROUTINE W REFLEX MICROSCOPIC
Bilirubin Urine: NEGATIVE
Glucose, UA: NEGATIVE mg/dL
Ketones, ur: NEGATIVE mg/dL
Nitrite: NEGATIVE
Protein, ur: NEGATIVE mg/dL
Specific Gravity, Urine: 1.02 (ref 1.005–1.030)
pH: 6 (ref 5.0–8.0)

## 2019-08-13 LAB — BASIC METABOLIC PANEL
Anion gap: 9 (ref 5–15)
BUN: 16 mg/dL (ref 6–20)
CO2: 22 mmol/L (ref 22–32)
Calcium: 9 mg/dL (ref 8.9–10.3)
Chloride: 109 mmol/L (ref 98–111)
Creatinine, Ser: 0.74 mg/dL (ref 0.44–1.00)
GFR calc Af Amer: >60 mL/min (ref >60)
GFR calc non Af Amer: >60 mL/min (ref >60)
Glucose, Bld: 105 mg/dL — ABNORMAL HIGH (ref 70–99)
Potassium: 3.3 mmol/L — ABNORMAL LOW (ref 3.5–5.1)
Sodium: 140 mmol/L (ref 135–145)

## 2019-08-13 NOTE — ED Triage Notes (Signed)
Pt report swelling below the knee, unable to bend knees completely due to swelling. Pt report urine smelling like ammonia 2 weeks ago, seen by PCP and urine sample showed elevated protein in urine, and creatine. Pt also states " I had a metallic taste in my mouth last night, it just concerned me, I just want to make sure my kidneys are okay"  Pt states pcp ordered ultrasound for kidneys on this Friday, but has since developed lower leg swelling. Denies any pain. Pt reports hx of family kidney cancer, and kidney disease. NAD noted at this time.

## 2019-08-13 NOTE — ED Provider Notes (Signed)
[facility] Emergency Department Provider Note   ____________________________________________    I have reviewed the triage vital signs and the nursing notes.   HISTORY  Chief Complaint Leg Swelling     HPI Whitney Hill is a 49 y.o. female who complains of bilateral knee swelling which was most significant last night, slightly improved today.  Patient reports she went on a significant hike on Saturday.  Yesterday she had soreness in her knees and swelling.  Couple of weeks ago she had seen her PCP and was found to have protein in her urine so she is concerned that she may have kidney problems.  She denies shortness of breath or chest pain.  No peripheral edema otherwise.  No history of CHF.  No dysuria  Past Medical History:  Diagnosis Date  . Arthritis    OA  . Complication of anesthesia    "ITCHING TO FACE, BENADRYL HELP BUT DROPS BLOOD PRESSURE"  . DVT (deep venous thrombosis) ([place])    following first knee surgery in 2011  . Gastric ulcer   . Headache   . Heterozygous factor V Leiden mutation ([place])   . Hypothyroid   . Iron deficiency anemia     Patient Active Problem List   Diagnosis Date Noted  . Nocturia 08/05/2019  . Hamstring strain 08/05/2019  . History of abnormal cervical Pap smear 07/09/2018  . Polyp of sigmoid colon   . Menorrhagia with irregular cycle 06/04/2018  . Left arm weakness 02/20/2018  . Left elbow pain 02/05/2018  . Hypokalemia 02/05/2018  . Thiamine deficiency 01/17/2018  . Arthralgia 09/18/2017  . Tick bite 09/18/2017  . Vitamin D deficiency 09/18/2017  . Hashimoto's disease 09/18/2017  . Vision loss 09/18/2017  . Hives 03/13/2017  . Constipation 04/28/2016  . Right ankle pain 04/28/2016  . Abnormal uterine bleeding 02/07/2016  . Iron deficiency 02/04/2016  . Late period 11/24/2015  . Special screening for malignant neoplasms, colon 10/14/2015  . Skin nodule 09/20/2015  . Status post left partial  knee replacement, medial aspect 07/09/2015  . Obesity 07/09/2015  . Knee osteochondritis dessicans 05/25/2015  . Plantar fasciitis 05/13/2015  . Iron deficiency anemia 01/14/2015  . Hypothyroidism 10/01/2014  . Left knee pain 10/01/2014  . Cramps, extremity 10/01/2014  . Allergic rhinitis 10/01/2014  . S/P gastric bypass 10/01/2014    Past Surgical History:  Procedure Laterality Date  . [place]  . CHOLECYSTECTOMY     1998  . COLONOSCOPY WITH PROPOFOL N/A 06/14/2018   Procedure: COLONOSCOPY WITH BIOPSY;  Surgeon: Lucilla Lame, MD;  Location: [place];  Service: Endoscopy;  Laterality: N/A;  . DILATION AND CURETTAGE OF UTERUS     1994, 2001  . ESOPHAGOGASTRODUODENOSCOPY (EGD) WITH PROPOFOL N/A 06/14/2018   Procedure: ESOPHAGOGASTRODUODENOSCOPY (EGD) WITH PROPOFOL;  Surgeon: Lucilla Lame, MD;  Location: [place];  Service: Endoscopy;  Laterality: N/A;  . FOREIGN BODY REMOVAL N/A 06/14/2018   Procedure: FOREIGN BODY REMOVAL;  Surgeon: Lucilla Lame, MD;  Location: [place];  Service: Endoscopy;  Laterality: N/A;  Staple Removal from Stomach  . GASTRIC BYPASS     2012, revision 2013  . KNEE ARTHROSCOPY     2011, 2013, 2015  . POLYPECTOMY N/A 06/14/2018   Procedure: POLYPECTOMY;  Surgeon: Lucilla Lame, MD;  Location: [place];  Service: Endoscopy;  Laterality: N/A;  . TOOTH EXTRACTION     2010  . TOTAL KNEE ARTHROPLASTY  Left 05/25/2015   Procedure: LEFT TOTAL KNEE ARTHROPLASTY;  Surgeon: Renette Butters, MD;  Location: [place];  Service: Orthopedics;  Laterality: Left;  . TUBAL LIGATION     2004    Prior to Admission medications   Medication Sig Start Date End Date Taking? Authorizing Provider  diclofenac sodium (VOLTAREN) 1 % GEL Apply 2 g topically 4 (four) times daily.    [provider]  EPINEPHrine 0.3 mg/0.3 mL IJ SOAJ injection Inject into the muscle. 10/04/16   [provider]  levothyroxine  (SYNTHROID) 150 MCG tablet Take 1 tablet (150 mcg total) by mouth daily before breakfast. 08/08/19   Leone Haven, MD  multivitamin-iron-minerals-folic acid (CENTRUM) chewable tablet Chew 1 tablet by mouth daily.    [provider]  ondansetron (ZOFRAN) 4 MG tablet Take 1 tablet (4 mg total) by mouth every 8 (eight) hours as needed for nausea or vomiting. 08/11/18   Leone Haven, MD  Semaglutide-Weight Management (WEGOVY) 0.25 MG/0.5ML SOAJ Inject 0.25 mg into the skin once a week for 4 doses. 08/11/19 09/02/19  Leone Haven, MD  thiamine 100 MG tablet Take 1 tablet (100 mg total) by mouth daily. 01/23/18   Demetrios Loll, MD     Allergies Paxil [paroxetine hcl] and Penicillins  Family History  Problem Relation Age of Onset  . Arthritis Other        parent  . Hyperlipidemia Other        parent  . Stroke Other        parent  . Hypertension Other        parent  . Kidney disease Other        parent, grandparent  . Diabetes Other        parent, grandparent  . Renal cancer Other        parent  . Breast cancer Maternal Aunt 73       BRACA + dx twice age 41 and 20  . Breast cancer Maternal Grandmother        late 86's    Social History Social History   Tobacco Use  . Smoking status: Never Smoker  . Smokeless tobacco: Never Used  Vaping Use  . Vaping Use: Never used  Substance Use Topics  . Alcohol use: No    Alcohol/week: 0.0 standard drinks  . Drug use: No    Review of Systems  Constitutional: No fever/chills Eyes: No visual changes.  ENT: No sore throat. Cardiovascular: Denies chest pain. Respiratory: Denies shortness of breath. Gastrointestinal: No abdominal pain.   Genitourinary: Negative for dysuria. Musculoskeletal: Negative for back pain. Skin: Negative for rash. Neurological: Negative for headaches or weakness   ____________________________________________   PHYSICAL EXAM:  VITAL SIGNS: ED Triage Vitals  Enc Vitals Group     BP  08/13/19 1104 120/88     Pulse Rate 08/13/19 1104 (!) 59     Resp 08/13/19 1104 18     Temp 08/13/19 1104 98.4 F (36.9 C)     Temp Source 08/13/19 1104 Oral     SpO2 08/13/19 1104 98 %     Weight 08/13/19 1105 108.9 kg (240 lb)     Height --      Head Circumference --      Peak Flow --      Pain Score 08/13/19 1105 0     Pain Loc --      Pain Edu? --      Excl. in [place]? --  Constitutional: Alert and oriented. No acute distress. Pleasant and interactive    Cardiovascular: Normal rate, regular rhythm.   Good peripheral circulation. Respiratory: Normal respiratory effort.  No retractions.   Musculoskeletal: No lower extremity tenderness nor edema.  Warm and well perfused, no calf tenderness or swelling, full range of motion of both knees without gross effusion Neurologic:  Normal speech and language. No gross focal neurologic deficits are appreciated.  Skin:  Skin is warm, dry and intact. No rash noted. Psychiatric: Mood and affect are normal. Speech and behavior are normal.  ____________________________________________   LABS (all labs ordered are listed, but only abnormal results are displayed)  Labs Reviewed  BASIC METABOLIC PANEL - Abnormal; Notable for the following components:      Result Value   Potassium 3.3 (*)    Glucose, Bld 105 (*)    All other components within normal limits  URINALYSIS, ROUTINE W REFLEX MICROSCOPIC - Abnormal; Notable for the following components:   Color, Urine YELLOW (*)    APPearance HAZY (*)    Hgb urine dipstick SMALL (*)    Leukocytes,Ua TRACE (*)    Bacteria, UA RARE (*)    All other components within normal limits  URINE CULTURE  CBC   ____________________________________________  EKG  None ____________________________________________  RADIOLOGY  None ____________________________________________   PROCEDURES  Procedure(s) performed: No  Procedures   Critical Care performed:  No ____________________________________________   INITIAL IMPRESSION / ASSESSMENT AND PLAN / ED COURSE  Pertinent labs & imaging results that were available during my care of the patient were reviewed by me and considered in my medical decision making (see chart for details).  Patient presents with concerns of knee swelling bilaterally.  On exam no peripheral edema distally, no palpable effusions.  Normal range of motion.  No redness or tenderness.  I suspect knee swelling/effusion related to hiking several days ago.  Urinalysis here does not demonstrate protein in the urine, some bacteria and leukocytes, will send for culture as no symptoms.  Lab work is quite reassuring.  Normal sodium.  Appropriate for follow-up with PCP    ____________________________________________   FINAL CLINICAL IMPRESSION(S) / ED DIAGNOSES  Final diagnoses:  Bilateral knee swelling        Note:  This document was prepared using Dragon voice recognition software and may include unintentional dictation errors.   Lavonia Drafts, MD 08/13/19 1250

## 2019-08-13 NOTE — ED Triage Notes (Signed)
First Nurse Note:  Arrives from [facility] for ED evaluation of bilateral leg and knee pain and swelling.    AAOx3.  Skin warm and dry. NAD

## 2019-08-15 ENCOUNTER — Ambulatory Visit
Admission: RE | Admit: 2019-08-15 | Discharge: 2019-08-15 | Disposition: A | Discharge status: Home / Self Care | Payer: 59 | Source: Ambulatory Visit | Attending: Family Medicine | Admitting: Family Medicine

## 2019-08-15 ENCOUNTER — Other Ambulatory Visit: Payer: Self-pay

## 2019-08-15 DIAGNOSIS — R809 Proteinuria, unspecified: Secondary | ICD-10-CM | POA: Diagnosis present

## 2019-08-15 LAB — URINE CULTURE: Culture: >=100000 — AB

## 2019-08-16 ENCOUNTER — Encounter: Payer: Self-pay | Admitting: Family Medicine

## 2019-08-16 DIAGNOSIS — N1330 Unspecified hydronephrosis: Secondary | ICD-10-CM

## 2019-08-21 DIAGNOSIS — M19079 Primary osteoarthritis, unspecified ankle and foot: Secondary | ICD-10-CM | POA: Insufficient documentation

## 2019-08-21 DIAGNOSIS — M1711 Unilateral primary osteoarthritis, right knee: Secondary | ICD-10-CM | POA: Insufficient documentation

## 2019-08-26 ENCOUNTER — Ambulatory Visit: Payer: 59 | Admitting: Family Medicine

## 2019-08-27 ENCOUNTER — Telehealth: Payer: Self-pay | Admitting: Family Medicine

## 2019-08-27 MED ORDER — NITROFURANTOIN MONOHYD MACRO 100 MG PO CAPS
100.0000 mg | ORAL_CAPSULE | Freq: Two times a day (BID) | ORAL | 0 refills | Status: DC
Start: 2019-08-27 — End: 2020-04-23

## 2019-08-27 NOTE — Telephone Encounter (Signed)
Whitney Hill is not going to treat a potential underlying cause. Her urine culture was positive 2 weeks ago though she did not have any symptoms at that time. It would be preferable if she could provide [place] with another urine sample, though I will send in macrobid for her to start on and if not improving she needs a visit to re-evaluate.

## 2019-08-27 NOTE — Telephone Encounter (Signed)
Pt states that she has a UTI and that Dr Caryl Bis is aware and asked her if she needed medication when he called her. She states that she told him no but thinks that now she needs something? Please call pt back to advise. I called the patient and she states she is having severe burning and an ammonia smell, no back pain she wants Peridium.  ,cma

## 2019-08-27 NOTE — Telephone Encounter (Signed)
I called and spoke with the patient and informed her that the provider stated peridium would not help and I informed her that the provider sent in [place] to her pharmacy and if her symptoms does not improve she needed an appointment to evaluate, patient understood.  ,cma

## 2019-08-27 NOTE — Telephone Encounter (Signed)
Pt states that she has a UTI and that Dr Caryl Bis is aware and asked her if she needed medication when he called her. She states that she told him no but thinks that now she needs something? Please call pt back to advise

## 2019-08-29 NOTE — Telephone Encounter (Signed)
Pt wanted a call back about messages below and to come in and do a urine

## 2019-08-29 NOTE — Telephone Encounter (Signed)
Noted please get her scheduled for a point-of-care urinalysis.  Please place an order for this.  Thanks.

## 2019-08-29 NOTE — Telephone Encounter (Signed)
Patinet is still having symptoms and would liek to come in for a urine.  ,cma

## 2019-08-30 ENCOUNTER — Encounter: Payer: Self-pay | Admitting: Family Medicine

## 2019-09-01 ENCOUNTER — Other Ambulatory Visit (INDEPENDENT_AMBULATORY_CARE_PROVIDER_SITE_OTHER): Payer: 59

## 2019-09-01 ENCOUNTER — Ambulatory Visit
Admission: RE | Admit: 2019-09-01 | Discharge: 2019-09-01 | Disposition: A | Discharge status: Home / Self Care | Payer: 59 | Source: Ambulatory Visit | Attending: Family Medicine | Admitting: Family Medicine

## 2019-09-01 ENCOUNTER — Other Ambulatory Visit: Payer: Self-pay | Admitting: Family Medicine

## 2019-09-01 ENCOUNTER — Telehealth: Payer: Self-pay

## 2019-09-01 ENCOUNTER — Other Ambulatory Visit: Payer: Self-pay

## 2019-09-01 DIAGNOSIS — R350 Frequency of micturition: Secondary | ICD-10-CM

## 2019-09-01 DIAGNOSIS — R8290 Unspecified abnormal findings in urine: Secondary | ICD-10-CM

## 2019-09-01 DIAGNOSIS — N1330 Unspecified hydronephrosis: Secondary | ICD-10-CM | POA: Insufficient documentation

## 2019-09-01 LAB — POCT URINALYSIS DIP (MANUAL ENTRY)
Bilirubin, UA: NEGATIVE
Blood, UA: NEGATIVE
Glucose, UA: NEGATIVE mg/dL
Ketones, POC UA: NEGATIVE mg/dL
Nitrite, UA: NEGATIVE
Protein Ur, POC: NEGATIVE mg/dL
Spec Grav, UA: <=1.005 — AB (ref 1.010–1.025)
Urobilinogen, UA: 0.2 E.U./dL
pH, UA: 6 (ref 5.0–8.0)

## 2019-09-01 NOTE — Progress Notes (Signed)
Yes, we will send it for a culture.

## 2019-09-01 NOTE — Telephone Encounter (Signed)
Called and LVM for the patient to call back to schedule a lab appointment for urine POC only.  ,cma

## 2019-09-01 NOTE — Addendum Note (Signed)
Addended by: Tor Netters I on: 09/01/2019 04:53 PM   Modules accepted: Orders

## 2019-09-02 LAB — URINE CULTURE
MICRO NUMBER:: 10859279
SPECIMEN QUALITY:: ADEQUATE

## 2019-09-17 ENCOUNTER — Ambulatory Visit: Payer: 59

## 2019-09-22 ENCOUNTER — Other Ambulatory Visit: Payer: Self-pay

## 2019-09-22 ENCOUNTER — Other Ambulatory Visit (INDEPENDENT_AMBULATORY_CARE_PROVIDER_SITE_OTHER): Payer: 59

## 2019-09-22 DIAGNOSIS — E039 Hypothyroidism, unspecified: Secondary | ICD-10-CM

## 2019-09-22 LAB — TSH: TSH: 5.25 u[IU]/mL — ABNORMAL HIGH (ref 0.35–4.50)

## 2019-09-25 ENCOUNTER — Telehealth: Payer: Self-pay

## 2019-09-25 ENCOUNTER — Encounter: Payer: Self-pay | Admitting: *Deleted

## 2019-09-25 ENCOUNTER — Encounter: Payer: Self-pay | Admitting: Family Medicine

## 2019-09-25 ENCOUNTER — Telehealth: Payer: Self-pay | Admitting: *Deleted

## 2019-09-25 NOTE — Telephone Encounter (Signed)
Patient's husband has been sick and got a positive covid test Monday. He is supposed to be trying to get the infusion done sometime this week. Patient started getting sx this weekend but thought it was just a cold until her husband got a positive test back. She did a test but has not gotten hers back yet. She wants to see if she can get the infusion as well since she is having sx. Does she need to wait until she gets her test back?

## 2019-09-25 NOTE — Telephone Encounter (Signed)
Patient sent mychart message about test results and Dr. Caryl Bis has seen message and spoke with patient. See other note.

## 2019-09-25 NOTE — Telephone Encounter (Signed)
Pt informed me after scheduling her lab appt that her husband tested positive for COVID & she has some mild symptoms (some congestion) & is trying to get in touch with his PCP Dawson Bills) to get him on the infusion list. She also wanted to know if she would qualify for the injection. Please advise.

## 2019-09-25 NOTE — Telephone Encounter (Signed)
Sent pt a mychart message to notify her that she would not qualify for the injection because she has had both of her vaccines. I informed her to continue to self quarantine until she gets her results back & to let us know if her symptoms worsen.

## 2019-09-25 NOTE — Telephone Encounter (Signed)
Left message on patients VM about below; advised her to call back once she gets her test results.

## 2019-09-25 NOTE — Telephone Encounter (Signed)
She will need to wait until her test result comes back. If it is positive she would meet criteria for the antibody infusion. She should contact us as soon as she gets her result.

## 2019-09-26 ENCOUNTER — Other Ambulatory Visit: Payer: Self-pay | Admitting: Oncology

## 2019-09-26 ENCOUNTER — Telehealth: Payer: Self-pay | Admitting: Oncology

## 2019-09-26 ENCOUNTER — Encounter: Payer: Self-pay | Admitting: Oncology

## 2019-09-26 DIAGNOSIS — U071 COVID-19: Secondary | ICD-10-CM

## 2019-09-26 NOTE — Telephone Encounter (Signed)
I called and spoke with with patient and she stated she is fine she only has cold-like symptoms and they are mild.  ,cma

## 2019-09-26 NOTE — Telephone Encounter (Signed)
Please follow-up with the patient to see how she is feeling and see if she got her Covid PCR test back.  Thanks.

## 2019-09-26 NOTE — Telephone Encounter (Signed)
I connected by phone with Whitney Hill to discuss the potential use of an new treatment for mild to moderate COVID-19 viral infection in non-hospitalized patients.   This patient is a age/sex that meets the FDA criteria for Emergency Use Authorization of casirivimab\imdevimab.  Has a (+) direct SARS-CoV-2 viral test result 1. Has mild or moderate COVID-19  2. Is ? 51 years of age and weighs ? 40 kg 3. Is NOT hospitalized due to COVID-19 4. Is NOT requiring oxygen therapy or requiring an increase in baseline oxygen flow rate due to COVID-19 5. Is within 10 days of symptom onset 6. Has at least one of the high risk factor(s) for progression to severe COVID-19 and/or hospitalization as defined in EUA. ? Specific high risk criteria : Past Medical History:  Diagnosis Date  . Arthritis    OA  . Complication of anesthesia    "ITCHING TO FACE, BENADRYL HELP BUT DROPS BLOOD PRESSURE"  . DVT (deep venous thrombosis) ([place])    following first knee surgery in 2011  . Gastric ulcer   . Headache   . Heterozygous factor V Leiden mutation ([place])   . Hypothyroid   . Iron deficiency anemia   ?  ? HIGH Risk- Obesity    Symptom onset  09/21/19   I have spoken and communicated the following to the patient or parent/caregiver:   1. FDA has authorized the emergency use of casirivimab\imdevimab for the treatment of mild to moderate COVID-19 in adults and pediatric patients with positive results of direct SARS-CoV-2 viral testing who are 55 years of age and older weighing at least 40 kg, and who are at high risk for progressing to severe COVID-19 and/or hospitalization.   2. The significant known and potential risks and benefits of casirivimab\imdevimab, and the extent to which such potential risks and benefits are unknown.   3. Information on available alternative treatments and the risks and benefits of those alternatives, including clinical trials.   4. Patients treated with casirivimab\imdevimab should  continue to self-isolate and use infection control measures (e.g., wear mask, isolate, social distance, avoid sharing personal items, clean and disinfect "high touch" surfaces, and frequent handwashing) according to CDC guidelines.    5. The patient or parent/caregiver has the option to accept or refuse casirivimab\imdevimab .  After reviewing this information with the patient, she would like to thin about    The patient agreed to proceed with receiving casirivimab\imdevimab infusion and will be provided a copy of the Fact sheet prior to receiving the infusion.Rulon Abide, AGNP-C [phone] ([place])

## 2019-09-26 NOTE — Progress Notes (Signed)
Orders in.   Faythe Casa, NP 09/26/2019 6:06 PM

## 2019-09-27 ENCOUNTER — Ambulatory Visit (HOSPITAL_COMMUNITY)
Admission: RE | Admit: 2019-09-27 | Discharge: 2019-09-27 | Disposition: A | Discharge status: Home / Self Care | Payer: 59 | Source: Ambulatory Visit | Attending: Pulmonary Disease | Admitting: Pulmonary Disease

## 2019-09-27 DIAGNOSIS — U071 COVID-19: Secondary | ICD-10-CM | POA: Insufficient documentation

## 2019-09-27 MED ORDER — SODIUM CHLORIDE 0.9 % IV SOLN
1200.0000 mg | Freq: Once | INTRAVENOUS | Status: AC
  Administered 2019-09-27: 1200 mg via INTRAVENOUS

## 2019-09-27 MED ORDER — METHYLPREDNISOLONE SODIUM SUCC 125 MG IJ SOLR: 125.0000 mg | Freq: Once | INTRAMUSCULAR | Status: DC | PRN

## 2019-09-27 MED ORDER — FAMOTIDINE IN NACL 20-0.9 MG/50ML-% IV SOLN: 20.0000 mg | Freq: Once | INTRAVENOUS | Status: DC | PRN

## 2019-09-27 MED ORDER — ALBUTEROL SULFATE HFA 108 (90 BASE) MCG/ACT IN AERS: 2.0000 | INHALATION_SPRAY | Freq: Once | RESPIRATORY_TRACT | Status: DC | PRN

## 2019-09-27 MED ORDER — DIPHENHYDRAMINE HCL 50 MG/ML IJ SOLN: 50.0000 mg | Freq: Once | INTRAMUSCULAR | Status: DC | PRN

## 2019-09-27 MED ORDER — SODIUM CHLORIDE 0.9 % IV SOLN: INTRAVENOUS | Status: DC | PRN

## 2019-09-27 MED ORDER — EPINEPHRINE 0.3 MG/0.3ML IJ SOAJ: 0.3000 mg | Freq: Once | INTRAMUSCULAR | Status: DC | PRN

## 2019-09-27 NOTE — Discharge Instructions (Signed)

## 2019-09-27 NOTE — Progress Notes (Signed)
  Diagnosis: COVID-19  Physician: Asencion Noble, MD  Procedure: Covid Infusion Clinic Med: casirivimab\imdevimab infusion - Provided patient with casirivimab\imdevimab fact sheet for patients, parents and caregivers prior to infusion.  Complications: No immediate complications noted.  Discharge: Discharged home   Whitney Hill 09/27/2019

## 2019-10-01 NOTE — Telephone Encounter (Signed)
Pt had an infusion over the weekend

## 2019-10-02 ENCOUNTER — Inpatient Hospital Stay: Payer: 59 | Attending: Oncology

## 2019-10-02 ENCOUNTER — Other Ambulatory Visit: Payer: Self-pay

## 2019-10-02 DIAGNOSIS — Z8349 Family history of other endocrine, nutritional and metabolic diseases: Secondary | ICD-10-CM | POA: Insufficient documentation

## 2019-10-02 DIAGNOSIS — Z791 Long term (current) use of non-steroidal anti-inflammatories (NSAID): Secondary | ICD-10-CM | POA: Insufficient documentation

## 2019-10-02 DIAGNOSIS — Z8249 Family history of ischemic heart disease and other diseases of the circulatory system: Secondary | ICD-10-CM | POA: Diagnosis not present

## 2019-10-02 DIAGNOSIS — D508 Other iron deficiency anemias: Secondary | ICD-10-CM | POA: Insufficient documentation

## 2019-10-02 DIAGNOSIS — Z803 Family history of malignant neoplasm of breast: Secondary | ICD-10-CM | POA: Diagnosis not present

## 2019-10-02 DIAGNOSIS — E039 Hypothyroidism, unspecified: Secondary | ICD-10-CM | POA: Diagnosis not present

## 2019-10-02 DIAGNOSIS — E538 Deficiency of other specified B group vitamins: Secondary | ICD-10-CM | POA: Insufficient documentation

## 2019-10-02 DIAGNOSIS — Z8261 Family history of arthritis: Secondary | ICD-10-CM | POA: Diagnosis not present

## 2019-10-02 DIAGNOSIS — Z79899 Other long term (current) drug therapy: Secondary | ICD-10-CM | POA: Insufficient documentation

## 2019-10-02 DIAGNOSIS — Z800 Family history of malignant neoplasm of digestive organs: Secondary | ICD-10-CM | POA: Diagnosis not present

## 2019-10-02 DIAGNOSIS — D649 Anemia, unspecified: Secondary | ICD-10-CM

## 2019-10-02 DIAGNOSIS — Z86718 Personal history of other venous thrombosis and embolism: Secondary | ICD-10-CM | POA: Diagnosis not present

## 2019-10-02 DIAGNOSIS — Z833 Family history of diabetes mellitus: Secondary | ICD-10-CM | POA: Diagnosis not present

## 2019-10-02 DIAGNOSIS — E519 Thiamine deficiency, unspecified: Secondary | ICD-10-CM | POA: Insufficient documentation

## 2019-10-02 DIAGNOSIS — Z9884 Bariatric surgery status: Secondary | ICD-10-CM | POA: Diagnosis not present

## 2019-10-02 LAB — COMPREHENSIVE METABOLIC PANEL
ALT: 22 U/L (ref 0–44)
AST: 26 U/L (ref 15–41)
Albumin: 4.1 g/dL (ref 3.5–5.0)
Alkaline Phosphatase: 98 U/L (ref 38–126)
Anion gap: 6 (ref 5–15)
BUN: 18 mg/dL (ref 6–20)
CO2: 25 mmol/L (ref 22–32)
Calcium: 8.9 mg/dL (ref 8.9–10.3)
Chloride: 108 mmol/L (ref 98–111)
Creatinine, Ser: 0.99 mg/dL (ref 0.44–1.00)
GFR calc Af Amer: >60 mL/min (ref >60)
GFR calc non Af Amer: >60 mL/min (ref >60)
Glucose, Bld: 91 mg/dL (ref 70–99)
Potassium: 3.2 mmol/L — ABNORMAL LOW (ref 3.5–5.1)
Sodium: 139 mmol/L (ref 135–145)
Total Bilirubin: 0.4 mg/dL (ref 0.3–1.2)
Total Protein: 7.5 g/dL (ref 6.5–8.1)

## 2019-10-02 LAB — CBC WITH DIFFERENTIAL/PLATELET
Abs Immature Granulocytes: 0.04 10*3/uL (ref 0.00–0.07)
Basophils Absolute: 0.1 10*3/uL (ref 0.0–0.1)
Basophils Relative: 1 %
Eosinophils Absolute: 0.1 10*3/uL (ref 0.0–0.5)
Eosinophils Relative: 2 %
HCT: 40 % (ref 36.0–46.0)
Hemoglobin: 13.8 g/dL (ref 12.0–15.0)
Immature Granulocytes: 1 %
Lymphocytes Relative: 34 %
Lymphs Abs: 2.9 10*3/uL (ref 0.7–4.0)
MCH: 32.8 pg (ref 26.0–34.0)
MCHC: 34.5 g/dL (ref 30.0–36.0)
MCV: 95 fL (ref 80.0–100.0)
Monocytes Absolute: 0.7 10*3/uL (ref 0.1–1.0)
Monocytes Relative: 8 %
Neutro Abs: 4.7 10*3/uL (ref 1.7–7.7)
Neutrophils Relative %: 54 %
Platelets: 324 10*3/uL (ref 150–400)
RBC: 4.21 MIL/uL (ref 3.87–5.11)
RDW: 13.2 % (ref 11.5–15.5)
WBC: 8.5 10*3/uL (ref 4.0–10.5)
nRBC: 0 % (ref 0.0–0.2)

## 2019-10-02 LAB — FERRITIN: Ferritin: 85 ng/mL (ref 11–307)

## 2019-10-02 LAB — IRON AND TIBC
Iron: 95 ug/dL (ref 28–170)
Saturation Ratios: 23 % (ref 10.4–31.8)
TIBC: 417 ug/dL (ref 250–450)
UIBC: 322 ug/dL

## 2019-10-02 LAB — FOLATE: Folate: 4.3 ng/mL — ABNORMAL LOW (ref >5.9)

## 2019-10-03 ENCOUNTER — Telehealth: Payer: 59 | Admitting: Oncology

## 2019-10-04 ENCOUNTER — Other Ambulatory Visit: Payer: Self-pay | Admitting: Family Medicine

## 2019-10-04 DIAGNOSIS — E039 Hypothyroidism, unspecified: Secondary | ICD-10-CM

## 2019-10-04 MED ORDER — LEVOTHYROXINE SODIUM 175 MCG PO TABS: 175.0000 ug | ORAL_TABLET | Freq: Every day | ORAL | 1 refills | Status: DC

## 2019-10-05 LAB — VITAMIN B1: Vitamin B1 (Thiamine): 138.4 nmol/L (ref 66.5–200.0)

## 2019-10-06 ENCOUNTER — Encounter: Payer: Self-pay | Admitting: *Deleted

## 2019-10-06 ENCOUNTER — Inpatient Hospital Stay (HOSPITAL_BASED_OUTPATIENT_CLINIC_OR_DEPARTMENT_OTHER): Payer: 59 | Admitting: Oncology

## 2019-10-06 DIAGNOSIS — E538 Deficiency of other specified B group vitamins: Secondary | ICD-10-CM | POA: Diagnosis not present

## 2019-10-06 DIAGNOSIS — E519 Thiamine deficiency, unspecified: Secondary | ICD-10-CM | POA: Diagnosis not present

## 2019-10-06 DIAGNOSIS — D649 Anemia, unspecified: Secondary | ICD-10-CM

## 2019-10-06 MED ORDER — FOLIC ACID 1 MG PO TABS: 1.0000 mg | ORAL_TABLET | Freq: Every day | ORAL | 3 refills | Status: DC

## 2019-10-06 NOTE — Progress Notes (Signed)
Pt  would like to know if she is an candidate for the covid booster.

## 2019-10-07 ENCOUNTER — Other Ambulatory Visit: Payer: Self-pay | Admitting: Oncology

## 2019-10-09 NOTE — Progress Notes (Signed)
I connected with Whitney Hill on 10/09/19 at  2:15 PM EDT by video enabled telemedicine visit and verified that I am speaking with the correct person using two identifiers.   I discussed the limitations, risks, security and privacy concerns of performing an evaluation and management service by telemedicine and the availability of in-person appointments. I also discussed with the patient that there may be a patient responsible charge related to this service. The patient expressed understanding and agreed to proceed.  Other persons participating in the visit and their role in the encounter:  none  Patient's location:  home Provider's location:  work  Risk analyst Complaint:  Routine f/u of anemia and folate deficiency  History of present illness:  Patient is a49 year old female with a history of iron deficiency anemia secondary to gastric bypass in 2012. She gets periodic ferraheme. Also has a history of left lower extremity DVT for which she was in 3 months of anticoagulation. Family history positive for factor V Leiden in her mother. He is currently not on long-term anticoagulation. Last Shirlean Kelly was given in January 2018.Patient has been referred by Dr. Caryl Bis for her anemia. 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 Her most recent B1 levels in January 2020 were low at 161. Most recent iron studies from April 2020 showed a low ferritin of 17. CMP at that time was normal. The last CBC that I have for her is from January 2020 when her hemoglobin was 12 and hematocrit 36.7.  Results of blood work from 07/19/2018 were as follows: CBC with differential showed white count of 7.8, H&H of 12.3/36.8 with an MCV of 96.3 and a platelet count of 302. CMP showed mildly low potassium of 3.1. Ferritin levels were low at 16. Iron studies were normal. B12 level was normal at 344. Folate was normal at 3.5. TSH was mildly elevated at 5.9.  Reticulocyte count was low normal at 2.5%.   Interval history: she had recent covid exposure through her husband. She received mab prophylaxis but tested negative for covid   Review of Systems  Constitutional: Positive for malaise/fatigue. Negative for chills, fever and weight loss.  HENT: Negative for congestion, ear discharge and nosebleeds.   Eyes: Negative for blurred vision.  Respiratory: Negative for cough, hemoptysis, sputum production, shortness of breath and wheezing.   Cardiovascular: Negative for chest pain, palpitations, orthopnea and claudication.  Gastrointestinal: Negative for abdominal pain, blood in stool, constipation, diarrhea, heartburn, melena, nausea and vomiting.  Genitourinary: Negative for dysuria, flank pain, frequency, hematuria and urgency.  Musculoskeletal: Negative for back pain, joint pain and myalgias.  Skin: Negative for rash.  Neurological: Negative for dizziness, tingling, focal weakness, seizures, weakness and headaches.  Endo/Heme/Allergies: Does not bruise/bleed easily.  Psychiatric/Behavioral: Negative for depression and suicidal ideas. The patient does not have insomnia.     Allergies  Allergen Reactions  . Paxil [Paroxetine Hcl] Anaphylaxis    Throat swelled shut  . Penicillins Hives    Past Medical History:  Diagnosis Date  . Arthritis    OA  . Complication of anesthesia    "ITCHING TO FACE, BENADRYL HELP BUT DROPS BLOOD PRESSURE"  . DVT (deep venous thrombosis) ([place])    following first knee surgery in 2011  . Gastric ulcer   . Headache   . Heterozygous factor V Leiden mutation ([place])   . Hypothyroid   . Iron deficiency anemia     Past Surgical History:  Procedure  Laterality Date  . [place]  . CHOLECYSTECTOMY     1998  . COLONOSCOPY WITH PROPOFOL N/A 06/14/2018   Procedure: COLONOSCOPY WITH BIOPSY;  Surgeon: Lucilla Lame, MD;  Location: [facility];  Service: Endoscopy;  Laterality: N/A;   . DILATION AND CURETTAGE OF UTERUS     1994, 2001  . ESOPHAGOGASTRODUODENOSCOPY (EGD) WITH PROPOFOL N/A 06/14/2018   Procedure: ESOPHAGOGASTRODUODENOSCOPY (EGD) WITH PROPOFOL;  Surgeon: Lucilla Lame, MD;  Location: [facility];  Service: Endoscopy;  Laterality: N/A;  . FOREIGN BODY REMOVAL N/A 06/14/2018   Procedure: FOREIGN BODY REMOVAL;  Surgeon: Lucilla Lame, MD;  Location: [facility];  Service: Endoscopy;  Laterality: N/A;  Staple Removal from Stomach  . GASTRIC BYPASS     2012, revision 2013  . KNEE ARTHROSCOPY     2011, 2013, 2015  . POLYPECTOMY N/A 06/14/2018   Procedure: POLYPECTOMY;  Surgeon: Lucilla Lame, MD;  Location: [facility];  Service: Endoscopy;  Laterality: N/A;  . TOOTH EXTRACTION     2010  . TOTAL KNEE ARTHROPLASTY Left 05/25/2015   Procedure: LEFT TOTAL KNEE ARTHROPLASTY;  Surgeon: Renette Butters, MD;  Location: [facility];  Service: Orthopedics;  Laterality: Left;  . TUBAL LIGATION     2004    Social History   Socioeconomic History  . Marital status: Married    Spouse name: Not on file  . Number of children: Not on file  . Years of education: Not on file  . Highest education level: Not on file  Occupational History  . Not on file  Tobacco Use  . Smoking status: Never Smoker  . Smokeless tobacco: Never Used  Vaping Use  . Vaping Use: Never used  Substance and Sexual Activity  . Alcohol use: No    Alcohol/week: 0.0 standard drinks  . Drug use: No  . Sexual activity: Not on file  Other Topics Concern  . Not on file  Social History Narrative  . Not on file   Social Determinants of Health   Financial Resource Strain:   . Difficulty of Paying Living Expenses: Not on file  Food Insecurity:   . Worried About Charity fundraiser in the Last Year: Not on file  . Ran Out of Food in the Last Year: Not on file  Transportation Needs:   . Lack of Transportation (Medical): Not on file  . Lack of Transportation (Non-Medical): Not on file   Physical Activity:   . Days of Exercise per Week: Not on file  . Minutes of Exercise per Session: Not on file  Stress:   . Feeling of Stress : Not on file  Social Connections:   . Frequency of Communication with Friends and Family: Not on file  . Frequency of Social Gatherings with Friends and Family: Not on file  . Attends Religious Services: Not on file  . Active Member of Clubs or Organizations: Not on file  . Attends Archivist Meetings: Not on file  . Marital Status: Not on file  Intimate Partner Violence:   . Fear of Current or Ex-Partner: Not on file  . Emotionally Abused: Not on file  . Physically Abused: Not on file  . Sexually Abused: Not on file    Family History  Problem Relation Age of Onset  . Arthritis Other        parent  . Hyperlipidemia Other        parent  .  Stroke Other        parent  . Hypertension Other        parent  . Kidney disease Other        parent, grandparent  . Diabetes Other        parent, grandparent  . Renal cancer Other        parent  . Breast cancer Maternal Aunt 66       BRACA + dx twice age 16 and 74  . Breast cancer Maternal Grandmother        late 50's     Current Outpatient Medications:  .  diclofenac sodium (VOLTAREN) 1 % GEL, Apply 2 g topically 4 (four) times daily., Disp: , Rfl:  .  EPINEPHrine 0.3 mg/0.3 mL IJ SOAJ injection, Inject into the muscle., Disp: , Rfl:  .  levothyroxine (SYNTHROID) 175 MCG tablet, Take 1 tablet (175 mcg total) by mouth daily before breakfast. (Patient taking differently: Take 150 mcg by mouth daily before breakfast. ), Disp: 90 tablet, Rfl: 1 .  multivitamin-iron-minerals-folic acid (CENTRUM) chewable tablet, Chew 1 tablet by mouth daily., Disp: , Rfl:  .  nitrofurantoin, macrocrystal-monohydrate, (MACROBID) 100 MG capsule, Take 1 capsule (100 mg total) by mouth 2 (two) times daily., Disp: 10 capsule, Rfl: 0 .  ondansetron (ZOFRAN) 4 MG tablet, Take 1 tablet (4 mg total) by mouth  every 8 (eight) hours as needed for nausea or vomiting., Disp: 20 tablet, Rfl: 0 .  thiamine 100 MG tablet, Take 1 tablet (100 mg total) by mouth daily., Disp: , Rfl:  .  folic acid (FOLVITE) 1 MG tablet, Take 1 tablet (1 mg total) by mouth daily., Disp: 30 tablet, Rfl: 3  No results found.  No images are attached to the encounter.   CMP Latest Ref Rng & Units 10/02/2019  Glucose 70 - 99 mg/dL 91  BUN 6 - 20 mg/dL 18  Creatinine 0.44 - 1.00 mg/dL 0.99  Sodium 135 - 145 mmol/L 139  Potassium 3.5 - 5.1 mmol/L 3.2(L)  Chloride 98 - 111 mmol/L 108  CO2 22 - 32 mmol/L 25  Calcium 8.9 - 10.3 mg/dL 8.9  Total Protein 6.5 - 8.1 g/dL 7.5  Total Bilirubin 0.3 - 1.2 mg/dL 0.4  Alkaline Phos 38 - 126 U/L 98  AST 15 - 41 U/L 26  ALT 0 - 44 U/L 22   CBC Latest Ref Rng & Units 10/02/2019  WBC 4.0 - 10.5 K/uL 8.5  Hemoglobin 12.0 - 15.0 g/dL 13.8  Hematocrit 36 - 46 % 40.0  Platelets 150 - 400 K/uL 324     Observation/objective: appears in no acute distress over video visit today. Breathing non labored.   Assessment and plan: Patient is a 49 yr old female here for f/u of folate and B1 deficiency:  1. Folate deficiency: levels are low. She has not taken her folate supplements diligently. I have refilled her prescription.  2. B1 deficiency: levels are presently normal.   Patient is not presently anemic. I will see her in 6 months with cbc with diff/ b1/ folate levels  Follow-up instructions:  I discussed the assessment and treatment plan with the patient. The patient was provided an opportunity to ask questions and all were answered. The patient agreed with the plan and demonstrated an understanding of the instructions.   The patient was advised to call back or seek an in-person evaluation if the symptoms worsen or if the condition fails to improve as anticipated.  Visit Diagnosis:  1. Normocytic anemia   2. Folate deficiency   3. Vitamin B1 deficiency     Dr. Randa Evens, MD,  [facility] at [facility] Tel- [phone] 10/09/2019 6:24 PM

## 2019-10-11 NOTE — Telephone Encounter (Signed)
Lab done. ,cma

## 2019-11-07 ENCOUNTER — Ambulatory Visit: Payer: 59 | Admitting: Family Medicine

## 2019-11-10 ENCOUNTER — Other Ambulatory Visit: Payer: 59

## 2019-11-13 ENCOUNTER — Telehealth: Payer: Self-pay | Admitting: Family Medicine

## 2019-11-13 NOTE — Telephone Encounter (Signed)
Rejection Reason - Patient was No Show - Pt Cancelled 2x and has not called back to reschedule since Sept 2021" Jerome said on Nov 13, 2019 3:04 PM  "9/22: Pt cx'd appt sched for today, this is the second appt she cancels." [employer] said on Oct 01, 2019 1:18 PM

## 2019-11-14 NOTE — Telephone Encounter (Signed)
Noted. Please follow-up with the patient to see why she canceled with nephrology and see if she is still willing to see them. I would recommend she see them for the protein in her urine. Thanks.

## 2019-11-14 NOTE — Telephone Encounter (Signed)
LVM for the patient to call back. ,cma  

## 2019-11-17 NOTE — Telephone Encounter (Signed)
Noted  

## 2019-11-17 NOTE — Telephone Encounter (Signed)
I called and spoke with the patient she stated she will reschedule she has been out of town with her father for 2 weeks and she may have to go back.  But she will call and schedule later.  ,cma

## 2020-01-10 ENCOUNTER — Encounter: Payer: Self-pay | Admitting: Family Medicine

## 2020-01-16 ENCOUNTER — Other Ambulatory Visit: Payer: 59

## 2020-01-19 ENCOUNTER — Encounter: Payer: Self-pay | Admitting: Family Medicine

## 2020-01-19 ENCOUNTER — Other Ambulatory Visit: Payer: Self-pay

## 2020-01-19 ENCOUNTER — Telehealth (INDEPENDENT_AMBULATORY_CARE_PROVIDER_SITE_OTHER): Payer: 59 | Admitting: Family Medicine

## 2020-01-19 DIAGNOSIS — U071 COVID-19: Secondary | ICD-10-CM | POA: Diagnosis not present

## 2020-01-19 DIAGNOSIS — N939 Abnormal uterine and vaginal bleeding, unspecified: Secondary | ICD-10-CM

## 2020-01-19 DIAGNOSIS — M25562 Pain in left knee: Secondary | ICD-10-CM

## 2020-01-19 DIAGNOSIS — M25571 Pain in right ankle and joints of right foot: Secondary | ICD-10-CM

## 2020-01-19 DIAGNOSIS — R809 Proteinuria, unspecified: Secondary | ICD-10-CM | POA: Insufficient documentation

## 2020-01-19 DIAGNOSIS — G8929 Other chronic pain: Secondary | ICD-10-CM

## 2020-01-19 MED ORDER — FLUTICASONE PROPIONATE 50 MCG/ACT NA SUSP: 2.0000 | Freq: Every day | NASAL | 6 refills | Status: DC

## 2020-01-19 NOTE — Assessment & Plan Note (Signed)
Improved at this time.  She will monitor.  We are referring to a new orthopedist and she could discuss this with them if this recurs.

## 2020-01-19 NOTE — Assessment & Plan Note (Signed)
Patient reports clicking and sticking in this knee.  We will refer her to a new orthopedist for further evaluation.

## 2020-01-19 NOTE — Assessment & Plan Note (Addendum)
Patient is recovering from an acute COVID-19 infection.  She continues to have fatigue and some cough and some diarrhea.  I discussed that sometimes it takes weeks or months for symptoms to completely resolve.  Discussed symptomatic management.  We will trial Flonase for the postnasal drip.  If she develops any worsening symptoms or if her symptoms do not start to improve over the next several weeks she will let us know right away.  I did discuss that the patient could get her booster when she starts to feel like she is getting back to normal.  I also discussed that she could wait up to 90 days prior to getting her booster.  We discussed the monoclonal antibody infusion and that the supply is very limited at this time.  The patient would not meet criteria for this.

## 2020-01-19 NOTE — Progress Notes (Signed)
Virtual Visit via video Note  This visit type was conducted due to national recommendations for restrictions regarding the COVID-19 pandemic (e.g. social distancing).  This format is felt to be most appropriate for this patient at this time.  All issues noted in this document were discussed and addressed.  No physical exam was performed (except for noted visual exam findings with Video Visits).   I connected with Karin Lieu today at  9:00 AM EST by a video enabled telemedicine application or and verified that I am speaking with the correct person using two identifiers. Location patient: home Location provider: work Persons participating in the virtual visit: patient, provider  I discussed the limitations, risks, security and privacy concerns of performing an evaluation and management service by telephone and the availability of in person appointments. I also discussed with the patient that there may be a patient responsible charge related to this service. The patient expressed understanding and agreed to proceed.  Reason for visit: Same-day visit  HPI: [id number]: Patient notes she got sick on 01/03/2020.  She was not able to get tested until the Wednesday after that.  She developed cough which would come in spells that were fairly significant.  She had sinus and nasal congestion.  She had sore throat.  Her ears felt full.  She noted some issues with diarrhea.  She had no body aches or shortness of breath.  She had significant fatigue.  She continues to have fatigue and some intermittent cough.  Some occasional diarrhea though last had this 2 days ago.  She is vaccinated though has not had her booster vaccine.  Her daughter was sick prior to her though her daughter tested negative.  They both got sick after going to [place] to visit the patient's father who was sick.  The patient has been using Tessalon and ipratropium nasal spray.  Proteinuria: Patient notes possible prior history of this when  she was still living in [place].  She notes they typically would give her antibiotics and noted they typically found this on presurgical clearance.  She has not seen nephrology yet given issues with her husband having Covid and then her having to go back and forth to [place].  Knee pain/ankle pain: These have been ongoing issues.  Started out with right knee pain and was seen at orthopedics and given oral steroids as well as an injection.  She notes that improved though then she developed bad discomfort in her right ankle and notes they were unable to do an injection at that time.  She notes her right knee and ankle pain have improved at this time.  Her left knee has been bothering her.  She had a knee replacement there and notes occasional clicking and feeling as though her knee gets stuck.  She would like to see a different orthopedist.  Abnormal uterine bleeding: Patient has followed with gynecology in the past for this.  She had a endometrial biopsy which revealed benign findings.  She had an ultrasound which revealed a fibroid and a polyp.  She last saw gynecology in September and as she had no additional bleeding she notes they deferred any further treatment or evaluation.  She notes subsequently that month she had an additional menstrual cycle though she has not informed gynecology.   ROS: See pertinent positives and negatives per HPI.  Past Medical History:  Diagnosis Date  . Arthritis    OA  . Complication of anesthesia    "ITCHING TO FACE, BENADRYL HELP  BUT DROPS BLOOD PRESSURE"  . DVT (deep venous thrombosis) ([place])    following first knee surgery in 2011  . Gastric ulcer   . Headache   . Heterozygous factor V Leiden mutation ([place])   . Hypothyroid   . Iron deficiency anemia     Past Surgical History:  Procedure Laterality Date  . [place]  . CHOLECYSTECTOMY     1998  . COLONOSCOPY WITH PROPOFOL N/A 06/14/2018   Procedure: COLONOSCOPY WITH BIOPSY;   Surgeon: Lucilla Lame, MD;  Location: [facility];  Service: Endoscopy;  Laterality: N/A;  . DILATION AND CURETTAGE OF UTERUS     1994, 2001  . ESOPHAGOGASTRODUODENOSCOPY (EGD) WITH PROPOFOL N/A 06/14/2018   Procedure: ESOPHAGOGASTRODUODENOSCOPY (EGD) WITH PROPOFOL;  Surgeon: Lucilla Lame, MD;  Location: [facility];  Service: Endoscopy;  Laterality: N/A;  . FOREIGN BODY REMOVAL N/A 06/14/2018   Procedure: FOREIGN BODY REMOVAL;  Surgeon: Lucilla Lame, MD;  Location: [facility];  Service: Endoscopy;  Laterality: N/A;  Staple Removal from Stomach  . GASTRIC BYPASS     2012, revision 2013  . KNEE ARTHROSCOPY     2011, 2013, 2015  . POLYPECTOMY N/A 06/14/2018   Procedure: POLYPECTOMY;  Surgeon: Lucilla Lame, MD;  Location: [facility];  Service: Endoscopy;  Laterality: N/A;  . TOOTH EXTRACTION     2010  . TOTAL KNEE ARTHROPLASTY Left 05/25/2015   Procedure: LEFT TOTAL KNEE ARTHROPLASTY;  Surgeon: Renette Butters, MD;  Location: [facility];  Service: Orthopedics;  Laterality: Left;  . TUBAL LIGATION     2004    Family History  Problem Relation Age of Onset  . Arthritis Other        parent  . Hyperlipidemia Other        parent  . Stroke Other        parent  . Hypertension Other        parent  . Kidney disease Other        parent, grandparent  . Diabetes Other        parent, grandparent  . Renal cancer Other        parent  . Breast cancer Maternal Aunt 47       BRACA + dx twice age 69 and 26  . Breast cancer Maternal Grandmother        late 65's    SOCIAL HX: nonsmoker   Current Outpatient Medications:  .  diclofenac sodium (VOLTAREN) 1 % GEL, Apply 2 g topically 4 (four) times daily., Disp: , Rfl:  .  EPINEPHrine 0.3 mg/0.3 mL IJ SOAJ injection, Inject into the muscle., Disp: , Rfl:  .  folic acid (FOLVITE) 1 MG tablet, Take 1 tablet (1 mg total) by mouth daily., Disp: 30 tablet, Rfl: 3 .  levothyroxine (SYNTHROID) 175 MCG tablet, Take 1 tablet (175  mcg total) by mouth daily before breakfast. (Patient taking differently: Take 150 mcg by mouth daily before breakfast.), Disp: 90 tablet, Rfl: 1 .  multivitamin-iron-minerals-folic acid (CENTRUM) chewable tablet, Chew 1 tablet by mouth daily., Disp: , Rfl:  .  thiamine 100 MG tablet, Take 1 tablet (100 mg total) by mouth daily., Disp: , Rfl:  .  fluticasone (FLONASE) 50 MCG/ACT nasal spray, Place 2 sprays into both nostrils daily., Disp: 16 g, Rfl: 6 .  nitrofurantoin, macrocrystal-monohydrate, (MACROBID) 100 MG capsule, Take 1 capsule (100 mg total) by mouth 2 (two) times daily., Disp: 10 capsule,  Rfl: 0 .  ondansetron (ZOFRAN) 4 MG tablet, Take 1 tablet (4 mg total) by mouth every 8 (eight) hours as needed for nausea or vomiting., Disp: 20 tablet, Rfl: 0  EXAM:  VITALS per patient if applicable:  GENERAL: alert, oriented, appears well and in no acute distress  HEENT: atraumatic, conjunttiva clear, no obvious abnormalities on inspection of external nose and ears  NECK: normal movements of the head and neck  LUNGS: on inspection no signs of respiratory distress, breathing rate appears normal, no obvious gross SOB, gasping or wheezing  CV: no obvious cyanosis  MS: moves all visible extremities without noticeable abnormality  PSYCH/NEURO: pleasant and cooperative, no obvious depression or anxiety, speech and thought processing grossly intact  ASSESSMENT AND PLAN:  Discussed the following assessment and plan:  Problem List Items Addressed This Visit    Abnormal uterine bleeding    I encouraged the patient to contact gynecology to let them know about this additional bleeding episode.  Discussed that they may want to complete further work-up or a repeat endometrial biopsy.      COVID-19    Patient is recovering from an acute COVID-19 infection.  She continues to have fatigue and some cough and some diarrhea.  I discussed that sometimes it takes weeks or months for symptoms to completely  resolve.  Discussed symptomatic management.  We will trial Flonase for the postnasal drip.  If she develops any worsening symptoms or if her symptoms do not start to improve over the next several weeks she will let us know right away.  I did discuss that the patient could get her booster when she starts to feel like she is getting back to normal.  I also discussed that she could wait up to 90 days prior to getting her booster.  We discussed the monoclonal antibody infusion and that the supply is very limited at this time.  The patient would not meet criteria for this.      Relevant Medications   fluticasone (FLONASE) 50 MCG/ACT nasal spray   Left knee pain    Patient reports clicking and sticking in this knee.  We will refer her to a new orthopedist for further evaluation.      Relevant Orders   Ambulatory referral to Orthopedic Surgery   Proteinuria    I encouraged the patient to schedule her evaluation with nephrology.  Discussed that they would likely do additional testing and if everything checked out okay they may only see her the 1 time for they may continue to follow her if something is abnormal.  Her initial ultrasound did reveal some mild left hydronephrosis though her follow-up CT scan was unremarkable.      Right ankle pain    Improved at this time.  She will monitor.  We are referring to a new orthopedist and she could discuss this with them if this recurs.          I discussed the assessment and treatment plan with the patient. The patient was provided an opportunity to ask questions and all were answered. The patient agreed with the plan and demonstrated an understanding of the instructions.   The patient was advised to call back or seek an in-person evaluation if the symptoms worsen or if the condition fails to improve as anticipated.  I have spent 42 minutes in the care of this patient regarding history taking, discussion of COVID-19, discussion of plan for other medical  problems, placing orders, documentation.   Randall Hiss  Caryl Bis, MD

## 2020-01-19 NOTE — Assessment & Plan Note (Signed)
I encouraged the patient to contact gynecology to let them know about this additional bleeding episode.  Discussed that they may want to complete further work-up or a repeat endometrial biopsy.

## 2020-01-19 NOTE — Assessment & Plan Note (Signed)
I encouraged the patient to schedule her evaluation with nephrology.  Discussed that they would likely do additional testing and if everything checked out okay they may only see her the 1 time for they may continue to follow her if something is abnormal.  Her initial ultrasound did reveal some mild left hydronephrosis though her follow-up CT scan was unremarkable.

## 2020-02-23 DIAGNOSIS — M222X1 Patellofemoral disorders, right knee: Secondary | ICD-10-CM | POA: Insufficient documentation

## 2020-04-05 ENCOUNTER — Inpatient Hospital Stay: Payer: 59 | Admitting: Oncology

## 2020-04-05 ENCOUNTER — Inpatient Hospital Stay: Payer: 59

## 2020-04-07 ENCOUNTER — Inpatient Hospital Stay: Payer: 59

## 2020-04-08 ENCOUNTER — Other Ambulatory Visit: Payer: Self-pay

## 2020-04-08 ENCOUNTER — Inpatient Hospital Stay: Payer: 59 | Attending: Oncology

## 2020-04-08 DIAGNOSIS — D649 Anemia, unspecified: Secondary | ICD-10-CM | POA: Insufficient documentation

## 2020-04-08 DIAGNOSIS — E519 Thiamine deficiency, unspecified: Secondary | ICD-10-CM | POA: Insufficient documentation

## 2020-04-08 LAB — COMPREHENSIVE METABOLIC PANEL
ALT: 11 U/L (ref 0–44)
AST: 15 U/L (ref 15–41)
Albumin: 4.1 g/dL (ref 3.5–5.0)
Alkaline Phosphatase: 89 U/L (ref 38–126)
Anion gap: 9 (ref 5–15)
BUN: 18 mg/dL (ref 6–20)
CO2: 25 mmol/L (ref 22–32)
Calcium: 9.1 mg/dL (ref 8.9–10.3)
Chloride: 104 mmol/L (ref 98–111)
Creatinine, Ser: 0.9 mg/dL (ref 0.44–1.00)
GFR, Estimated: >60 mL/min (ref >60)
Glucose, Bld: 95 mg/dL (ref 70–99)
Potassium: 3.7 mmol/L (ref 3.5–5.1)
Sodium: 138 mmol/L (ref 135–145)
Total Bilirubin: 0.5 mg/dL (ref 0.3–1.2)
Total Protein: 7.1 g/dL (ref 6.5–8.1)

## 2020-04-08 LAB — CBC WITH DIFFERENTIAL/PLATELET
Abs Immature Granulocytes: 0.03 10*3/uL (ref 0.00–0.07)
Basophils Absolute: 0 10*3/uL (ref 0.0–0.1)
Basophils Relative: 0 %
Eosinophils Absolute: 0.1 10*3/uL (ref 0.0–0.5)
Eosinophils Relative: 1 %
HCT: 41.3 % (ref 36.0–46.0)
Hemoglobin: 13.6 g/dL (ref 12.0–15.0)
Immature Granulocytes: 0 %
Lymphocytes Relative: 32 %
Lymphs Abs: 2.3 10*3/uL (ref 0.7–4.0)
MCH: 32.2 pg (ref 26.0–34.0)
MCHC: 32.9 g/dL (ref 30.0–36.0)
MCV: 97.6 fL (ref 80.0–100.0)
Monocytes Absolute: 0.5 10*3/uL (ref 0.1–1.0)
Monocytes Relative: 7 %
Neutro Abs: 4.3 10*3/uL (ref 1.7–7.7)
Neutrophils Relative %: 60 %
Platelets: 308 10*3/uL (ref 150–400)
RBC: 4.23 MIL/uL (ref 3.87–5.11)
RDW: 13.1 % (ref 11.5–15.5)
WBC: 7.2 10*3/uL (ref 4.0–10.5)
nRBC: 0 % (ref 0.0–0.2)

## 2020-04-08 LAB — FERRITIN: Ferritin: 74 ng/mL (ref 11–307)

## 2020-04-08 LAB — IRON AND TIBC
Iron: 90 ug/dL (ref 28–170)
Saturation Ratios: 25 % (ref 10.4–31.8)
TIBC: 356 ug/dL (ref 250–450)
UIBC: 266 ug/dL

## 2020-04-08 LAB — FOLATE: Folate: 5.3 ng/mL — ABNORMAL LOW (ref >5.9)

## 2020-04-13 ENCOUNTER — Inpatient Hospital Stay: Payer: 59 | Admitting: Oncology

## 2020-04-13 ENCOUNTER — Ambulatory Visit: Payer: 59 | Admitting: Oncology

## 2020-04-20 LAB — VITAMIN B1: Vitamin B1 (Thiamine): 121.7 nmol/L (ref 66.5–200.0)

## 2020-04-23 ENCOUNTER — Other Ambulatory Visit: Payer: Self-pay

## 2020-04-23 ENCOUNTER — Encounter: Payer: Self-pay | Admitting: Oncology

## 2020-04-23 ENCOUNTER — Inpatient Hospital Stay: Payer: 59 | Attending: Oncology | Admitting: Oncology

## 2020-04-23 VITALS — BP 96/80 | HR 83 | Temp 99.5°F | Resp 16 | Wt 241.0 lb

## 2020-04-23 DIAGNOSIS — E538 Deficiency of other specified B group vitamins: Secondary | ICD-10-CM | POA: Insufficient documentation

## 2020-04-23 DIAGNOSIS — Z862 Personal history of diseases of the blood and blood-forming organs and certain disorders involving the immune mechanism: Secondary | ICD-10-CM | POA: Insufficient documentation

## 2020-04-23 DIAGNOSIS — Z803 Family history of malignant neoplasm of breast: Secondary | ICD-10-CM | POA: Diagnosis not present

## 2020-04-23 DIAGNOSIS — Z86718 Personal history of other venous thrombosis and embolism: Secondary | ICD-10-CM | POA: Diagnosis not present

## 2020-04-23 DIAGNOSIS — E519 Thiamine deficiency, unspecified: Secondary | ICD-10-CM | POA: Diagnosis not present

## 2020-04-23 NOTE — Progress Notes (Signed)
Hematology/Oncology Consult note [facility]  [phone] [phone]  Patient Care Team: Leone Haven, MD as PCP - General (Family Medicine) Kennith Center, RD as Dietitian (Family Medicine)   Name of the patient: Whitney Hill  [id number]  1970-11-22   Date of visit: 04/23/20  Diagnosis-folate deficiency  Chief complaint/ Reason for visit-routine follow-up of folate deficiency  Heme/Onc history: Patient is a50 year old female with a history of iron deficiency anemia secondary to gastric bypass in 2012. She gets periodic ferraheme. Also has a history of left lower extremity DVT for which she was in 3 months of anticoagulation. Family history positive for factor V Leiden in her mother. He is currently not on long-term anticoagulation. Last Shirlean Kelly was given in January 2018.Patient has been referred by Dr. Caryl Bis for her anemia. 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 Her most recent B1 levels in January 2020 were low at 161. Most recent iron studies from April 2020 showed a low ferritin of 17. CMP at that time was normal. The last CBC that I have for her is from January 2020 when her hemoglobin was 12 and hematocrit 36.7.  Results of blood work from 07/19/2018 were as follows: CBC with differential showed white count of 7.8, H&H of 12.3/36.8 with an MCV of 96.3 and a platelet count of 302. CMP showed mildly low potassium of 3.1. Ferritin levels were low at 16. Iron studies were normal. B12 level was normal at 344. Folate was normal at 3.5. TSH was mildly elevated at 5.9. Reticulocyte count was low normal at 2.5%.    Interval history-patient recovered from Covid in December 2021.  She reports still having some ongoing fatigue and occasional exertional shortness of breath and does not feel like she is completely back to her baseline after recovering  from Covid.  She does not take her folic acid supplements consistently  ECOG PS- 1 Pain scale- 0   Review of systems- Review of Systems  Constitutional: Positive for malaise/fatigue. Negative for chills, fever and weight loss.  HENT: Negative for congestion, ear discharge and nosebleeds.   Eyes: Negative for blurred vision.  Respiratory: Negative for cough, hemoptysis, sputum production, shortness of breath and wheezing.   Cardiovascular: Negative for chest pain, palpitations, orthopnea and claudication.  Gastrointestinal: Negative for abdominal pain, blood in stool, constipation, diarrhea, heartburn, melena, nausea and vomiting.  Genitourinary: Negative for dysuria, flank pain, frequency, hematuria and urgency.  Musculoskeletal: Negative for back pain, joint pain and myalgias.  Skin: Negative for rash.  Neurological: Negative for dizziness, tingling, focal weakness, seizures, weakness and headaches.  Endo/Heme/Allergies: Does not bruise/bleed easily.  Psychiatric/Behavioral: Negative for depression and suicidal ideas. The patient does not have insomnia.      Allergies  Allergen Reactions  . Paxil [Paroxetine Hcl] Anaphylaxis    Throat swelled shut  . Penicillins Hives     Past Medical History:  Diagnosis Date  . Arthritis    OA  . Complication of anesthesia    "ITCHING TO FACE, BENADRYL HELP BUT DROPS BLOOD PRESSURE"  . DVT (deep venous thrombosis) ([place])    following first knee surgery in 2011  . Gastric ulcer   . Headache   . Heterozygous factor V Leiden mutation ([place])   . Hypothyroid   . Iron deficiency anemia      Past Surgical History:  Procedure Laterality Date  . ANKLE FRACTURE SURGERY  1994, 1996  . CHOLECYSTECTOMY     1998  . COLONOSCOPY WITH PROPOFOL N/A 06/14/2018   Procedure: COLONOSCOPY WITH BIOPSY;  Surgeon: Lucilla Lame, MD;  Location: [place];  Service: Endoscopy;  Laterality: N/A;  . DILATION AND CURETTAGE OF UTERUS     1994, 2001   . ESOPHAGOGASTRODUODENOSCOPY (EGD) WITH PROPOFOL N/A 06/14/2018   Procedure: ESOPHAGOGASTRODUODENOSCOPY (EGD) WITH PROPOFOL;  Surgeon: Lucilla Lame, MD;  Location: [place];  Service: Endoscopy;  Laterality: N/A;  . FOREIGN BODY REMOVAL N/A 06/14/2018   Procedure: FOREIGN BODY REMOVAL;  Surgeon: Lucilla Lame, MD;  Location: [place];  Service: Endoscopy;  Laterality: N/A;  Staple Removal from Stomach  . GASTRIC BYPASS     2012, revision 2013  . KNEE ARTHROSCOPY     2011, 2013, 2015  . POLYPECTOMY N/A 06/14/2018   Procedure: POLYPECTOMY;  Surgeon: Lucilla Lame, MD;  Location: [place];  Service: Endoscopy;  Laterality: N/A;  . TOOTH EXTRACTION     2010  . TOTAL KNEE ARTHROPLASTY Left 05/25/2015   Procedure: LEFT TOTAL KNEE ARTHROPLASTY;  Surgeon: Renette Butters, MD;  Location: [place];  Service: Orthopedics;  Laterality: Left;  . TUBAL LIGATION     2004    Social History   Socioeconomic History  . Marital status: Married    Spouse name: Not on file  . Number of children: Not on file  . Years of education: Not on file  . Highest education level: Not on file  Occupational History  . Not on file  Tobacco Use  . Smoking status: Never Smoker  . Smokeless tobacco: Never Used  Vaping Use  . Vaping Use: Never used  Substance and Sexual Activity  . Alcohol use: No    Alcohol/week: 0.0 standard drinks  . Drug use: No  . Sexual activity: Not on file  Other Topics Concern  . Not on file  Social History Narrative  . Not on file   Social Determinants of Health   Financial Resource Strain: Not on file  Food Insecurity: Not on file  Transportation Needs: Not on file  Physical Activity: Not on file  Stress: Not on file  Social Connections: Not on file  Intimate Partner Violence: Not on file    Family History  Problem Relation Age of Onset  . Arthritis Other        parent  . Hyperlipidemia Other        parent  . Stroke Other        parent  .  Hypertension Other        parent  . Kidney disease Other        parent, grandparent  . Diabetes Other        parent, grandparent  . Renal cancer Other        parent  . Breast cancer Maternal Aunt 51       BRACA + dx twice age 83 and 26  . Breast cancer Maternal Grandmother        late 50's     Current Outpatient Medications:  .  EPINEPHrine 0.3 mg/0.3 mL IJ SOAJ injection, Inject into the muscle., Disp: , Rfl:  .  folic acid (FOLVITE) 1 MG tablet, Take 1 tablet (1 mg total) by mouth daily., Disp: 30 tablet, Rfl: 3 .  levothyroxine (SYNTHROID) 175 MCG tablet, Take 1 tablet (175 mcg total) by mouth daily before breakfast. (Patient taking differently: Take 150 mcg by mouth daily before breakfast.),  Disp: 90 tablet, Rfl: 1 .  multivitamin-iron-minerals-folic acid (CENTRUM) chewable tablet, Chew 1 tablet by mouth daily., Disp: , Rfl:  .  OZEMPIC, 1 MG/DOSE, 4 MG/3ML SOPN, Inject 1 mg into the skin once a week., Disp: , Rfl:  .  diclofenac sodium (VOLTAREN) 1 % GEL, Apply 2 g topically 4 (four) times daily. (Patient not taking: Reported on 04/23/2020), Disp: , Rfl:  .  fluticasone (FLONASE) 50 MCG/ACT nasal spray, Place 2 sprays into both nostrils daily. (Patient not taking: Reported on 04/23/2020), Disp: 16 g, Rfl: 6 .  thiamine 100 MG tablet, Take 1 tablet (100 mg total) by mouth daily. (Patient not taking: Reported on 04/23/2020), Disp: , Rfl:   Physical exam:  Vitals:   04/23/20 1019  BP: 96/80  Pulse: 83  Resp: 16  Temp: 99.5 F (37.5 C)  TempSrc: Tympanic  SpO2: 99%  Weight: 241 lb (109.3 kg)   Physical Exam Constitutional:      General: She is not in acute distress. Cardiovascular:     Rate and Rhythm: Normal rate and regular rhythm.     Heart sounds: Normal heart sounds.  Pulmonary:     Effort: Pulmonary effort is normal.     Breath sounds: Normal breath sounds.  Abdominal:     General: Bowel sounds are normal.     Palpations: Abdomen is soft.  Skin:    General: Skin  is warm and dry.  Neurological:     Mental Status: She is alert and oriented to person, place, and time.      CMP Latest Ref Rng & Units 04/08/2020  Glucose 70 - 99 mg/dL 95  BUN 6 - 20 mg/dL 18  Creatinine 0.44 - 1.00 mg/dL 0.90  Sodium 135 - 145 mmol/L 138  Potassium 3.5 - 5.1 mmol/L 3.7  Chloride 98 - 111 mmol/L 104  CO2 22 - 32 mmol/L 25  Calcium 8.9 - 10.3 mg/dL 9.1  Total Protein 6.5 - 8.1 g/dL 7.1  Total Bilirubin 0.3 - 1.2 mg/dL 0.5  Alkaline Phos 38 - 126 U/L 89  AST 15 - 41 U/L 15  ALT 0 - 44 U/L 11   CBC Latest Ref Rng & Units 04/08/2020  WBC 4.0 - 10.5 K/uL 7.2  Hemoglobin 12.0 - 15.0 g/dL 13.6  Hematocrit 36.0 - 46.0 % 41.3  Platelets 150 - 400 K/uL 308      Assessment and plan- Patient is a 50 y.o. female with history of B12 folate and B1 deficiency here for routine follow-up  Folate deficiency: Levels continue to be low but improving as compared to prior values.  I have encouraged the patient to take her folate supplements consistently.  Repeat levels in 6 months.  She is not presently anemic  B1 levels are presently normal.  Repeat CBC B1 B12 and folate levels in 6 months and I will see her thereafter   Visit Diagnosis 1. Folate deficiency   2. Vitamin B1 deficiency      Dr. Randa Evens, MD, MPH [facility] at [facility] [phone] 04/23/2020 1:05 PM

## 2020-05-24 ENCOUNTER — Other Ambulatory Visit: Payer: Self-pay | Admitting: Oncology

## 2020-07-05 IMAGING — MR MR CERVICAL SPINE W/O CM
5 series · 39 of 48 positions shown · non-contrast
Comparison: Comparison made with previous brain MRI from
01/17/2018.

CLINICAL DATA: Initial evaluation for continued left upper
extremity weakness.

EXAM:
MRI CERVICAL SPINE WITHOUT CONTRAST
TECHNIQUE: Multiplanar, multisequence MR imaging of the cervical spine was
performed. No intravenous contrast was administered.

[Series 5: T2 · sagittal · 3.0mm · 0.62mm/px · 6 of 15 slices shown (1 of 2)]
[im 1/15]
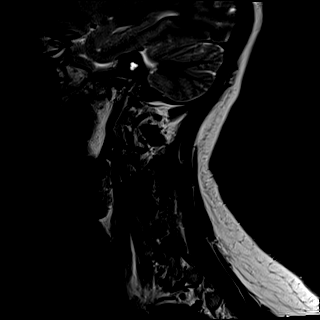
[im 3/15]
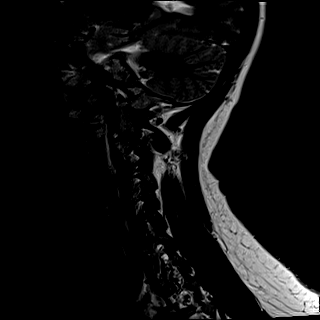
[im 6/15]
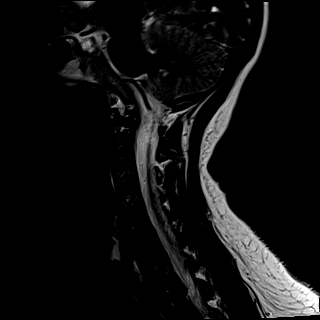
[im 9/15]
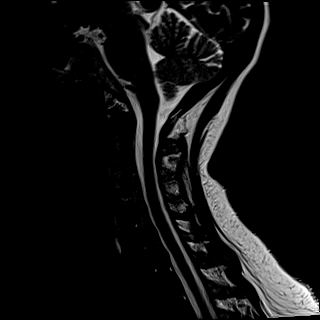
[im 12/15]
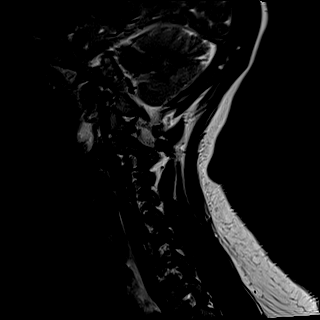
[im 15/15]
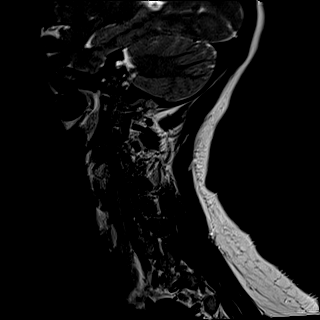

[Series 6: FLAIR · sagittal · 3.0mm · 0.78mm/px · 7 of 15 slices shown]
[im 1/15]
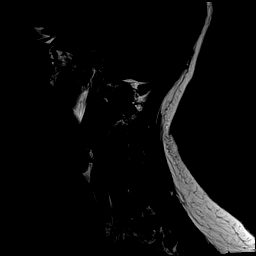
[im 3/15]
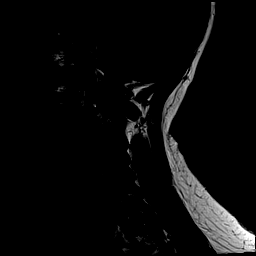
[im 5/15]
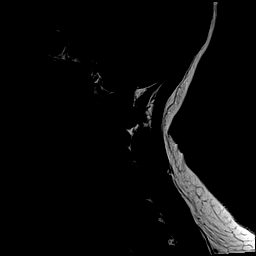
[im 8/15]
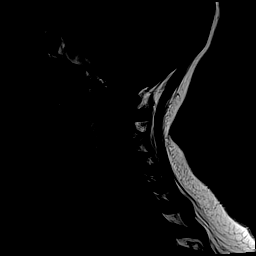
[im 10/15]
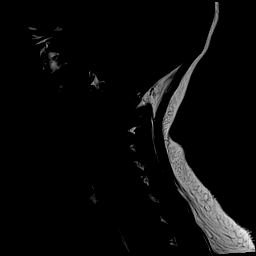
[im 12/15]
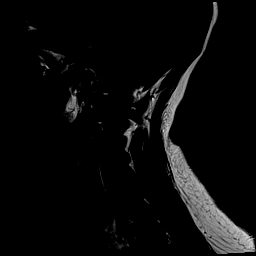
[im 15/15]
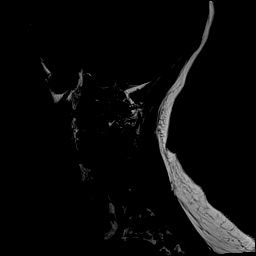

[Series 7: STIR · sagittal · 3.0mm · 0.62mm/px · 7 of 15 slices shown]
[im 1/15]
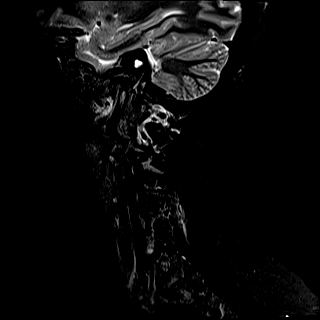
[im 3/15]
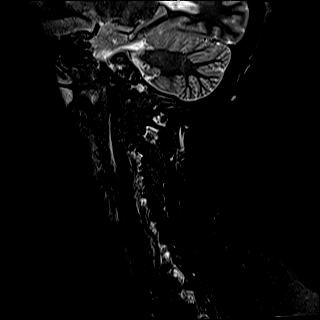
[im 5/15]
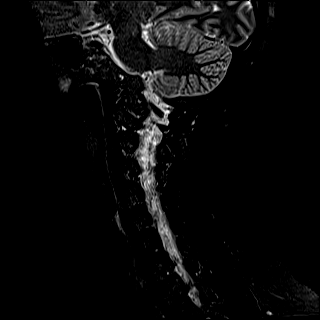
[im 8/15]
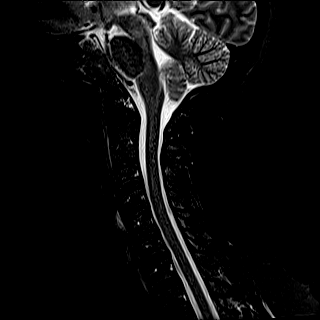
[im 10/15]
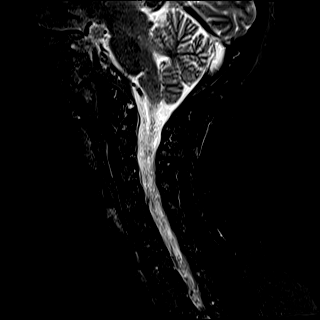
[im 12/15]
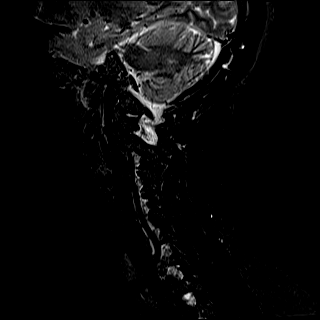
[im 15/15]
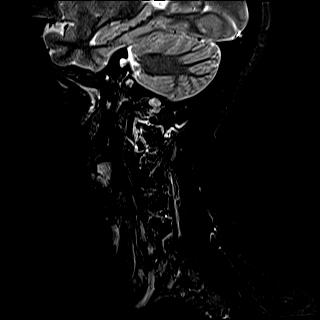

[Series 8: T2 · axial · 3.0mm · 0.70mm/px · z∈[-53,+36]mm · 11 of 29 slices shown (2 of 2)]
[im 1/29]
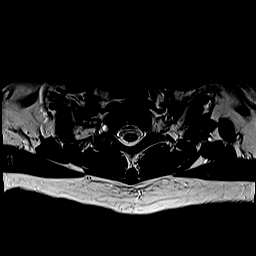
[im 3/29]
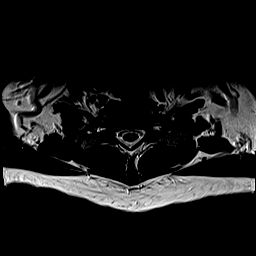
[im 5/29]
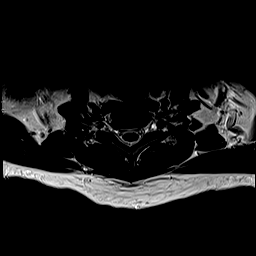
[im 7/29]
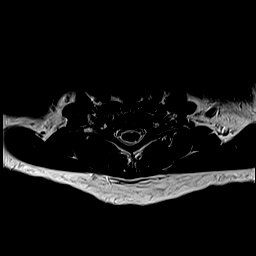
[im 9/29]
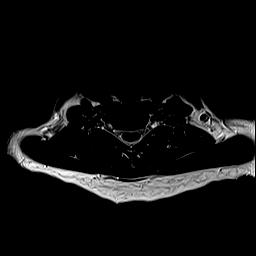
[im 11/29]
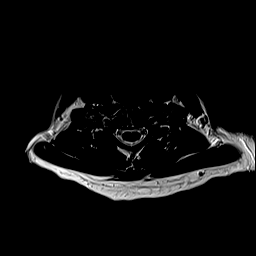
[im 13/29]
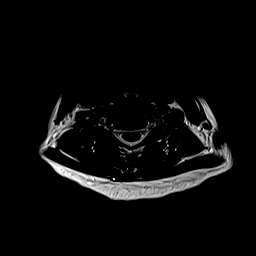
[im 16/29]
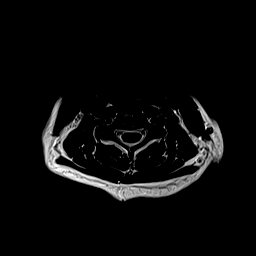
[im 20/29]
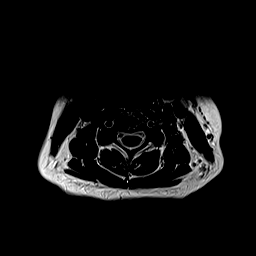
[im 24/29]
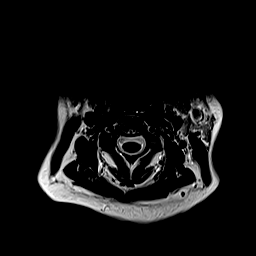
[im 29/29]
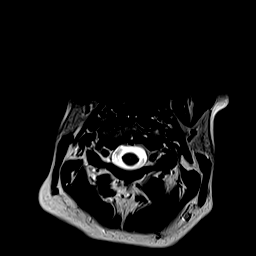

[Series 9: ax mpgr · axial · 3.0mm · 0.35mm/px · z∈[-53,+36]mm · 8 of 29 slices shown]
[im 1/29]
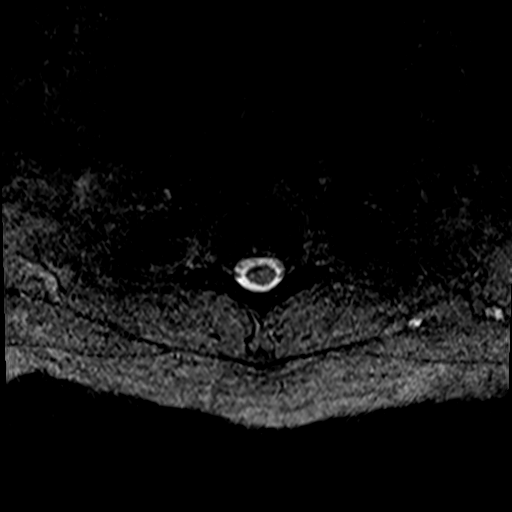
[im 5/29]
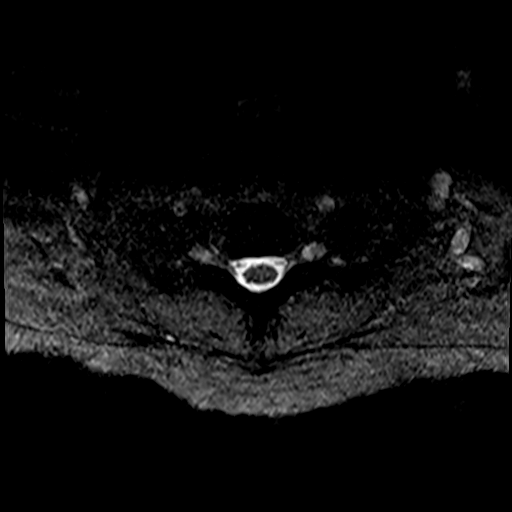
[im 9/29]
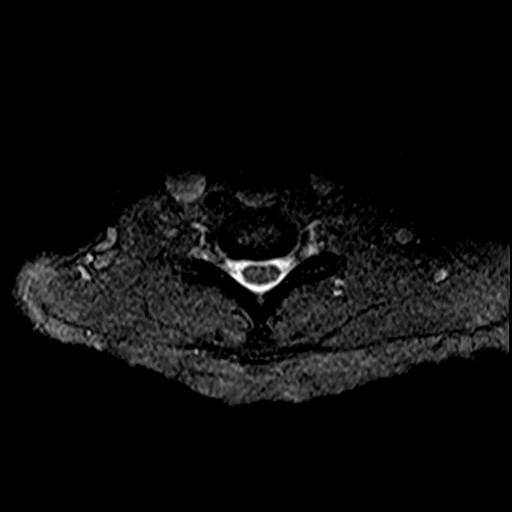
[im 13/29]
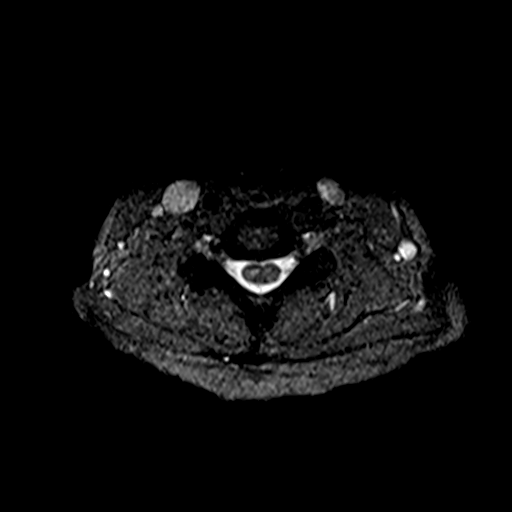
[im 16/29]
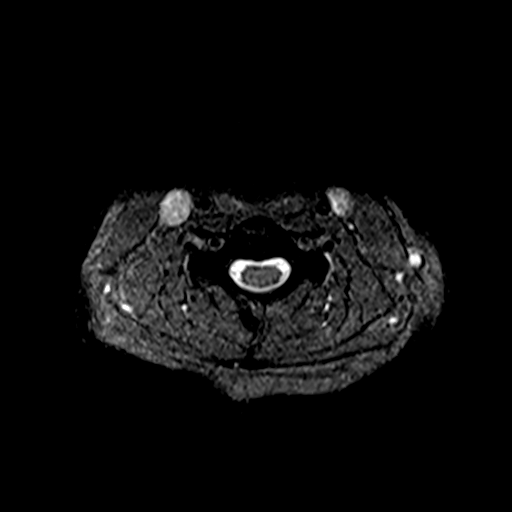
[im 20/29]
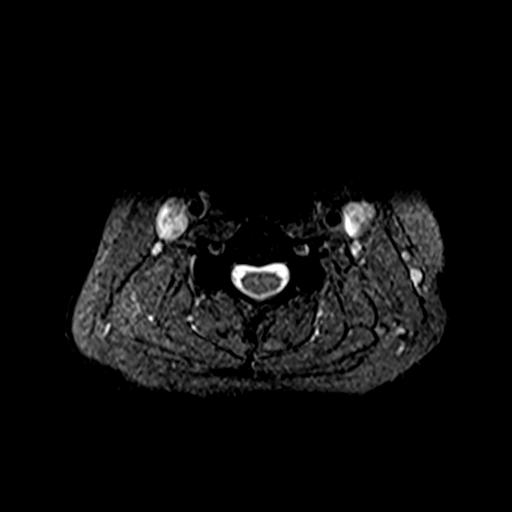
[im 24/29]
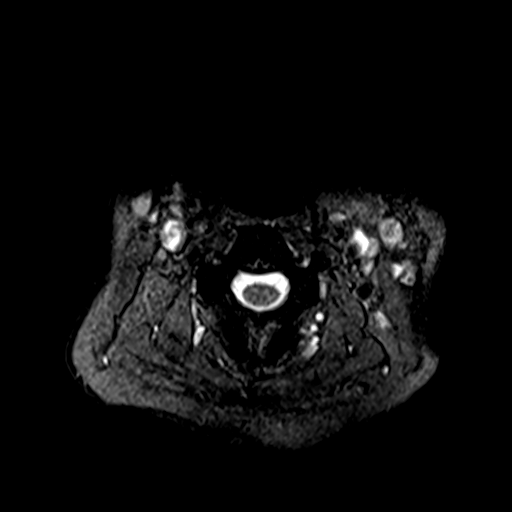
[im 29/29]
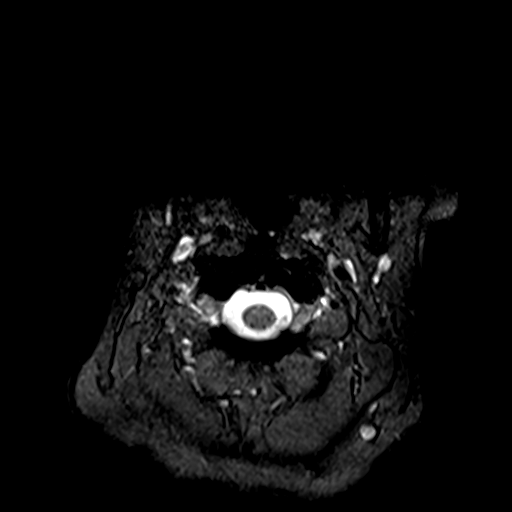

[39 of 48 positions shown; findings below may reference images not displayed]

FINDINGS: Alignment: Straightening of the normal cervical lordosis. No
listhesis or malalignment.

Vertebrae: Vertebral body heights well maintained without evidence
for acute or chronic fracture. Bone marrow signal intensity within
normal limits. No discrete or worrisome osseous lesions. No abnormal
marrow edema.

Cord: Signal intensity within the cervical spinal cord is normal. No
cord signal abnormality to suggest demyelinating disease. Normal
cord caliber and morphology.

Posterior Fossa, vertebral arteries, paraspinal tissues: Visualized
brain within normal limits. Craniocervical junction normal.
Paraspinous and prevertebral soft tissues within normal limits.
Normal intravascular flow voids seen within the vertebral arteries
bilaterally.

Disc levels:

C2-C3: Unremarkable.

C3-C4:  Unremarkable.

C4-C5:  Unremarkable.

C5-C6:  Unremarkable.

C6-C7: Mild annular disc bulge. No significant canal or foraminal
stenosis.

C7-T1:  Unremarkable.

Visualized upper thoracic spine is unremarkable.
IMPRESSION: 1. Normal MRI appearance of the cervical spinal cord. No findings to
suggest demyelinating disease or other abnormality.
2. Minor noncompressive disc bulging at C6-7 without stenosis or
impingement. Otherwise unremarkable MRI of the cervical spine.

## 2020-07-26 ENCOUNTER — Other Ambulatory Visit: Payer: Self-pay | Admitting: Family Medicine

## 2020-07-26 DIAGNOSIS — U071 COVID-19: Secondary | ICD-10-CM

## 2020-10-08 DIAGNOSIS — M7062 Trochanteric bursitis, left hip: Secondary | ICD-10-CM | POA: Insufficient documentation

## 2020-10-22 ENCOUNTER — Inpatient Hospital Stay: Payer: 59

## 2020-10-22 ENCOUNTER — Inpatient Hospital Stay: Payer: 59 | Attending: Oncology

## 2020-10-22 ENCOUNTER — Other Ambulatory Visit: Payer: Self-pay

## 2020-10-22 DIAGNOSIS — Z9884 Bariatric surgery status: Secondary | ICD-10-CM | POA: Insufficient documentation

## 2020-10-22 DIAGNOSIS — Z86718 Personal history of other venous thrombosis and embolism: Secondary | ICD-10-CM | POA: Diagnosis not present

## 2020-10-22 DIAGNOSIS — E538 Deficiency of other specified B group vitamins: Secondary | ICD-10-CM | POA: Diagnosis not present

## 2020-10-22 DIAGNOSIS — E039 Hypothyroidism, unspecified: Secondary | ICD-10-CM | POA: Diagnosis not present

## 2020-10-22 DIAGNOSIS — Z79899 Other long term (current) drug therapy: Secondary | ICD-10-CM | POA: Diagnosis not present

## 2020-10-22 DIAGNOSIS — E519 Thiamine deficiency, unspecified: Secondary | ICD-10-CM

## 2020-10-22 DIAGNOSIS — D508 Other iron deficiency anemias: Secondary | ICD-10-CM | POA: Diagnosis present

## 2020-10-22 LAB — CBC WITH DIFFERENTIAL/PLATELET
Abs Immature Granulocytes: 0.02 10*3/uL (ref 0.00–0.07)
Basophils Absolute: 0 10*3/uL (ref 0.0–0.1)
Basophils Relative: 0 %
Eosinophils Absolute: 0.1 10*3/uL (ref 0.0–0.5)
Eosinophils Relative: 2 %
HCT: 39.4 % (ref 36.0–46.0)
Hemoglobin: 13.3 g/dL (ref 12.0–15.0)
Immature Granulocytes: 0 %
Lymphocytes Relative: 29 %
Lymphs Abs: 2.3 10*3/uL (ref 0.7–4.0)
MCH: 33.1 pg (ref 26.0–34.0)
MCHC: 33.8 g/dL (ref 30.0–36.0)
MCV: 98 fL (ref 80.0–100.0)
Monocytes Absolute: 0.5 10*3/uL (ref 0.1–1.0)
Monocytes Relative: 6 %
Neutro Abs: 4.9 10*3/uL (ref 1.7–7.7)
Neutrophils Relative %: 63 %
Platelets: 321 10*3/uL (ref 150–400)
RBC: 4.02 MIL/uL (ref 3.87–5.11)
RDW: 13.6 % (ref 11.5–15.5)
WBC: 7.9 10*3/uL (ref 4.0–10.5)
nRBC: 0 % (ref 0.0–0.2)

## 2020-10-22 LAB — IRON AND TIBC
Iron: 78 ug/dL (ref 28–170)
Saturation Ratios: 25 % (ref 10.4–31.8)
TIBC: 316 ug/dL (ref 250–450)
UIBC: 238 ug/dL

## 2020-10-22 LAB — VITAMIN B12: Vitamin B-12: 144 pg/mL — ABNORMAL LOW (ref 180–914)

## 2020-10-22 LAB — FOLATE: Folate: 3.7 ng/mL — ABNORMAL LOW (ref >5.9)

## 2020-10-22 LAB — FERRITIN: Ferritin: 116 ng/mL (ref 11–307)

## 2020-10-24 LAB — VITAMIN B1: Vitamin B1 (Thiamine): 88.8 nmol/L (ref 66.5–200.0)

## 2020-10-26 ENCOUNTER — Inpatient Hospital Stay (HOSPITAL_BASED_OUTPATIENT_CLINIC_OR_DEPARTMENT_OTHER): Payer: 59 | Admitting: Oncology

## 2020-10-26 ENCOUNTER — Other Ambulatory Visit: Payer: Self-pay

## 2020-10-26 ENCOUNTER — Encounter: Payer: Self-pay | Admitting: Oncology

## 2020-10-26 DIAGNOSIS — E538 Deficiency of other specified B group vitamins: Secondary | ICD-10-CM | POA: Diagnosis not present

## 2020-10-26 DIAGNOSIS — E519 Thiamine deficiency, unspecified: Secondary | ICD-10-CM

## 2020-10-26 DIAGNOSIS — D649 Anemia, unspecified: Secondary | ICD-10-CM

## 2020-10-26 MED ORDER — CYANOCOBALAMIN 1000 MCG/ML IJ SOLN: 1000.0000 ug | INTRAMUSCULAR | 4 refills | Status: DC

## 2020-10-27 MED ORDER — "SYRINGE 25G X 1"" 3 ML MISC": 3.0000 mL | 0 refills | Status: DC

## 2020-10-29 ENCOUNTER — Encounter: Payer: Self-pay | Admitting: Internal Medicine

## 2020-10-29 NOTE — Progress Notes (Signed)
I connected with Whitney Hill on 10/29/20 at  2:45 PM EDT by video enabled telemedicine visit and verified that I am speaking with the correct person using two identifiers.   I discussed the limitations, risks, security and privacy concerns of performing an evaluation and management service by telemedicine and the availability of in-person appointments. I also discussed with the patient that there may be a patient responsible charge related to this service. The patient expressed understanding and agreed to proceed.  Other persons participating in the visit and their role in the encounter:  none  Patient's location:  home Provider's location:  work  Risk analyst Complaint: Routine follow-up of  folate deficiency  History of present illness: Patient is a 50 year old female with a history of iron deficiency anemia secondary to gastric bypass in 2012. She gets periodic ferraheme. Also has a history of left lower extremity DVT for which she was in 3 months of anticoagulation. Family history positive for factor V Leiden in her mother. He is currently not on long-term anticoagulation. Last Shirlean Kelly was given in January 2018. Patient has been referred by Dr. Caryl Bis for her anemia.  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  Her most recent B1 levels in January 2020 were low at 161.  Most recent iron studies from April 2020 showed a low ferritin of 17.  CMP at that time was normal.  The last CBC that I have for her is from January 2020 when her hemoglobin was 12 and hematocrit 36.7.   Results of blood work from 07/19/2018 were as follows: CBC with differential showed white count of 7.8, H&H of 12.3/36.8 with an MCV of 96.3 and a platelet count of 302.  CMP showed mildly low potassium of 3.1.  Ferritin levels were low at 16.  Iron studies were normal.  B12 level was normal at 344.  Folate was normal at 3.5.  TSH was mildly elevated at 5.9.   Reticulocyte count was low normal at 2.5%.    Interval history doing well overall.  States that she has been taking her folic acid supplements consistently.   Review of Systems  Constitutional:  Positive for malaise/fatigue. Negative for chills, fever and weight loss.  HENT:  Negative for congestion, ear discharge and nosebleeds.   Eyes:  Negative for blurred vision.  Respiratory:  Negative for cough, hemoptysis, sputum production, shortness of breath and wheezing.   Cardiovascular:  Negative for chest pain, palpitations, orthopnea and claudication.  Gastrointestinal:  Negative for abdominal pain, blood in stool, constipation, diarrhea, heartburn, melena, nausea and vomiting.  Genitourinary:  Negative for dysuria, flank pain, frequency, hematuria and urgency.  Musculoskeletal:  Negative for back pain, joint pain and myalgias.  Skin:  Negative for rash.  Neurological:  Negative for dizziness, tingling, focal weakness, seizures, weakness and headaches.  Endo/Heme/Allergies:  Does not bruise/bleed easily.  Psychiatric/Behavioral:  Negative for depression and suicidal ideas. The patient does not have insomnia.    Allergies  Allergen Reactions   Paxil [Paroxetine Hcl] Anaphylaxis    Throat swelled shut   Penicillins Hives    Past Medical History:  Diagnosis Date   Arthritis    OA   Complication of anesthesia    "ITCHING TO FACE, BENADRYL HELP BUT DROPS BLOOD PRESSURE"   DVT (deep venous thrombosis) ([facility])    following first knee surgery in 2011   Gastric ulcer    Headache    Heterozygous factor V  Leiden mutation [facility])    Hypothyroid    Iron deficiency anemia     Past Surgical History:  Procedure Laterality Date   [place]   COLONOSCOPY WITH PROPOFOL N/A 06/14/2018   Procedure: COLONOSCOPY WITH BIOPSY;  Surgeon: Lucilla Lame, MD;  Location: [place];  Service: Endoscopy;  Laterality: N/A;   DILATION AND  CURETTAGE OF UTERUS     1994, 2001   ESOPHAGOGASTRODUODENOSCOPY (EGD) WITH PROPOFOL N/A 06/14/2018   Procedure: ESOPHAGOGASTRODUODENOSCOPY (EGD) WITH PROPOFOL;  Surgeon: Lucilla Lame, MD;  Location: [place];  Service: Endoscopy;  Laterality: N/A;   FOREIGN BODY REMOVAL N/A 06/14/2018   Procedure: FOREIGN BODY REMOVAL;  Surgeon: Lucilla Lame, MD;  Location: [place];  Service: Endoscopy;  Laterality: N/A;  Staple Removal from Stomach   GASTRIC BYPASS     2012, revision 2013   KNEE ARTHROSCOPY     2011, 2013, 2015   POLYPECTOMY N/A 06/14/2018   Procedure: POLYPECTOMY;  Surgeon: Lucilla Lame, MD;  Location: [place];  Service: Endoscopy;  Laterality: N/A;   TOOTH EXTRACTION     2010   TOTAL KNEE ARTHROPLASTY Left 05/25/2015   Procedure: LEFT TOTAL KNEE ARTHROPLASTY;  Surgeon: Renette Butters, MD;  Location: [place];  Service: Orthopedics;  Laterality: Left;   TUBAL LIGATION     2004    Social History   Socioeconomic History   Marital status: Married    Spouse name: Not on file   Number of children: Not on file   Years of education: Not on file   Highest education level: Not on file  Occupational History   Not on file  Tobacco Use   Smoking status: Never   Smokeless tobacco: Never  Vaping Use   Vaping Use: Never used  Substance and Sexual Activity   Alcohol use: No    Alcohol/week: 0.0 standard drinks   Drug use: No   Sexual activity: Not on file  Other Topics Concern   Not on file  Social History Narrative   Not on file   Social Determinants of Health   Financial Resource Strain: Not on file  Food Insecurity: Not on file  Transportation Needs: Not on file  Physical Activity: Not on file  Stress: Not on file  Social Connections: Not on file  Intimate Partner Violence: Not on file    Family History  Problem Relation Age of Onset   Arthritis Other        parent   Hyperlipidemia Other        parent   Stroke Other        parent    Hypertension Other        parent   Kidney disease Other        parent, grandparent   Diabetes Other        parent, grandparent   Renal cancer Other        parent   Breast cancer Maternal Aunt 75       BRACA + dx twice age 73 and 10   Breast cancer Maternal Grandmother        late 50's     Current Outpatient Medications:    cyanocobalamin (,VITAMIN B-12,) 1000 MCG/ML injection, Inject 1 mL (1,000 mcg total) into the muscle every 30 (thirty) days. Once a month IM injection, Disp: 1 mL, Rfl: 4   diclofenac sodium (VOLTAREN) 1 %  GEL, Apply 2 g topically 4 (four) times daily., Disp: , Rfl:    EPINEPHrine 0.3 mg/0.3 mL IJ SOAJ injection, Inject into the muscle., Disp: , Rfl:    folic acid (FOLVITE) 1 MG tablet, TAKE 1 TABLET BY MOUTH EVERY DAY, Disp: 90 tablet, Rfl: 1   levothyroxine (SYNTHROID) 175 MCG tablet, Take 1 tablet (175 mcg total) by mouth daily before breakfast. (Patient taking differently: Take 150 mcg by mouth daily before breakfast.), Disp: 90 tablet, Rfl: 1   multivitamin-iron-minerals-folic acid (CENTRUM) chewable tablet, Chew 1 tablet by mouth daily., Disp: , Rfl:    Syringe/Needle, Disp, (SYRINGE 3CC/25GX1") 25G X 1" 3 ML MISC, 3 mLs by Does not apply route every 30 (thirty) days., Disp: 12 each, Rfl: 0   thiamine 100 MG tablet, Take 1 tablet (100 mg total) by mouth daily., Disp: , Rfl:    tirzepatide (MOUNJARO) 2.5 MG/0.5ML Pen, INJECT 2.5 MG SUBCUTANEOUSLY ONCE WEEKLY., Disp: , Rfl:    fluticasone (FLONASE) 50 MCG/ACT nasal spray, SPRAY 2 SPRAYS INTO EACH NOSTRIL EVERY DAY (Patient not taking: Reported on 10/26/2020), Disp: 48 mL, Rfl: 2   OZEMPIC, 1 MG/DOSE, 4 MG/3ML SOPN, Inject 1 mg into the skin once a week. (Patient not taking: Reported on 10/26/2020), Disp: , Rfl:   No results found.  No images are attached to the encounter.   CMP Latest Ref Rng & Units 04/08/2020  Glucose 70 - 99 mg/dL 95  BUN 6 - 20 mg/dL 18  Creatinine 0.44 - 1.00 mg/dL 0.90  Sodium 135 -  145 mmol/L 138  Potassium 3.5 - 5.1 mmol/L 3.7  Chloride 98 - 111 mmol/L 104  CO2 22 - 32 mmol/L 25  Calcium 8.9 - 10.3 mg/dL 9.1  Total Protein 6.5 - 8.1 g/dL 7.1  Total Bilirubin 0.3 - 1.2 mg/dL 0.5  Alkaline Phos 38 - 126 U/L 89  AST 15 - 41 U/L 15  ALT 0 - 44 U/L 11   CBC Latest Ref Rng & Units 10/22/2020  WBC 4.0 - 10.5 K/uL 7.9  Hemoglobin 12.0 - 15.0 g/dL 13.3  Hematocrit 36.0 - 46.0 % 39.4  Platelets 150 - 400 K/uL 321     Observation/objective: Appears in no acute distress over video visit today.  Breathing is nonlabored  Assessment and plan: Patient is a 50 year old female with history of folate deficiency here for routine follow-up  Patient is not currently anemic and her hemoglobin is remained stable between 12-13.  Presently 13.3.  Ferritin levels are normal at 116 with normal iron studies.  Folate levels continue to be low at 3.7 and have asked her to double up her folic acid to 2 mg daily.  She also has a history of B1 deficiency which is currently normal with oral supplementation.  B12 levels are currently low at 144 as well.  Patient would like to self administer B12 injections and we will send to her pharmacy.  We will check CBC with differential B12 B1 and folate levels in 4 and 8 months and I will see her back in 8 months  Follow-up instructions: As above  I discussed the assessment and treatment plan with the patient. The patient was provided an opportunity to ask questions and all were answered. The patient agreed with the plan and demonstrated an understanding of the instructions.   The patient was advised to call back or seek an in-person evaluation if the symptoms worsen or if the condition fails to improve as anticipated.   Visit Diagnosis:  1. Folate deficiency   2. Vitamin B1 deficiency   3. Normocytic anemia     Dr. Randa Evens, MD, MPH [place] at [facility] Tel- [phone] 10/29/2020 8:30 AM

## 2020-12-01 ENCOUNTER — Telehealth: Payer: 59 | Admitting: Family

## 2020-12-01 ENCOUNTER — Other Ambulatory Visit: Payer: Self-pay | Admitting: Oncology

## 2020-12-01 DIAGNOSIS — Z20822 Contact with and (suspected) exposure to covid-19: Secondary | ICD-10-CM | POA: Diagnosis not present

## 2020-12-01 MED ORDER — BENZONATATE 100 MG PO CAPS: 100.0000 mg | ORAL_CAPSULE | Freq: Three times a day (TID) | ORAL | 0 refills | Status: DC | PRN

## 2020-12-01 MED ORDER — MOLNUPIRAVIR EUA 200MG CAPSULE: 4.0000 | ORAL_CAPSULE | Freq: Two times a day (BID) | ORAL | 0 refills | Status: AC

## 2020-12-01 NOTE — Progress Notes (Signed)
Virtual Visit Consent   Whitney Hill, you are scheduled for a virtual visit with a [facility] provider today.     Just as with appointments in the office, your consent must be obtained to participate.  Your consent will be active for this visit and any virtual visit you may have with one of our providers in the next 365 days.     If you have a MyChart account, a copy of this consent can be sent to you electronically.  All virtual visits are billed to your insurance company just like a traditional visit in the office.    As this is a virtual visit, video technology does not allow for your provider to perform a traditional examination.  This may limit your provider's ability to fully assess your condition.  If your provider identifies any concerns that need to be evaluated in person or the need to arrange testing (such as labs, EKG, etc.), we will make arrangements to do so.     Although advances in technology are sophisticated, we cannot ensure that it will always work on either your end or our end.  If the connection with a video visit is poor, the visit may have to be switched to a telephone visit.  With either a video or telephone visit, we are not always able to ensure that we have a secure connection.     I need to obtain your verbal consent now.   Are you willing to proceed with your visit today?    Whitney Hill has provided verbal consent on 12/01/2020 for a virtual visit (video or telephone).   Evelina Dun, FNP   Date: 12/01/2020 8:46 AM   Virtual Visit via Video Note   I, Evelina Dun, connected with  Whitney Hill  ([id number], May 14, 1970) on 12/01/20 at  8:45 AM EST by a video-enabled telemedicine application and verified that I am speaking with the correct person using two identifiers.  Location: Patient: Virtual Visit Location Patient: Home Provider: Virtual Visit Location Provider: Home Office   I discussed the limitations of evaluation and management by  telemedicine and the availability of in person appointments. The patient expressed understanding and agreed to proceed.    History of Present Illness: Whitney Hill is a 50 y.o. who identifies as a female who was assigned female at birth, and is being seen today for cough.  HPI: Cough This is a new problem. The current episode started in the past 7 days. The problem has been rapidly worsening. The problem occurs every few minutes. The cough is Non-productive. Associated symptoms include chills, ear congestion, headaches, myalgias and nasal congestion. Pertinent negatives include no ear pain, shortness of breath or wheezing. She has tried rest and OTC cough suppressant for the symptoms. The treatment provided mild relief.   Problems:  Patient Active Problem List   Diagnosis Date Noted   Patellofemoral syndrome of right knee 02/23/2020   COVID-19 01/19/2020   Proteinuria 01/19/2020   Arthritis of right knee 08/21/2019   Osteoarthritis of ankle and foot 08/21/2019   Nocturia 08/05/2019   Hamstring strain 08/05/2019   History of abnormal cervical Pap smear 07/09/2018   Polyp of sigmoid colon    Menorrhagia with irregular cycle 06/04/2018   Left arm weakness 02/20/2018   Left elbow pain 02/05/2018   Hypokalemia 02/05/2018   Thiamine deficiency 01/17/2018   Arthralgia 09/18/2017   Tick bite 09/18/2017   Vitamin D deficiency 09/18/2017   Hashimoto's disease 09/18/2017  Vision loss 09/18/2017   Hives 03/13/2017   Constipation 04/28/2016   Right ankle pain 04/28/2016   Abnormal uterine bleeding 02/07/2016   Iron deficiency 02/04/2016   Late period 11/24/2015   Special screening for malignant neoplasms, colon 10/14/2015   Skin nodule 09/20/2015   Status post left partial knee replacement, medial aspect 07/09/2015   Obesity 07/09/2015   Knee osteochondritis dessicans 05/25/2015   Plantar fasciitis 05/13/2015   Iron deficiency anemia 01/14/2015   Hypothyroidism 10/01/2014   Left  knee pain 10/01/2014   Cramps, extremity 10/01/2014   Allergic rhinitis 10/01/2014   S/P gastric bypass 10/01/2014    Allergies:  Allergies  Allergen Reactions   Paxil [Paroxetine Hcl] Anaphylaxis    Throat swelled shut   Penicillins Hives   Medications:  Current Outpatient Medications:    benzonatate (TESSALON PERLES) 100 MG capsule, Take 1 capsule (100 mg total) by mouth 3 (three) times daily as needed., Disp: 20 capsule, Rfl: 0   molnupiravir EUA (LAGEVRIO) 200 mg CAPS capsule, Take 4 capsules (800 mg total) by mouth 2 (two) times daily for 5 days., Disp: 40 capsule, Rfl: 0   cyanocobalamin (,VITAMIN B-12,) 1000 MCG/ML injection, Inject 1 mL (1,000 mcg total) into the muscle every 30 (thirty) days. Once a month IM injection, Disp: 1 mL, Rfl: 4   diclofenac sodium (VOLTAREN) 1 % GEL, Apply 2 g topically 4 (four) times daily., Disp: , Rfl:    EPINEPHrine 0.3 mg/0.3 mL IJ SOAJ injection, Inject into the muscle., Disp: , Rfl:    fluticasone (FLONASE) 50 MCG/ACT nasal spray, SPRAY 2 SPRAYS INTO EACH NOSTRIL EVERY DAY (Patient not taking: Reported on 10/26/2020), Disp: 48 mL, Rfl: 2   folic acid (FOLVITE) 1 MG tablet, TAKE 1 TABLET BY MOUTH EVERY DAY, Disp: 90 tablet, Rfl: 1   levothyroxine (SYNTHROID) 175 MCG tablet, Take 1 tablet (175 mcg total) by mouth daily before breakfast. (Patient taking differently: Take 150 mcg by mouth daily before breakfast.), Disp: 90 tablet, Rfl: 1   multivitamin-iron-minerals-folic acid (CENTRUM) chewable tablet, Chew 1 tablet by mouth daily., Disp: , Rfl:    OZEMPIC, 1 MG/DOSE, 4 MG/3ML SOPN, Inject 1 mg into the skin once a week. (Patient not taking: Reported on 10/26/2020), Disp: , Rfl:    Syringe/Needle, Disp, (SYRINGE 3CC/25GX1") 25G X 1" 3 ML MISC, 3 mLs by Does not apply route every 30 (thirty) days., Disp: 12 each, Rfl: 0   thiamine 100 MG tablet, Take 1 tablet (100 mg total) by mouth daily., Disp: , Rfl:    tirzepatide (MOUNJARO) 2.5 MG/0.5ML Pen,  INJECT 2.5 MG SUBCUTANEOUSLY ONCE WEEKLY., Disp: , Rfl:   Observations/Objective: Patient is well-developed, well-nourished in no acute distress.  Resting comfortably  at home.  Head is normocephalic, atraumatic.  No labored breathing.  Speech is clear and coherent with logical content.  Patient is alert and oriented at baseline.  Nasal congestion  Assessment and Plan: 1. Exposure to COVID-19 virus - molnupiravir EUA (LAGEVRIO) 200 mg CAPS capsule; Take 4 capsules (800 mg total) by mouth 2 (two) times daily for 5 days.  Dispense: 40 capsule; Refill: 0 - benzonatate (TESSALON PERLES) 100 MG capsule; Take 1 capsule (100 mg total) by mouth 3 (three) times daily as needed.  Dispense: 20 capsule; Refill: 0 COVID positive, rest, force fluids, tylenol as needed, Quarantine for at least 5 days and you are fever free, then must wear a mask out in public from day 3-78, report any worsening symptoms such as increased  shortness of breath, swelling, or continued high fevers. Possible adverse effects discussed with antivirals.    Follow Up Instructions: I discussed the assessment and treatment plan with the patient. The patient was provided an opportunity to ask questions and all were answered. The patient agreed with the plan and demonstrated an understanding of the instructions.  A copy of instructions were sent to the patient via MyChart unless otherwise noted below.     The patient was advised to call back or seek an in-person evaluation if the symptoms worsen or if the condition fails to improve as anticipated.  Time:  I spent 9 minutes with the patient via telehealth technology discussing the above problems/concerns.    Evelina Dun, FNP

## 2020-12-01 NOTE — Patient Instructions (Signed)
10 Things You Can Do to Manage Your COVID-19 Symptoms at Home ?If you have possible or confirmed COVID-19 ?Stay home except to get medical care. ?Monitor your symptoms carefully. If your symptoms get worse, call your healthcare provider immediately. ?Get rest and stay hydrated. ?If you have a medical appointment, call the healthcare provider ahead of time and tell them that you have or may have COVID-19. ?For medical emergencies, call 911 and notify the dispatch personnel that you have or may have COVID-19. ?Cover your cough and sneezes with a tissue or use the inside of your elbow. ?Wash your hands often with soap and water for at least 20 seconds or clean your hands with an alcohol-based hand sanitizer that contains at least 60% alcohol. ?As much as possible, stay in a specific room and away from other people in your home. Also, you should use a separate bathroom, if available. If you need to be around other people in or outside of the home, wear a mask. ?Avoid sharing personal items with other people in your household, like dishes, towels, and bedding. ?Clean all surfaces that are touched often, like counters, tabletops, and doorknobs. Use household cleaning sprays or wipes according to the label instructions. ?cdc.gov/coronavirus ?07/25/2019 ?This information is not intended to replace advice given to you by your health care provider. Make sure you discuss any questions you have with your health care provider. ?Document Revised: 09/17/2020 Document Reviewed: 09/17/2020 ?Elsevier Patient Education ? 2022 Elsevier Inc. ? ?

## 2020-12-08 ENCOUNTER — Encounter: Payer: Self-pay | Admitting: Emergency Medicine

## 2020-12-08 ENCOUNTER — Other Ambulatory Visit: Payer: Self-pay

## 2020-12-08 ENCOUNTER — Emergency Department: Payer: 59

## 2020-12-08 ENCOUNTER — Emergency Department
Admission: EM | Admit: 2020-12-08 | Discharge: 2020-12-08 | Disposition: A | Discharge status: Home / Self Care | Payer: 59 | Attending: Emergency Medicine | Admitting: Emergency Medicine

## 2020-12-08 DIAGNOSIS — E039 Hypothyroidism, unspecified: Secondary | ICD-10-CM | POA: Insufficient documentation

## 2020-12-08 DIAGNOSIS — Z8616 Personal history of COVID-19: Secondary | ICD-10-CM | POA: Insufficient documentation

## 2020-12-08 DIAGNOSIS — Z96652 Presence of left artificial knee joint: Secondary | ICD-10-CM | POA: Insufficient documentation

## 2020-12-08 DIAGNOSIS — R109 Unspecified abdominal pain: Secondary | ICD-10-CM

## 2020-12-08 DIAGNOSIS — R112 Nausea with vomiting, unspecified: Secondary | ICD-10-CM | POA: Diagnosis not present

## 2020-12-08 DIAGNOSIS — E860 Dehydration: Secondary | ICD-10-CM | POA: Diagnosis not present

## 2020-12-08 DIAGNOSIS — E876 Hypokalemia: Secondary | ICD-10-CM | POA: Diagnosis not present

## 2020-12-08 DIAGNOSIS — Z79899 Other long term (current) drug therapy: Secondary | ICD-10-CM | POA: Insufficient documentation

## 2020-12-08 LAB — COMPREHENSIVE METABOLIC PANEL
ALT: 18 U/L (ref 0–44)
AST: 20 U/L (ref 15–41)
Albumin: 4.3 g/dL (ref 3.5–5.0)
Alkaline Phosphatase: 76 U/L (ref 38–126)
Anion gap: 7 (ref 5–15)
BUN: 21 mg/dL — ABNORMAL HIGH (ref 6–20)
CO2: 24 mmol/L (ref 22–32)
Calcium: 9.1 mg/dL (ref 8.9–10.3)
Chloride: 104 mmol/L (ref 98–111)
Creatinine, Ser: 0.86 mg/dL (ref 0.44–1.00)
GFR, Estimated: >60 mL/min (ref >60)
Glucose, Bld: 121 mg/dL — ABNORMAL HIGH (ref 70–99)
Potassium: 2.9 mmol/L — ABNORMAL LOW (ref 3.5–5.1)
Sodium: 135 mmol/L (ref 135–145)
Total Bilirubin: 0.6 mg/dL (ref 0.3–1.2)
Total Protein: 7.4 g/dL (ref 6.5–8.1)

## 2020-12-08 LAB — PREGNANCY, URINE: Preg Test, Ur: NEGATIVE

## 2020-12-08 LAB — CBC
HCT: 40 % (ref 36.0–46.0)
Hemoglobin: 13.5 g/dL (ref 12.0–15.0)
MCH: 32.5 pg (ref 26.0–34.0)
MCHC: 33.8 g/dL (ref 30.0–36.0)
MCV: 96.4 fL (ref 80.0–100.0)
Platelets: 320 10*3/uL (ref 150–400)
RBC: 4.15 MIL/uL (ref 3.87–5.11)
RDW: 13.2 % (ref 11.5–15.5)
WBC: 8.7 10*3/uL (ref 4.0–10.5)
nRBC: 0 % (ref 0.0–0.2)

## 2020-12-08 LAB — URINALYSIS, ROUTINE W REFLEX MICROSCOPIC
Glucose, UA: NEGATIVE mg/dL
Hgb urine dipstick: NEGATIVE
Ketones, ur: 15 mg/dL — AB
Leukocytes,Ua: NEGATIVE
Nitrite: NEGATIVE
Specific Gravity, Urine: 1.025 (ref 1.005–1.030)
pH: 6 (ref 5.0–8.0)

## 2020-12-08 LAB — URINALYSIS, MICROSCOPIC (REFLEX)
Bacteria, UA: NONE SEEN
RBC / HPF: NONE SEEN RBC/hpf (ref 0–5)

## 2020-12-08 LAB — LIPASE, BLOOD: Lipase: 31 U/L (ref 11–51)

## 2020-12-08 MED ORDER — ONDANSETRON 4 MG PO TBDP: 4.0000 mg | ORAL_TABLET | Freq: Three times a day (TID) | ORAL | 0 refills | Status: DC | PRN

## 2020-12-08 MED ORDER — POTASSIUM CHLORIDE 10 MEQ/100ML IV SOLN
10.0000 meq | Freq: Once | INTRAVENOUS | Status: AC
  Administered 2020-12-08: 10 meq via INTRAVENOUS
  Filled 2020-12-08: qty 100

## 2020-12-08 MED ORDER — ONDANSETRON 4 MG PO TBDP: 4.0000 mg | ORAL_TABLET | Freq: Once | ORAL | Status: DC | PRN

## 2020-12-08 MED ORDER — SODIUM CHLORIDE 0.9 % IV BOLUS
1000.0000 mL | Freq: Once | INTRAVENOUS | Status: AC
  Administered 2020-12-08: 1000 mL via INTRAVENOUS

## 2020-12-08 MED ORDER — MORPHINE SULFATE (PF) 4 MG/ML IV SOLN
4.0000 mg | Freq: Once | INTRAVENOUS | Status: AC
  Administered 2020-12-08: 4 mg via INTRAVENOUS
  Filled 2020-12-08: qty 1

## 2020-12-08 MED ORDER — FAMOTIDINE IN NACL 20-0.9 MG/50ML-% IV SOLN
20.0000 mg | Freq: Once | INTRAVENOUS | Status: AC
  Administered 2020-12-08: 20 mg via INTRAVENOUS
  Filled 2020-12-08: qty 50

## 2020-12-08 MED ORDER — ONDANSETRON HCL 4 MG/2ML IJ SOLN
4.0000 mg | Freq: Once | INTRAMUSCULAR | Status: AC
  Administered 2020-12-08: 4 mg via INTRAVENOUS
  Filled 2020-12-08: qty 2

## 2020-12-08 MED ORDER — PROMETHAZINE HCL 12.5 MG PO TABS: 12.5000 mg | ORAL_TABLET | Freq: Four times a day (QID) | ORAL | 0 refills | Status: DC | PRN

## 2020-12-08 NOTE — ED Provider Notes (Signed)
[facility] Emergency Department Provider Note   ____________________________________________   Event Date/Time   First MD Initiated Contact with Patient 12/08/20 [phone]     (approximate)  I have reviewed the triage vital signs and the nursing notes.   HISTORY  Chief Complaint Emesis    HPI KAELI NICHELSON is a 50 y.o. female who presents to the ED from home with a chief complaint of vomiting and dehydration.  Patient was switched from [place] to [place] several months ago.  She noticed several weeks ago that the medicine is working too well and she has had no appetite.  Was on 12.5 mg but decreased herself to 10 mg.  Has been vomiting occasionally over the past 2 weeks and feels dehydrated.  Tried drinking a protein shake yesterday and threw it up.  She has also been under a lot of stress due to her father's death as well as having COVID-19 last week.  Denies fever, cough, chest pain, shortness of breath, diarrhea.  Last BM 2 days ago.  History of Roux-en-Y gastric bypass 2014.     Past Medical History:  Diagnosis Date   Arthritis    OA   Complication of anesthesia    "ITCHING TO FACE, BENADRYL HELP BUT DROPS BLOOD PRESSURE"   DVT (deep venous thrombosis) (HCC)    following first knee surgery in 2011   Gastric ulcer    Headache    Heterozygous factor V Leiden mutation ([place])    Hypothyroid    Iron deficiency anemia     Patient Active Problem List   Diagnosis Date Noted   Patellofemoral syndrome of right knee 02/23/2020   COVID-19 01/19/2020   Proteinuria 01/19/2020   Arthritis of right knee 08/21/2019   Osteoarthritis of ankle and foot 08/21/2019   Nocturia 08/05/2019   Hamstring strain 08/05/2019   History of abnormal cervical Pap smear 07/09/2018   Polyp of sigmoid colon    Menorrhagia with irregular cycle 06/04/2018   Left arm weakness 02/20/2018   Left elbow pain 02/05/2018   Hypokalemia 02/05/2018   Thiamine deficiency 01/17/2018    Arthralgia 09/18/2017   Tick bite 09/18/2017   Vitamin D deficiency 09/18/2017   Hashimoto's disease 09/18/2017   Vision loss 09/18/2017   Hives 03/13/2017   Constipation 04/28/2016   Right ankle pain 04/28/2016   Abnormal uterine bleeding 02/07/2016   Iron deficiency 02/04/2016   Late period 11/24/2015   Special screening for malignant neoplasms, colon 10/14/2015   Skin nodule 09/20/2015   Status post left partial knee replacement, medial aspect 07/09/2015   Obesity 07/09/2015   Knee osteochondritis dessicans 05/25/2015   Plantar fasciitis 05/13/2015   Iron deficiency anemia 01/14/2015   Hypothyroidism 10/01/2014   Left knee pain 10/01/2014   Cramps, extremity 10/01/2014   Allergic rhinitis 10/01/2014   S/P gastric bypass 10/01/2014    Past Surgical History:  Procedure Laterality Date   [place]   COLONOSCOPY WITH PROPOFOL N/A 06/14/2018   Procedure: COLONOSCOPY WITH BIOPSY;  Surgeon: Lucilla Lame, MD;  Location: [facility];  Service: Endoscopy;  Laterality: N/A;   DILATION AND CURETTAGE OF UTERUS     1994, 2001   ESOPHAGOGASTRODUODENOSCOPY (EGD) WITH PROPOFOL N/A 06/14/2018   Procedure: ESOPHAGOGASTRODUODENOSCOPY (EGD) WITH PROPOFOL;  Surgeon: Lucilla Lame, MD;  Location: [place] [facility];  Service: Endoscopy;  Laterality: N/A;   FOREIGN BODY REMOVAL N/A 06/14/2018  Procedure: FOREIGN BODY REMOVAL;  Surgeon: Lucilla Lame, MD;  Location: [facility];  Service: Endoscopy;  Laterality: N/A;  Staple Removal from Stomach   GASTRIC BYPASS     2012, revision 2013   KNEE ARTHROSCOPY     2011, 2013, 2015   POLYPECTOMY N/A 06/14/2018   Procedure: POLYPECTOMY;  Surgeon: Lucilla Lame, MD;  Location: [facility];  Service: Endoscopy;  Laterality: N/A;   TOOTH EXTRACTION     2010   TOTAL KNEE ARTHROPLASTY Left 05/25/2015   Procedure: LEFT TOTAL KNEE ARTHROPLASTY;  Surgeon: Renette Butters, MD;   Location: [facility];  Service: Orthopedics;  Laterality: Left;   TUBAL LIGATION     2004    Prior to Admission medications   Medication Sig Start Date End Date Taking? Authorizing Provider  benzonatate (TESSALON PERLES) 100 MG capsule Take 1 capsule (100 mg total) by mouth 3 (three) times daily as needed. 12/01/20   Evelina Dun A, FNP  cyanocobalamin (,VITAMIN B-12,) 1000 MCG/ML injection Inject 1 mL (1,000 mcg total) into the muscle every 30 (thirty) days. Once a month IM injection 10/26/20   Sindy Guadeloupe, MD  diclofenac sodium (VOLTAREN) 1 % GEL Apply 2 g topically 4 (four) times daily.    [provider]  EPINEPHrine 0.3 mg/0.3 mL IJ SOAJ injection Inject into the muscle. 10/04/16   [provider]  fluticasone (FLONASE) 50 MCG/ACT nasal spray SPRAY 2 SPRAYS INTO EACH NOSTRIL EVERY DAY Patient not taking: Reported on 10/26/2020 07/26/20   Leone Haven, MD  folic acid (FOLVITE) 1 MG tablet TAKE 1 TABLET BY MOUTH EVERY DAY 12/01/20   Sindy Guadeloupe, MD  levothyroxine (SYNTHROID) 175 MCG tablet Take 1 tablet (175 mcg total) by mouth daily before breakfast. Patient taking differently: Take 150 mcg by mouth daily before breakfast. 10/04/19   Leone Haven, MD  multivitamin-iron-minerals-folic acid (CENTRUM) chewable tablet Chew 1 tablet by mouth daily.    [provider]  OZEMPIC, 1 MG/DOSE, 4 MG/3ML SOPN Inject 1 mg into the skin once a week. Patient not taking: Reported on 10/26/2020 03/24/20   [provider]  Syringe/Needle, Disp, (SYRINGE 3CC/25GX1") 25G X 1" 3 ML MISC 3 mLs by Does not apply route every 30 (thirty) days. 10/27/20   Sindy Guadeloupe, MD  thiamine 100 MG tablet Take 1 tablet (100 mg total) by mouth daily. 01/23/18   Demetrios Loll, MD  tirzepatide [facility]) 2.5 MG/0.5ML Pen INJECT 2.5 MG SUBCUTANEOUSLY ONCE WEEKLY. 08/09/20   [provider]    Allergies Paxil [paroxetine hcl] and Penicillins  Family History  Problem Relation  Age of Onset   Arthritis Other        parent   Hyperlipidemia Other        parent   Stroke Other        parent   Hypertension Other        parent   Kidney disease Other        parent, grandparent   Diabetes Other        parent, grandparent   Renal cancer Other        parent   Breast cancer Maternal Aunt 60       BRACA + dx twice age 44 and 73   Breast cancer Maternal Grandmother        late 76's    Social History Social History   Tobacco Use   Smoking status: Never   Smokeless tobacco: Never  Vaping Use   Vaping Use: Never used  Substance Use Topics   Alcohol use: No    Alcohol/week: 0.0 standard drinks   Drug use: No    Review of Systems  Constitutional: No fever/chills Eyes: No visual changes. ENT: No sore throat. Cardiovascular: Denies chest pain. Respiratory: Denies shortness of breath. Gastrointestinal: No abdominal pain.  Positive for vomiting.  No diarrhea.  No constipation. Genitourinary: Negative for dysuria. Musculoskeletal: Negative for back pain. Skin: Negative for rash. Neurological: Negative for headaches, focal weakness or numbness.   ____________________________________________   PHYSICAL EXAM:  VITAL SIGNS: ED Triage Vitals [12/08/20 0008]  Enc Vitals Group     BP 125/83     Pulse Rate 85     Resp 18     Temp 97.9 F (36.6 C)     Temp Source Oral     SpO2 97 %     Weight 208 lb (94.3 kg)     Height 5\' 5"  (1.651 m)     Head Circumference      Peak Flow      Pain Score 0     Pain Loc      Pain Edu?      Excl. in [place]?     Constitutional: Alert and oriented. Well appearing and in mild acute distress. Eyes: Conjunctivae are normal. PERRL. EOMI. Head: Atraumatic. Nose: No congestion/rhinnorhea. Mouth/Throat: Mucous membranes are mildly dry. Neck: No stridor.   Cardiovascular: Normal rate, regular rhythm. Grossly normal heart sounds.  Good peripheral circulation. Respiratory: Normal respiratory effort.  No retractions. Lungs  CTAB. Gastrointestinal: Soft and nontender to light or deep palpation. No distention. No abdominal bruits. No CVA tenderness. Musculoskeletal: No lower extremity tenderness nor edema.  No joint effusions. Neurologic:  Normal speech and language. No gross focal neurologic deficits are appreciated. No gait instability. Skin:  Skin is warm, dry and intact. No rash noted. Psychiatric: Mood and affect are normal. Speech and behavior are normal.  ____________________________________________   LABS (all labs ordered are listed, but only abnormal results are displayed)  Labs Reviewed  COMPREHENSIVE METABOLIC PANEL - Abnormal; Notable for the following components:      Result Value   Potassium 2.9 (*)    Glucose, Bld 121 (*)    BUN 21 (*)    All other components within normal limits  URINALYSIS, ROUTINE W REFLEX MICROSCOPIC - Abnormal; Notable for the following components:   Bilirubin Urine SMALL (*)    Ketones, ur 15 (*)    Protein, ur TRACE (*)    All other components within normal limits  LIPASE, BLOOD  CBC  URINALYSIS, MICROSCOPIC (REFLEX)  PREGNANCY, URINE   ____________________________________________  EKG  None ____________________________________________  RADIOLOGY I, , J, personally viewed and evaluated these images (plain radiographs) as part of my medical decision making, as well as reviewing the written report by the radiologist.  ED MD interpretation: CT unremarkable  Official radiology report(s): CT Abdomen Pelvis Wo Contrast  Result Date: 12/08/2020 CLINICAL DATA:  50 year old female with nausea vomiting. Decreased p.o. intake. Positive for COVID-19 November 24th. EXAM: CT ABDOMEN AND PELVIS WITHOUT CONTRAST TECHNIQUE: Multidetector CT imaging of the abdomen and pelvis was performed following the standard protocol without IV contrast. COMPARISON:  Noncontrast CT Abdomen and Pelvis 09/01/2019. FINDINGS: Lower chest: Lower lung volumes. Mild lung base  atelectasis but no pleural effusion or lung base pneumonia. Hepatobiliary: Chronically absent gallbladder. Negative noncontrast liver. Pancreas: Partial pancreatic atrophy. Spleen: Negative. Adrenals/Urinary Tract: Normal adrenal glands. Stable  and negative noncontrast kidneys. No nephrolithiasis or hydronephrosis. Visible ureters are negative. Unremarkable bladder. Stomach/Bowel: Diverticulosis of the descending colon with no active inflammation. Retained gas and/or stool in the large bowel elsewhere. Normal appendix on series 2, appendix is stable and noninflamed on series 2, image 62. Gas and some flocculated material in the distal small bowel and terminal ileum but no dilated bowel loops. Chronic Roux-en-Y type gastric bypass appears stable. No free air, free fluid, mesenteric inflammation. Vascular/Lymphatic: Normal caliber abdominal aorta. No calcified atherosclerosis or lymphadenopathy. Reproductive: Negative noncontrast appearance. Other: No pelvic free fluid. Musculoskeletal: Negative. IMPRESSION: 1. No acute or inflammatory process identified in the non-contrast abdomen or pelvis. 2. Chronic postoperative changes including Roux-en-Y type gastric bypass. Electronically Signed   By: Genevie Ann M.D.   On: 12/08/2020 06:19    ____________________________________________   PROCEDURES  Procedure(s) performed (including Critical Care):  .1-3 Lead EKG Interpretation Performed by: Paulette Blanch, MD Authorized by: Paulette Blanch, MD     Interpretation: normal     ECG rate:  71   ECG rate assessment: normal     Rhythm: sinus rhythm     Ectopy: none     Conduction: normal   Comments:     Placed on cardiac monitor to evaluate for arrhythmias   ____________________________________________   INITIAL IMPRESSION / ASSESSMENT AND PLAN / ED COURSE  As part of my medical decision making, I reviewed the following data within the electronic MEDICAL RECORD NUMBER History obtained from family, Nursing notes  reviewed and incorporated, Labs reviewed, Old chart reviewed, Radiograph reviewed, and Notes from prior ED visits     50 year old female presenting with vomiting, dehydration, generalized weakness. Differential diagnosis includes, but is not limited to, ovarian cyst, ovarian torsion, acute appendicitis, diverticulitis, urinary tract infection/pyelonephritis, endometriosis, bowel obstruction, colitis, renal colic, gastroenteritis, hernia, fibroids, etc.   Laboratory results notable for mild hypokalemia, ketonuria.  Will initiate IV fluid resuscitation, IV KCl, Morphine/Zofran/Pepcid.  Obtain CT abdomen/pelvis.  Will reassess.  Clinical Course as of 12/08/20 0646  Wed Dec 08, 2020  0643 Patient feeling better.  Updated her on CT results.  She clarified that she self DC'd Maunjaro 1 week ago. Will PO challenge with Pedialyte.  IV potassium infusing.  Care will be transferred to the oncoming provider.  If patient is able to tolerate p.o. then she may be discharged home with prescription for as needed Zofran. [JS]    Clinical Course User Index [JS] Paulette Blanch, MD     ____________________________________________   FINAL CLINICAL IMPRESSION(S) / ED DIAGNOSES  Final diagnoses:  Abdominal pain, unspecified abdominal location  Nausea and vomiting, unspecified vomiting type  Hypokalemia  Dehydration     ED Discharge Orders     None        Note:  This document was prepared using Dragon voice recognition software and may include unintentional dictation errors.    Paulette Blanch, MD 12/08/20 [phone]

## 2020-12-08 NOTE — Discharge Instructions (Addendum)
1.  Take Zofran or Phenergan as needed for nausea/vomiting (not both). 2.  Drink plenty of fluids daily, taking small amounts at at time. 3.  Return to the ER for worsening symptoms, persistent vomiting, difficulty breathing or other concerns.

## 2020-12-08 NOTE — ED Triage Notes (Addendum)
Pt arrived via POV with reports of poor PO intake, pt states she has been vomiting  x 2 weeks, pt states she is dehydrated.  Pt states when she tried drinking a protein shake today and threw it up.  COVID + on 11/24

## 2020-12-08 NOTE — ED Notes (Signed)
Patient given pedialyte for PO challenge.

## 2020-12-08 NOTE — ED Provider Notes (Signed)
-----------------------------------------   9:16 AM on 12/08/2020 -----------------------------------------  I took over care of this patient from Dr. Beather Arbour.  She is tolerating p.o., and was able to take some Pedialyte without vomiting.  She is feeling significantly better and feels comfortable going home.  She is stable for discharge at this time.  I counseled the patient on the results of the work-up and plan of care.  The patient states that Zofran has not been working well for her at home, and asked for an alternative.  I have prescribed a small quantity of Phenergan.  I gave the patient very thorough return precautions and she expressed understanding.   Arta Silence, MD 12/08/20 [phone]

## 2020-12-15 ENCOUNTER — Encounter: Payer: Self-pay | Admitting: Family Medicine

## 2020-12-15 ENCOUNTER — Ambulatory Visit (INDEPENDENT_AMBULATORY_CARE_PROVIDER_SITE_OTHER): Payer: 59 | Admitting: Family Medicine

## 2020-12-15 ENCOUNTER — Other Ambulatory Visit: Payer: Self-pay | Admitting: Family Medicine

## 2020-12-15 ENCOUNTER — Other Ambulatory Visit: Payer: Self-pay

## 2020-12-15 VITALS — BP 90/60 | HR 75 | Temp 98.4°F | Ht 65.0 in | Wt 213.0 lb

## 2020-12-15 DIAGNOSIS — Z13220 Encounter for screening for lipoid disorders: Secondary | ICD-10-CM

## 2020-12-15 DIAGNOSIS — Z1231 Encounter for screening mammogram for malignant neoplasm of breast: Secondary | ICD-10-CM

## 2020-12-15 DIAGNOSIS — E039 Hypothyroidism, unspecified: Secondary | ICD-10-CM

## 2020-12-15 DIAGNOSIS — Z23 Encounter for immunization: Secondary | ICD-10-CM

## 2020-12-15 DIAGNOSIS — Z0001 Encounter for general adult medical examination with abnormal findings: Secondary | ICD-10-CM | POA: Diagnosis not present

## 2020-12-15 DIAGNOSIS — R809 Proteinuria, unspecified: Secondary | ICD-10-CM

## 2020-12-15 DIAGNOSIS — Z1159 Encounter for screening for other viral diseases: Secondary | ICD-10-CM

## 2020-12-15 DIAGNOSIS — E669 Obesity, unspecified: Secondary | ICD-10-CM

## 2020-12-15 DIAGNOSIS — Z8616 Personal history of COVID-19: Secondary | ICD-10-CM | POA: Insufficient documentation

## 2020-12-15 LAB — COMPREHENSIVE METABOLIC PANEL
ALT: 13 U/L (ref 0–35)
AST: 17 U/L (ref 0–37)
Albumin: 4.3 g/dL (ref 3.5–5.2)
Alkaline Phosphatase: 77 U/L (ref 39–117)
BUN: 15 mg/dL (ref 6–23)
CO2: 28 mEq/L (ref 19–32)
Calcium: 9.4 mg/dL (ref 8.4–10.5)
Chloride: 104 mEq/L (ref 96–112)
Creatinine, Ser: 0.89 mg/dL (ref 0.40–1.20)
GFR: 74.38 mL/min (ref >60.00)
Glucose, Bld: 86 mg/dL (ref 70–99)
Potassium: 3 mEq/L — ABNORMAL LOW (ref 3.5–5.1)
Sodium: 140 mEq/L (ref 135–145)
Total Bilirubin: 0.6 mg/dL (ref 0.2–1.2)
Total Protein: 7 g/dL (ref 6.0–8.3)

## 2020-12-15 LAB — LIPID PANEL
Cholesterol: 145 mg/dL (ref 0–200)
HDL: 42.5 mg/dL (ref >39.00)
LDL Cholesterol: 80 mg/dL (ref 0–99)
NonHDL: 102.24
Total CHOL/HDL Ratio: 3
Triglycerides: 110 mg/dL (ref 0.0–149.0)
VLDL: 22 mg/dL (ref 0.0–40.0)

## 2020-12-15 LAB — TSH: TSH: 2.9 u[IU]/mL (ref 0.35–5.50)

## 2020-12-15 LAB — HEMOGLOBIN A1C: Hgb A1c MFr Bld: 5.9 % (ref 4.6–6.5)

## 2020-12-15 NOTE — Assessment & Plan Note (Signed)
Physical exam completed.  I encouraged healthy diet and exercise.  Advise she needs to really monitor how much the mounjaro reduces her appetite.  If it is excessive she needs to discontinue this.  She will continue to follow with her weight management specialist.  She will have her mammogram as scheduled.  She will continue to follow with GYN.  Flu vaccine given today.  I encouraged her to get her COVID-vaccine by the end of 90 days of having had COVID.  I encouraged her to see the eye doctor as well.  Lab work as outlined.

## 2020-12-15 NOTE — Assessment & Plan Note (Signed)
Appears to have recovered quite well.  Her ear discomfort is likely related to eustachian tube dysfunction.  I encouraged her to restart Flonase.

## 2020-12-15 NOTE — Progress Notes (Signed)
Whitney Rumps, MD Phone: [phone]  Whitney Hill is a 50 y.o. female who presents today for CPE.  Diet: Mostly protein, some fruit, some carbs Exercise: None Patient reports she is on mounjaro through [facility] for weight loss.  She had quite a bit of decreased appetite and nausea when she was on the 10 mg dose and was sick with COVID.  She notes over the last week or 2 she is felt fine and has increased to the 12.5 mg dose. Pap smear: NILM and negative HPV on 04/02/2017 with GYN Colonoscopy: 06/14/2018 with 5-year recall Mammogram: She is scheduled for February Family history-  Colon cancer: No  Breast cancer: Sister  Ovarian cancer: No Menses: Postmenopausal Vaccines-   Flu: due  Tetanus: UTD  Shingles: has completed one vaccine  COVID19: due for booster HIV screening: UTD Hep C Screening: due Tobacco use: no Alcohol use: no Illicit Drug use: no Dentist: yes Ophthalmology: due  History of COVID-19: Patient reports she recently had COVID-19 over Thanksgiving.  She ended up in the emergency department following this with vomiting and dehydration.  This was likely related to poor oral intake with being on mounjaro and being ill at the same time.  She was found to have low potassium.  She feels better at this time with the exception of occasional ear pain when she is outside.  Active Ambulatory Problems    Diagnosis Date Noted   Hypothyroidism 10/01/2014   Left knee pain 10/01/2014   Cramps, extremity 10/01/2014   Allergic rhinitis 10/01/2014   S/P gastric bypass 10/01/2014   Iron deficiency anemia 01/14/2015   Plantar fasciitis 05/13/2015   Knee osteochondritis dessicans 05/25/2015   Status post left partial knee replacement, medial aspect 07/09/2015   Obesity 07/09/2015   Skin nodule 09/20/2015   Special screening for malignant neoplasms, colon 10/14/2015   Late period 11/24/2015   Iron deficiency 02/04/2016   Abnormal uterine bleeding 02/07/2016    Constipation 04/28/2016   Right ankle pain 04/28/2016   Hives 03/13/2017   Arthralgia 09/18/2017   Tick bite 09/18/2017   Vitamin D deficiency 09/18/2017   Hashimoto's disease 09/18/2017   Vision loss 09/18/2017   Thiamine deficiency 01/17/2018   Left elbow pain 02/05/2018   Hypokalemia 02/05/2018   Left arm weakness 02/20/2018   Menorrhagia with irregular cycle 06/04/2018   Polyp of sigmoid colon    History of abnormal cervical Pap smear 07/09/2018   Nocturia 08/05/2019   Hamstring strain 08/05/2019   COVID-19 01/19/2020   Proteinuria 01/19/2020   Arthritis of right knee 08/21/2019   Patellofemoral syndrome of right knee 02/23/2020   Osteoarthritis of ankle and foot 08/21/2019   Encounter for general adult medical examination with abnormal findings 12/15/2020   History of COVID-19 12/15/2020   Resolved Ambulatory Problems    Diagnosis Date Noted   Surgery, elective, encounter for clearance 01/14/2015   Preoperative evaluation to rule out surgical contraindication 04/12/2015   Acute upper respiratory infection 11/24/2015   Candidal intertrigo 07/18/2016   Bug bites 07/18/2016   Vitamin B1 deficiency 09/18/2017   Past Medical History:  Diagnosis Date   Arthritis    Complication of anesthesia    DVT (deep venous thrombosis) (HCC)    Gastric ulcer    Headache    Heterozygous factor V Leiden mutation ([place])    Hypothyroid     Family History  Problem Relation Age of Onset   Arthritis Other        parent  Hyperlipidemia Other        parent   Stroke Other        parent   Hypertension Other        parent   Kidney disease Other        parent, grandparent   Diabetes Other        parent, grandparent   Renal cancer Other        parent   Breast cancer Maternal Aunt 8       BRACA + dx twice age 57 and 73   Breast cancer Maternal Grandmother        late 12's    Social History   Socioeconomic History   Marital status: Married    Spouse name: Not on file    Number of children: Not on file   Years of education: Not on file   Highest education level: Not on file  Occupational History   Not on file  Tobacco Use   Smoking status: Never   Smokeless tobacco: Never  Vaping Use   Vaping Use: Never used  Substance and Sexual Activity   Alcohol use: No    Alcohol/week: 0.0 standard drinks   Drug use: No   Sexual activity: Not on file  Other Topics Concern   Not on file  Social History Narrative   Not on file   Social Determinants of Health   Financial Resource Strain: Not on file  Food Insecurity: Not on file  Transportation Needs: Not on file  Physical Activity: Not on file  Stress: Not on file  Social Connections: Not on file  Intimate Partner Violence: Not on file    ROS  General:  Negative for nexplained weight loss, fever Skin: Negative for new or changing mole, sore that won't heal HEENT: Positive for tinnitus, negative for trouble hearing, trouble seeing, mouth sores, hoarseness, change in voice, dysphagia. CV:  Negative for chest pain, dyspnea, edema, palpitations Resp: Negative for cough, dyspnea, hemoptysis GI: Negative for nausea, vomiting, diarrhea, constipation, abdominal pain, melena, hematochezia. GU: Positive for urine incontinence, frequent urination, negative for dysuria, urinary hesitance, hematuria, vaginal or penile discharge, sexual difficulty, lumps in testicle or breasts MSK: Negative for muscle cramps or aches, joint pain or swelling Neuro: Negative for headaches, weakness, numbness, dizziness, passing out/fainting Psych: Negative for depression, anxiety, memory problems  Objective  Physical Exam Vitals:   12/15/20 1131  BP: 90/60  Pulse: 75  Temp: 98.4 F (36.9 C)  SpO2: 98%    BP Readings from Last 3 Encounters:  12/15/20 90/60  12/08/20 105/71  04/23/20 96/80   Wt Readings from Last 3 Encounters:  12/15/20 213 lb (96.6 kg)  12/08/20 208 lb (94.3 kg)  04/23/20 241 lb (109.3 kg)     Physical Exam Constitutional:      General: She is not in acute distress.    Appearance: She is not diaphoretic.  HENT:     Head: Normocephalic and atraumatic.     Right Ear: Tympanic membrane normal.     Left Ear: Tympanic membrane normal.  Cardiovascular:     Rate and Rhythm: Normal rate and regular rhythm.     Heart sounds: Normal heart sounds.  Pulmonary:     Effort: Pulmonary effort is normal.     Breath sounds: Normal breath sounds.  Abdominal:     General: Bowel sounds are normal. There is no distension.     Palpations: Abdomen is soft.     Tenderness: There is no  abdominal tenderness. There is no guarding or rebound.  Musculoskeletal:     Right lower leg: No edema.     Left lower leg: No edema.  Lymphadenopathy:     Cervical: No cervical adenopathy.  Skin:    General: Skin is warm and dry.  Neurological:     Mental Status: She is alert.  Psychiatric:        Mood and Affect: Mood normal.     Assessment/Plan:   Problem List Items Addressed This Visit     Encounter for general adult medical examination with abnormal findings - Primary    Physical exam completed.  I encouraged healthy diet and exercise.  Advise she needs to really monitor how much the mounjaro reduces her appetite.  If it is excessive she needs to discontinue this.  She will continue to follow with her weight management specialist.  She will have her mammogram as scheduled.  She will continue to follow with GYN.  Flu vaccine given today.  I encouraged her to get her COVID-vaccine by the end of 90 days of having had COVID.  I encouraged her to see the eye doctor as well.  Lab work as outlined.      History of COVID-19    Appears to have recovered quite well.  Her ear discomfort is likely related to eustachian tube dysfunction.  I encouraged her to restart Flonase.      Hypothyroidism   Relevant Orders   TSH   Proteinuria    Noted on recent urine from the ED.  We will quantify this with a urine  protein creatinine ratio.      Relevant Orders   Protein / creatinine ratio, urine   Other Visit Diagnoses     Need for immunization against influenza       Relevant Orders   Flu Vaccine QUAD 25moIM (Fluarix, Fluzone & Alfiuria Quad PF) (Completed)   Lipid screening       Relevant Orders   Comp Met (CMET)   Lipid panel   Obesity (BMI 35.0-39.9 without comorbidity)       Relevant Orders   HgB A1c   Need for hepatitis C screening test       Relevant Orders   Hepatitis C Antibody       Return in about 1 year (around 12/15/2021) for CPE.  This visit occurred during the SARS-CoV-2 public health emergency.  Safety protocols were in place, including screening questions prior to the visit, additional usage of staff PPE, and extensive cleaning of exam room while observing appropriate contact time as indicated for disinfecting solutions.    ETommi Rumps MD [facility]

## 2020-12-15 NOTE — Patient Instructions (Signed)
Nice to see you. We will check labs today. Please get the COVID booster when you are ready to by the end of 90 days after your COVID infection.

## 2020-12-15 NOTE — Assessment & Plan Note (Signed)
Noted on recent urine from the ED.  We will quantify this with a urine protein creatinine ratio.

## 2020-12-16 ENCOUNTER — Other Ambulatory Visit: Payer: Self-pay | Admitting: Family Medicine

## 2020-12-16 DIAGNOSIS — E876 Hypokalemia: Secondary | ICD-10-CM

## 2020-12-16 LAB — HEPATITIS C ANTIBODY
Hepatitis C Ab: NONREACTIVE
SIGNAL TO CUT-OFF: 0.06 (ref <1.00)

## 2020-12-16 MED ORDER — POTASSIUM CHLORIDE CRYS ER 20 MEQ PO TBCR: 40.0000 meq | EXTENDED_RELEASE_TABLET | Freq: Every day | ORAL | 0 refills | Status: DC

## 2020-12-18 LAB — PROTEIN / CREATININE RATIO, URINE
Creatinine, Urine: 362 mg/dL — ABNORMAL HIGH (ref 20–275)
Protein/Creat Ratio: 91 mg/g creat (ref 24–184)
Protein/Creatinine Ratio: 0.091 mg/mg creat (ref 0.024–0.184)
Total Protein, Urine: 33 mg/dL — ABNORMAL HIGH (ref 5–24)

## 2021-01-06 ENCOUNTER — Encounter: Payer: Self-pay | Admitting: Internal Medicine

## 2021-01-15 ENCOUNTER — Encounter: Payer: Self-pay | Admitting: Internal Medicine

## 2021-01-17 ENCOUNTER — Telehealth: Payer: Managed Care, Other (non HMO) | Admitting: Physician Assistant

## 2021-01-17 DIAGNOSIS — B9689 Other specified bacterial agents as the cause of diseases classified elsewhere: Secondary | ICD-10-CM | POA: Diagnosis not present

## 2021-01-17 DIAGNOSIS — J019 Acute sinusitis, unspecified: Secondary | ICD-10-CM | POA: Diagnosis not present

## 2021-01-17 MED ORDER — DOXYCYCLINE HYCLATE 100 MG PO TABS: 100.0000 mg | ORAL_TABLET | Freq: Two times a day (BID) | ORAL | 0 refills | Status: DC

## 2021-01-17 MED ORDER — IPRATROPIUM BROMIDE 0.03 % NA SOLN: 2.0000 | Freq: Two times a day (BID) | NASAL | 0 refills | Status: DC

## 2021-01-17 NOTE — Progress Notes (Signed)
E-Visit for Sinus Problems  We are sorry that you are not feeling well.  Here is how we plan to help!  Based on what you have shared with me it looks like you have sinusitis.  Sinusitis is inflammation and infection in the sinus cavities of the head.  Based on your presentation I believe you most likely have Acute Bacterial Sinusitis.  This is an infection caused by bacteria and is treated with antibiotics. I have prescribed Doxycycline 100mg  by mouth twice a day for 10 days. I will also prescribe a nasal spray called Ipratropium Bromide. You may use an oral decongestant such as Mucinex D or if you have glaucoma or high blood pressure use plain Mucinex. Saline nasal spray help and can safely be used as often as needed for congestion.  If you develop worsening sinus pain, fever or notice severe headache and vision changes, or if symptoms are not better after completion of antibiotic, please schedule an appointment with a health care provider.    Sinus infections are not as easily transmitted as other respiratory infection, however we still recommend that you avoid close contact with loved ones, especially the very young and elderly.  Remember to wash your hands thoroughly throughout the day as this is the number one way to prevent the spread of infection!  Home Care: Only take medications as instructed by your medical team. Complete the entire course of an antibiotic. Do not take these medications with alcohol. A steam or ultrasonic humidifier can help congestion.  You can place a towel over your head and breathe in the steam from hot water coming from a faucet. Avoid close contacts especially the very young and the elderly. Cover your mouth when you cough or sneeze. Always remember to wash your hands.  Get Help Right Away If: You develop worsening fever or sinus pain. You develop a severe head ache or visual changes. Your symptoms persist after you have completed your treatment plan.  Make sure  you Understand these instructions. Will watch your condition. Will get help right away if you are not doing well or get worse.  Thank you for choosing an e-visit.  Your e-visit answers were reviewed by a board certified advanced clinical practitioner to complete your personal care plan. Depending upon the condition, your plan could have included both over the counter or prescription medications.  Please review your pharmacy choice. Make sure the pharmacy is open so you can pick up prescription now. If there is a problem, you may contact your provider through [employer] and have the prescription routed to another pharmacy.  Your safety is important to [place]. If you have drug allergies check your prescription carefully.   For the next 24 hours you can use MyChart to ask questions about today's visit, request a non-urgent call back, or ask for a work or school excuse. You will get an email in the next two days asking about your experience. I hope that your e-visit has been valuable and will speed your recovery.  I provided 5 minutes of non face-to-face time during this encounter for chart review and documentation.

## 2021-01-19 ENCOUNTER — Other Ambulatory Visit: Payer: Self-pay | Admitting: Physician Assistant

## 2021-01-19 DIAGNOSIS — J019 Acute sinusitis, unspecified: Secondary | ICD-10-CM

## 2021-01-19 DIAGNOSIS — B9689 Other specified bacterial agents as the cause of diseases classified elsewhere: Secondary | ICD-10-CM

## 2021-02-10 ENCOUNTER — Ambulatory Visit
Admission: RE | Admit: 2021-02-10 | Discharge: 2021-02-10 | Disposition: A | Discharge status: Home / Self Care | Payer: 59 | Source: Ambulatory Visit | Attending: Family Medicine | Admitting: Family Medicine

## 2021-02-10 ENCOUNTER — Other Ambulatory Visit: Payer: Self-pay

## 2021-02-10 ENCOUNTER — Encounter: Payer: Self-pay | Admitting: Internal Medicine

## 2021-02-10 DIAGNOSIS — Z1231 Encounter for screening mammogram for malignant neoplasm of breast: Secondary | ICD-10-CM | POA: Insufficient documentation

## 2021-02-25 ENCOUNTER — Inpatient Hospital Stay: Payer: 59

## 2021-04-08 ENCOUNTER — Other Ambulatory Visit: Payer: Self-pay | Admitting: Oncology

## 2021-04-08 DIAGNOSIS — M222X1 Patellofemoral disorders, right knee: Secondary | ICD-10-CM | POA: Diagnosis not present

## 2021-04-08 DIAGNOSIS — M19171 Post-traumatic osteoarthritis, right ankle and foot: Secondary | ICD-10-CM | POA: Diagnosis not present

## 2021-06-08 ENCOUNTER — Encounter: Payer: Self-pay | Admitting: Family Medicine

## 2021-06-08 ENCOUNTER — Telehealth (INDEPENDENT_AMBULATORY_CARE_PROVIDER_SITE_OTHER): Payer: 59 | Admitting: Family Medicine

## 2021-06-08 DIAGNOSIS — Z8711 Personal history of peptic ulcer disease: Secondary | ICD-10-CM | POA: Diagnosis not present

## 2021-06-08 DIAGNOSIS — R350 Frequency of micturition: Secondary | ICD-10-CM | POA: Diagnosis not present

## 2021-06-08 DIAGNOSIS — N840 Polyp of corpus uteri: Secondary | ICD-10-CM | POA: Insufficient documentation

## 2021-06-08 DIAGNOSIS — M79622 Pain in left upper arm: Secondary | ICD-10-CM

## 2021-06-08 DIAGNOSIS — R0781 Pleurodynia: Secondary | ICD-10-CM | POA: Insufficient documentation

## 2021-06-08 NOTE — Progress Notes (Signed)
Virtual Visit via video Note  This visit type was conducted due to national recommendations for restrictions regarding the COVID-19 pandemic (e.g. social distancing).  This format is felt to be most appropriate for this patient at this time.  All issues noted in this document were discussed and addressed.  No physical exam was performed (except for noted visual exam findings with Video Visits).   I connected with Whitney Hill today at 12:00 PM EDT by a video enabled telemedicine application or telephone and verified that I am speaking with the correct person using two identifiers. Location patient: home Location provider: home office Persons participating in the virtual visit: patient, provider  I discussed the limitations, risks, security and privacy concerns of performing an evaluation and management service by telephone and the availability of in person appointments. I also discussed with the patient that there may be a patient responsible charge related to this service. The patient expressed understanding and agreed to proceed.   Reason for visit: same day visit  HPI: Axillary pain: Patient notes onset of symptoms 3 to 4 weeks ago with a burning sensation underneath her left breast when she would reach for things.  That subsequently resolved.  1 week ago while she was taking a shower she noted when she took her bra off she developed some discomfort in her left axilla with some soreness.  She notes at times she has shooting pains if she turns a certain way.  She notes her daughter felt in the area and thinks maybe she felt something though was not sure.  She notes no discomfort in her breast tissue.  She notes no injury to cause this issue.  Patient does report her sister has breast cancer and had similar discomfort prior to her diagnosis.  History of gastric ulcer: She notes at times she gets a hunger sensation in her abdomen that does not improve with eating.  Pepcid is helpful for  this.  She has not seen GI since 2020.  Urinary frequency: She reports increased urinary frequency when she was traveling recently.  She has no dysuria.  She does not feel as though she has a UTI.  She notes no sense of fluid intake.   ROS: See pertinent positives and negatives per HPI.  Past Medical History:  Diagnosis Date   Arthritis    OA   Complication of anesthesia    "ITCHING TO FACE, BENADRYL HELP BUT DROPS BLOOD PRESSURE"   DVT (deep venous thrombosis) (HCC)    following first knee surgery in 2011   Gastric ulcer    Headache    Heterozygous factor V Leiden mutation ([place])    Hypothyroid    Iron deficiency anemia     Past Surgical History:  Procedure Laterality Date   [place]   COLONOSCOPY WITH PROPOFOL N/A 06/14/2018   Procedure: COLONOSCOPY WITH BIOPSY;  Surgeon: Lucilla Lame, MD;  Location: [place];  Service: Endoscopy;  Laterality: N/A;   DILATION AND CURETTAGE OF UTERUS     1994, 2001   ESOPHAGOGASTRODUODENOSCOPY (EGD) WITH PROPOFOL N/A 06/14/2018   Procedure: ESOPHAGOGASTRODUODENOSCOPY (EGD) WITH PROPOFOL;  Surgeon: Lucilla Lame, MD;  Location: [place];  Service: Endoscopy;  Laterality: N/A;   FOREIGN BODY REMOVAL N/A 06/14/2018   Procedure: FOREIGN BODY REMOVAL;  Surgeon: Lucilla Lame, MD;  Location: [place];  Service: Endoscopy;  Laterality: N/A;  Staple Removal from Stomach  GASTRIC BYPASS     2012, revision 2013   KNEE ARTHROSCOPY     2011, 2013, 2015   POLYPECTOMY N/A 06/14/2018   Procedure: POLYPECTOMY;  Surgeon: Lucilla Lame, MD;  Location: [place];  Service: Endoscopy;  Laterality: N/A;   TOOTH EXTRACTION     2010   TOTAL KNEE ARTHROPLASTY Left 05/25/2015   Procedure: LEFT TOTAL KNEE ARTHROPLASTY;  Surgeon: Renette Butters, MD;  Location: [place];  Service: Orthopedics;  Laterality: Left;   TUBAL LIGATION     2004    Family History  Problem Relation  Age of Onset   Breast cancer Sister 59   Breast cancer Maternal Aunt 39       BRACA + dx twice age 34 and 72   Breast cancer Maternal Grandmother        late 74's   Arthritis Other        parent   Hyperlipidemia Other        parent   Stroke Other        parent   Hypertension Other        parent   Kidney disease Other        parent, grandparent   Diabetes Other        parent, grandparent   Renal cancer Other        parent    SOCIAL HX: Non-smoker   Current Outpatient Medications:    benzonatate (TESSALON PERLES) 100 MG capsule, Take 1 capsule (100 mg total) by mouth 3 (three) times daily as needed., Disp: 20 capsule, Rfl: 0   cyanocobalamin (,VITAMIN B-12,) 1000 MCG/ML injection, INJECT 1ML INTRAMUSCULARLY ONCE PER MONTH, Disp: 3 mL, Rfl: 1   diclofenac sodium (VOLTAREN) 1 % GEL, Apply 2 g topically 4 (four) times daily., Disp: , Rfl:    doxycycline (VIBRA-TABS) 100 MG tablet, Take 1 tablet (100 mg total) by mouth 2 (two) times daily., Disp: 20 tablet, Rfl: 0   EPINEPHrine 0.3 mg/0.3 mL IJ SOAJ injection, Inject into the muscle., Disp: , Rfl:    fluticasone (FLONASE) 50 MCG/ACT nasal spray, SPRAY 2 SPRAYS INTO EACH NOSTRIL EVERY DAY, Disp: 48 mL, Rfl: 2   folic acid (FOLVITE) 1 MG tablet, TAKE 1 TABLET BY MOUTH EVERY DAY, Disp: 90 tablet, Rfl: 1   ipratropium (ATROVENT) 0.03 % nasal spray, Place 2 sprays into both nostrils every 12 (twelve) hours., Disp: 30 mL, Rfl: 0   levothyroxine (SYNTHROID) 175 MCG tablet, Take 1 tablet (175 mcg total) by mouth daily before breakfast. (Patient taking differently: Take 150 mcg by mouth daily before breakfast.), Disp: 90 tablet, Rfl: 1   multivitamin-iron-minerals-folic acid (CENTRUM) chewable tablet, Chew 1 tablet by mouth daily., Disp: , Rfl:    Syringe/Needle, Disp, (SYRINGE 3CC/25GX1") 25G X 1" 3 ML MISC, 3 mLs by Does not apply route every 30 (thirty) days., Disp: 12 each, Rfl: 0   thiamine 100 MG tablet, Take 1 tablet (100 mg total) by  mouth daily., Disp: , Rfl:    tirzepatide (MOUNJARO) 2.5 MG/0.5ML Pen, INJECT 2.5 MG SUBCUTANEOUSLY ONCE WEEKLY., Disp: , Rfl:    potassium chloride SA (KLOR-CON M) 20 MEQ tablet, Take 2 tablets (40 mEq total) by mouth daily for 3 days., Disp: 6 tablet, Rfl: 0   promethazine (PHENERGAN) 12.5 MG tablet, Take 1 tablet (12.5 mg total) by mouth every 6 (six) hours as needed for up to 3 days for nausea or vomiting., Disp: 12 tablet, Rfl: 0  EXAM:  VITALS  per patient if applicable:  GENERAL: alert, oriented, appears well and in no acute distress  HEENT: atraumatic, conjunttiva clear, no obvious abnormalities on inspection of external nose and ears  NECK: normal movements of the head and neck  LUNGS: on inspection no signs of respiratory distress, breathing rate appears normal, no obvious gross SOB, gasping or wheezing  CV: no obvious cyanosis  MS: moves all visible extremities without noticeable abnormality  PSYCH/NEURO: pleasant and cooperative, no obvious depression or anxiety, speech and thought processing grossly intact  ASSESSMENT AND PLAN:  Discussed the following assessment and plan:  Problem List Items Addressed This Visit     Axillary pain, left    Discussed that this could be a muscle strain though we will get a diagnostic mammogram and ultrasound to rule out an underlying breast/lymph node issue.       Relevant Orders   MM DIAG BREAST TOMO UNI LEFT   US BREAST LTD UNI LEFT INC AXILLA   History of gastric ulcer    Patient has occasional symptoms possibly related to a gastric ulcer.  We will refer her back to GI.       Relevant Orders   Ambulatory referral to Gastroenterology   Urine frequency    Offered to have her come in for a urinalysis though she declined.  She will monitor symptoms and if she develops new symptoms she will let us know.        Return for as scheduled.   I discussed the assessment and treatment plan with the patient. The patient was  provided an opportunity to ask questions and all were answered. The patient agreed with the plan and demonstrated an understanding of the instructions.   The patient was advised to call back or seek an in-person evaluation if the symptoms worsen or if the condition fails to improve as anticipated.  Tommi Rumps, MD

## 2021-06-08 NOTE — Assessment & Plan Note (Signed)
Patient has occasional symptoms possibly related to a gastric ulcer.  We will refer her back to GI.

## 2021-06-08 NOTE — Assessment & Plan Note (Signed)
Discussed that this could be a muscle strain though we will get a diagnostic mammogram and ultrasound to rule out an underlying breast/lymph node issue.

## 2021-06-08 NOTE — Assessment & Plan Note (Signed)
Offered to have her come in for a urinalysis though she declined.  She will monitor symptoms and if she develops new symptoms she will let us know.

## 2021-06-10 ENCOUNTER — Ambulatory Visit
Admission: RE | Admit: 2021-06-10 | Discharge: 2021-06-10 | Disposition: A | Discharge status: Home / Self Care | Payer: 59 | Source: Ambulatory Visit | Attending: Family Medicine | Admitting: Family Medicine

## 2021-06-10 ENCOUNTER — Telehealth: Payer: Self-pay | Admitting: Family Medicine

## 2021-06-10 DIAGNOSIS — M79622 Pain in left upper arm: Secondary | ICD-10-CM | POA: Diagnosis not present

## 2021-06-10 DIAGNOSIS — N644 Mastodynia: Secondary | ICD-10-CM | POA: Diagnosis not present

## 2021-06-10 MED ORDER — TRAMADOL HCL 50 MG PO TABS: 50.0000 mg | ORAL_TABLET | Freq: Three times a day (TID) | ORAL | 0 refills | Status: AC | PRN

## 2021-06-10 NOTE — Telephone Encounter (Signed)
Pt called stating she is in a lot of pain still and the ultrasound and mammogram did not show anything

## 2021-06-10 NOTE — Telephone Encounter (Signed)
Tramadol sent to the pharmacy.

## 2021-06-10 NOTE — Telephone Encounter (Signed)
Noted.  This is likely related to muscle strain.  Can you confirm if she has a rash associated with the pain?  Can you also confirm if she has a history of seizures?  If she does not have a history of seizures and I will send in tramadol to use for the pain.

## 2021-06-22 ENCOUNTER — Encounter: Payer: Self-pay | Admitting: Family Medicine

## 2021-06-22 ENCOUNTER — Ambulatory Visit: Payer: 59 | Admitting: Family Medicine

## 2021-06-22 VITALS — BP 110/70 | HR 82 | Temp 98.1°F | Ht 65.0 in | Wt 183.8 lb

## 2021-06-22 DIAGNOSIS — R0781 Pleurodynia: Secondary | ICD-10-CM | POA: Diagnosis not present

## 2021-06-22 DIAGNOSIS — L299 Pruritus, unspecified: Secondary | ICD-10-CM | POA: Insufficient documentation

## 2021-06-22 NOTE — Patient Instructions (Signed)
Nice to see you. We will schedule you for an x-ray and we will contact you with the results.

## 2021-06-22 NOTE — Assessment & Plan Note (Signed)
This has improved some.  I suspect rib strain as the cause of her symptoms.  She had a negative mammogram and ultrasound.  There are no palpable lesions in the area of concern.  We will get a x-ray of her ribs given this has been persistent.  If it does not continue to stay improved she will let us know.

## 2021-06-22 NOTE — Progress Notes (Signed)
Whitney Rumps, MD Phone: [phone]  ARTHELIA CALLICOTT is a 51 y.o. female who presents today for follow-up.  Left axillary pain: Patient notes this has improved today though up until today had continued to be an issue.  She does note some soreness in her left mid axillary area that does radiate around underneath her left breast.  When it was at its worst the pain was going into her left breast.  She had a diagnostic mammogram and ultrasound that did not reveal any evidence of malignancy.  She notes the pain is worse when she does not wear a bra.  Tramadol helped a little bit though she stopped taking it because it did not resolve the pain.  Itchy skin: This has been an intermittent ongoing issue throughout her life.  She notes that she has spots here and there that itch.  She notes it does itch after she gets out of the shower. Prior hepatic function has been acceptable. She has an appointment for labs to check her cbc later this week.   Social History   Tobacco Use  Smoking Status Never  Smokeless Tobacco Never    Current Outpatient Medications on File Prior to Visit  Medication Sig Dispense Refill   benzonatate (TESSALON PERLES) 100 MG capsule Take 1 capsule (100 mg total) by mouth 3 (three) times daily as needed. 20 capsule 0   cyanocobalamin (,VITAMIN B-12,) 1000 MCG/ML injection INJECT 1ML INTRAMUSCULARLY ONCE PER MONTH 3 mL 1   diclofenac sodium (VOLTAREN) 1 % GEL Apply 2 g topically 4 (four) times daily.     doxycycline (VIBRA-TABS) 100 MG tablet Take 1 tablet (100 mg total) by mouth 2 (two) times daily. 20 tablet 0   EPINEPHrine 0.3 mg/0.3 mL IJ SOAJ injection Inject into the muscle.     fluticasone (FLONASE) 50 MCG/ACT nasal spray SPRAY 2 SPRAYS INTO EACH NOSTRIL EVERY DAY 48 mL 2   folic acid (FOLVITE) 1 MG tablet TAKE 1 TABLET BY MOUTH EVERY DAY 90 tablet 1   ipratropium (ATROVENT) 0.03 % nasal spray Place 2 sprays into both nostrils every 12 (twelve) hours. 30 mL 0    levothyroxine (SYNTHROID) 175 MCG tablet Take 1 tablet (175 mcg total) by mouth daily before breakfast. (Patient taking differently: Take 150 mcg by mouth daily before breakfast.) 90 tablet 1   multivitamin-iron-minerals-folic acid (CENTRUM) chewable tablet Chew 1 tablet by mouth daily.     Syringe/Needle, Disp, (SYRINGE 3CC/25GX1") 25G X 1" 3 ML MISC 3 mLs by Does not apply route every 30 (thirty) days. 12 each 0   thiamine 100 MG tablet Take 1 tablet (100 mg total) by mouth daily.     tirzepatide (MOUNJARO) 2.5 MG/0.5ML Pen INJECT 2.5 MG SUBCUTANEOUSLY ONCE WEEKLY.     potassium chloride SA (KLOR-CON M) 20 MEQ tablet Take 2 tablets (40 mEq total) by mouth daily for 3 days. 6 tablet 0   promethazine (PHENERGAN) 12.5 MG tablet Take 1 tablet (12.5 mg total) by mouth every 6 (six) hours as needed for up to 3 days for nausea or vomiting. 12 tablet 0   No current facility-administered medications on file prior to visit.     ROS see history of present illness  Objective  Physical Exam Vitals:   06/22/21 1453  BP: 110/70  Pulse: 82  Temp: 98.1 F (36.7 C)  SpO2: 98%    BP Readings from Last 3 Encounters:  06/22/21 110/70  12/15/20 90/60  12/08/20 105/71   Wt Readings from Last  3 Encounters:  06/22/21 183 lb 12.8 oz (83.4 kg)  06/08/21 213 lb (96.6 kg)  12/15/20 213 lb (96.6 kg)    Physical Exam Chest:        Assessment/Plan: Please see individual problem list.  Problem List Items Addressed This Visit     Itchy skin    Patient does have chronic skin allergy related issues.  Suspect this is related to that.  Prior hepatic function panels have been acceptable.  She will have her CBC drawn through the cancer center later this week.      Rib pain on left side - Primary    This has improved some.  I suspect rib strain as the cause of her symptoms.  She had a negative mammogram and ultrasound.  There are no palpable lesions in the area of concern.  We will get a x-ray of her  ribs given this has been persistent.  If it does not continue to stay improved she will let us know.      Relevant Orders   DG Ribs Unilateral Left     Return in 2 days (on 06/24/2021) for X-ray.   Whitney Rumps, MD [place]

## 2021-06-22 NOTE — Assessment & Plan Note (Signed)
Patient does have chronic skin allergy related issues.  Suspect this is related to that.  Prior hepatic function panels have been acceptable.  She will have her CBC drawn through the cancer center later this week.

## 2021-06-24 ENCOUNTER — Other Ambulatory Visit: Payer: 59

## 2021-06-24 ENCOUNTER — Inpatient Hospital Stay: Payer: 59 | Admitting: Nurse Practitioner

## 2021-06-24 ENCOUNTER — Inpatient Hospital Stay: Payer: 59

## 2021-06-28 ENCOUNTER — Inpatient Hospital Stay: Payer: 59

## 2021-06-28 ENCOUNTER — Inpatient Hospital Stay: Payer: 59 | Admitting: Nurse Practitioner

## 2021-06-28 ENCOUNTER — Telehealth: Payer: Self-pay | Admitting: Oncology

## 2021-06-28 NOTE — Telephone Encounter (Signed)
patient called to cancel appointment. she is sick. she will call back to reschedule

## 2021-06-29 ENCOUNTER — Ambulatory Visit: Payer: 59 | Admitting: Family Medicine

## 2021-07-27 DIAGNOSIS — M13861 Other specified arthritis, right knee: Secondary | ICD-10-CM | POA: Diagnosis not present

## 2021-07-27 DIAGNOSIS — M25561 Pain in right knee: Secondary | ICD-10-CM | POA: Diagnosis not present

## 2021-09-07 DIAGNOSIS — M25571 Pain in right ankle and joints of right foot: Secondary | ICD-10-CM | POA: Diagnosis not present

## 2021-09-07 DIAGNOSIS — M25561 Pain in right knee: Secondary | ICD-10-CM | POA: Diagnosis not present

## 2021-09-12 DIAGNOSIS — Z87821 Personal history of retained foreign body fully removed: Secondary | ICD-10-CM | POA: Diagnosis not present

## 2021-09-12 DIAGNOSIS — M25571 Pain in right ankle and joints of right foot: Secondary | ICD-10-CM | POA: Diagnosis not present

## 2021-09-16 DIAGNOSIS — S83206A Unspecified tear of unspecified meniscus, current injury, right knee, initial encounter: Secondary | ICD-10-CM | POA: Diagnosis not present

## 2021-09-21 HISTORY — PX: KNEE SURGERY: SHX244

## 2021-10-19 ENCOUNTER — Other Ambulatory Visit: Payer: Self-pay | Admitting: Oncology

## 2021-10-20 DIAGNOSIS — S83231A Complex tear of medial meniscus, current injury, right knee, initial encounter: Secondary | ICD-10-CM | POA: Diagnosis not present

## 2021-10-20 DIAGNOSIS — M65861 Other synovitis and tenosynovitis, right lower leg: Secondary | ICD-10-CM | POA: Diagnosis not present

## 2021-10-20 DIAGNOSIS — M659 Synovitis and tenosynovitis, unspecified: Secondary | ICD-10-CM | POA: Diagnosis not present

## 2021-10-20 DIAGNOSIS — Z9682 Presence of neurostimulator: Secondary | ICD-10-CM | POA: Diagnosis not present

## 2021-10-20 DIAGNOSIS — M2241 Chondromalacia patellae, right knee: Secondary | ICD-10-CM | POA: Diagnosis not present

## 2021-10-20 DIAGNOSIS — S83281A Other tear of lateral meniscus, current injury, right knee, initial encounter: Secondary | ICD-10-CM | POA: Diagnosis not present

## 2021-10-20 DIAGNOSIS — X58XXXA Exposure to other specified factors, initial encounter: Secondary | ICD-10-CM | POA: Diagnosis not present

## 2021-10-20 DIAGNOSIS — Z86718 Personal history of other venous thrombosis and embolism: Secondary | ICD-10-CM | POA: Diagnosis not present

## 2021-10-21 ENCOUNTER — Other Ambulatory Visit: Payer: 59

## 2021-11-01 ENCOUNTER — Ambulatory Visit: Admit: 2021-11-01 | Payer: 59 | Admitting: Orthopedic Surgery

## 2021-11-01 SURGERY — ARTHROSCOPY, KNEE, WITH MEDIAL MENISCECTOMY
Anesthesia: General | Site: Knee | Laterality: Right

## 2021-11-03 DIAGNOSIS — M25561 Pain in right knee: Secondary | ICD-10-CM | POA: Diagnosis not present

## 2021-11-03 DIAGNOSIS — M25661 Stiffness of right knee, not elsewhere classified: Secondary | ICD-10-CM | POA: Diagnosis not present

## 2021-11-23 DIAGNOSIS — M25661 Stiffness of right knee, not elsewhere classified: Secondary | ICD-10-CM | POA: Diagnosis not present

## 2021-11-23 DIAGNOSIS — M25561 Pain in right knee: Secondary | ICD-10-CM | POA: Diagnosis not present

## 2021-11-29 DIAGNOSIS — M25661 Stiffness of right knee, not elsewhere classified: Secondary | ICD-10-CM | POA: Diagnosis not present

## 2021-11-29 DIAGNOSIS — M25561 Pain in right knee: Secondary | ICD-10-CM | POA: Diagnosis not present

## 2021-11-30 DIAGNOSIS — M62838 Other muscle spasm: Secondary | ICD-10-CM | POA: Diagnosis not present

## 2021-11-30 DIAGNOSIS — M542 Cervicalgia: Secondary | ICD-10-CM | POA: Diagnosis not present

## 2021-11-30 DIAGNOSIS — M9902 Segmental and somatic dysfunction of thoracic region: Secondary | ICD-10-CM | POA: Diagnosis not present

## 2021-11-30 DIAGNOSIS — M9901 Segmental and somatic dysfunction of cervical region: Secondary | ICD-10-CM | POA: Diagnosis not present

## 2021-12-08 ENCOUNTER — Encounter: Payer: Self-pay | Admitting: Family Medicine

## 2021-12-08 ENCOUNTER — Ambulatory Visit (INDEPENDENT_AMBULATORY_CARE_PROVIDER_SITE_OTHER): Payer: 59 | Admitting: Family Medicine

## 2021-12-08 VITALS — BP 124/76 | HR 59 | Temp 97.7°F | Ht 65.0 in | Wt 194.4 lb

## 2021-12-08 DIAGNOSIS — R519 Headache, unspecified: Secondary | ICD-10-CM | POA: Diagnosis not present

## 2021-12-08 DIAGNOSIS — N644 Mastodynia: Secondary | ICD-10-CM | POA: Diagnosis not present

## 2021-12-08 DIAGNOSIS — M2550 Pain in unspecified joint: Secondary | ICD-10-CM | POA: Diagnosis not present

## 2021-12-08 DIAGNOSIS — E669 Obesity, unspecified: Secondary | ICD-10-CM | POA: Diagnosis not present

## 2021-12-08 LAB — COMPREHENSIVE METABOLIC PANEL
ALT: 22 U/L (ref 0–35)
AST: 15 U/L (ref 0–37)
Albumin: 4.2 g/dL (ref 3.5–5.2)
Alkaline Phosphatase: 84 U/L (ref 39–117)
BUN: 16 mg/dL (ref 6–23)
CO2: 27 mEq/L (ref 19–32)
Calcium: 8.9 mg/dL (ref 8.4–10.5)
Chloride: 108 mEq/L (ref 96–112)
Creatinine, Ser: 0.79 mg/dL (ref 0.40–1.20)
GFR: 85.23 mL/min (ref >60.00)
Glucose, Bld: 95 mg/dL (ref 70–99)
Potassium: 3.6 mEq/L (ref 3.5–5.1)
Sodium: 141 mEq/L (ref 135–145)
Total Bilirubin: 0.4 mg/dL (ref 0.2–1.2)
Total Protein: 6.4 g/dL (ref 6.0–8.3)

## 2021-12-08 LAB — CK: Total CK: 60 U/L (ref 7–177)

## 2021-12-08 LAB — TSH: TSH: 1.86 u[IU]/mL (ref 0.35–5.50)

## 2021-12-08 NOTE — Progress Notes (Signed)
Tommi Rumps, MD Phone: [phone]  Whitney Hill is a 51 y.o. female who presents today for f/u.  Breast pain: left sided. Notes current breast pain is different than her breast discomfort she was having over the summer.  Previously she was having shooting pains from her left side of her breast into her left nipple.  More recently she has had an area that is very tender in the left breast.  She notes no nipple discharge.  No right breast issues.  Chronic pain: Patient notes discomfort scattered throughout her body.  She notes there seem to be spots that have a burning sensation particularly in her back.  She did have some issues with a burning sensation superficially in her stomach.  Some spots are tender.  She notes at times her joints are tender.  She wonders if she has fibromyalgia.  Obesity: Patient's weight has trended up since coming off of Mounjaro.  She eats mainly protein.  She does eat some fruits.  She does not like vegetables.  Her exercise has been limited by recent knee surgery.  She is starting physical therapy soon.  Headaches: Patient notes that chronic history of headaches though more recently she has had them more frequently and has had what she believes are migraines.  She has photophobia with the recent headaches.  She wakes up with them in the morning.  Tylenol will occasionally help.  She did take an Imitrex that her daughter he takes and notes that helped when she had the most severe headache.  She notes at times they are quite severe.  No numbness or weakness.  She has chronic progressive blurry vision.  Has not seen an eye doctor in some time.  Social History   Tobacco Use  Smoking Status Never  Smokeless Tobacco Never    Current Outpatient Medications on File Prior to Visit  Medication Sig Dispense Refill   diclofenac sodium (VOLTAREN) 1 % GEL Apply 2 g topically 4 (four) times daily.     EPINEPHrine 0.3 mg/0.3 mL IJ SOAJ injection Inject into the  muscle.     fluticasone (FLONASE) 50 MCG/ACT nasal spray SPRAY 2 SPRAYS INTO EACH NOSTRIL EVERY DAY 48 mL 2   ipratropium (ATROVENT) 0.03 % nasal spray Place 2 sprays into both nostrils every 12 (twelve) hours. 30 mL 0   levothyroxine (SYNTHROID) 175 MCG tablet Take 1 tablet (175 mcg total) by mouth daily before breakfast. (Patient taking differently: Take 150 mcg by mouth daily before breakfast.) 90 tablet 1   multivitamin-iron-minerals-folic acid (CENTRUM) chewable tablet Chew 1 tablet by mouth daily.     Syringe/Needle, Disp, (SYRINGE 3CC/25GX1") 25G X 1" 3 ML MISC 3 mLs by Does not apply route every 30 (thirty) days. 12 each 0   thiamine 100 MG tablet Take 1 tablet (100 mg total) by mouth daily.     No current facility-administered medications on file prior to visit.     ROS see history of present illness  Objective  Physical Exam Vitals:   12/08/21 1130  BP: 124/76  Pulse: (!) 59  Temp: 97.7 F (36.5 C)  SpO2: 99%    BP Readings from Last 3 Encounters:  12/08/21 124/76  06/22/21 110/70  12/15/20 90/60   Wt Readings from Last 3 Encounters:  12/08/21 194 lb 6.4 oz (88.2 kg)  06/22/21 183 lb 12.8 oz (83.4 kg)  06/08/21 213 lb (96.6 kg)    Physical Exam Constitutional:      General: She is not in  acute distress.    Appearance: She is not diaphoretic.  Cardiovascular:     Rate and Rhythm: Normal rate and regular rhythm.     Heart sounds: Normal heart sounds.  Pulmonary:     Effort: Pulmonary effort is normal.     Breath sounds: Normal breath sounds.  Chest:       Comments: Latavia CMA served as chaperone, tenderness as outlined, no skin changes in either breast, no palpable masses in either breast, no nipple inversion bilaterally, no axillary masses bilaterally Skin:    General: Skin is warm and dry.  Neurological:     Mental Status: She is alert.      Assessment/Plan: Please see individual problem list.  Problem List Items Addressed This Visit      Arthralgia - Primary    Patient has intermittent issues with arthralgias and myalgias.  Will check lab work to evaluate for an underlying cause.  Discussed the potential that she could have fibromyalgia.      Relevant Orders   CK (Creatine Kinase)   Comp Met (CMET)   TSH   Antinuclear Antib (ANA)   Rheumatoid Factor   Cyclic citrul peptide antibody, IgG (QUEST)   Breast pain, left    Patient's current symptoms are different than she was having over the summer though are in a similar location.  She is quite tender.  She has no overlying skin changes.  We will order mammogram and ultrasound to evaluate this area.  Patient had no rib tenderness and thus no x-rays needed of her ribs.      Relevant Orders   MM DIAG BREAST TOMO BILATERAL   US BREAST LTD UNI LEFT INC AXILLA   US BREAST LTD UNI RIGHT INC AXILLA   New onset of headaches after age 49    Patient does have a chronic history of headaches and more recently seems to be having migraines that are fairly significant and having increased frequency of headaches.  Given her age and her description of symptoms we will proceed with MRI brain to evaluate for an underlying lesion that could be contributing.  Patient reports she had only left in her right leg in 1997 when she had a bone stimulator.  She has had several MRIs since then with no issues.      Relevant Orders   MR Brain Wo Contrast   Obesity (BMI 30.0-34.9)    Patient will continue to work on healthy diet.  She will get back to exercise as she is able to once released by her surgeon after her knee surgery.  Once zepbound is available we will prescribe that for her to start on.  Discussed that this type of medication may be a chronic medication for her as she will likely just regain weight once it stopped.       Return in about 6 weeks (around 01/19/2022) for Recheck breast.  I have spent 45 minutes in the care of this patient regarding history taking, documentation, completion of  exam, discussion of plan, placing orders.   Tommi Rumps, MD [place]

## 2021-12-08 NOTE — Patient Instructions (Signed)
Nice to see you. Somebody will call you to schedule your mammogram. Someone will call you to schedule the MRI.

## 2021-12-08 NOTE — Assessment & Plan Note (Addendum)
Patient will continue to work on healthy diet.  She will get back to exercise as she is able to once released by her surgeon after her knee surgery.  Once zepbound is available we will prescribe that for her to start on.  Discussed that this type of medication may be a chronic medication for her as she will likely just regain weight once it stopped.

## 2021-12-08 NOTE — Assessment & Plan Note (Signed)
Patient has intermittent issues with arthralgias and myalgias.  Will check lab work to evaluate for an underlying cause.  Discussed the potential that she could have fibromyalgia.

## 2021-12-08 NOTE — Assessment & Plan Note (Addendum)
Patient does have a chronic history of headaches and more recently seems to be having migraines that are fairly significant and having increased frequency of headaches.  Given her age and her description of symptoms we will proceed with MRI brain to evaluate for an underlying lesion that could be contributing.  Patient reports she had only left in her right leg in 1997 when she had a bone stimulator.  She has had several MRIs since then with no issues.

## 2021-12-08 NOTE — Assessment & Plan Note (Addendum)
Patient's current symptoms are different than she was having over the summer though are in a similar location.  She is quite tender.  She has no overlying skin changes.  We will order mammogram and ultrasound to evaluate this area.  Patient had no rib tenderness and thus no x-rays needed of her ribs.

## 2021-12-09 DIAGNOSIS — M25561 Pain in right knee: Secondary | ICD-10-CM | POA: Diagnosis not present

## 2021-12-09 DIAGNOSIS — M25661 Stiffness of right knee, not elsewhere classified: Secondary | ICD-10-CM | POA: Diagnosis not present

## 2021-12-12 ENCOUNTER — Telehealth: Payer: Self-pay | Admitting: Family Medicine

## 2021-12-12 LAB — RHEUMATOID FACTOR: Rheumatoid fact SerPl-aCnc: <14 IU/mL (ref <14)

## 2021-12-12 LAB — ANA: Anti Nuclear Antibody (ANA): POSITIVE — AB

## 2021-12-12 LAB — ANTI-NUCLEAR AB-TITER (ANA TITER): ANA Titer 1: 1:40 {titer} — ABNORMAL HIGH

## 2021-12-12 LAB — CYCLIC CITRUL PEPTIDE ANTIBODY, IGG: Cyclic Citrullin Peptide Ab: <16 UNITS

## 2021-12-12 NOTE — Telephone Encounter (Signed)
I spoke with pt she will call to sch. thanks

## 2021-12-14 DIAGNOSIS — M25661 Stiffness of right knee, not elsewhere classified: Secondary | ICD-10-CM | POA: Diagnosis not present

## 2021-12-14 DIAGNOSIS — M25561 Pain in right knee: Secondary | ICD-10-CM | POA: Diagnosis not present

## 2021-12-16 ENCOUNTER — Ambulatory Visit
Admission: RE | Admit: 2021-12-16 | Discharge: 2021-12-16 | Disposition: A | Discharge status: Home / Self Care | Payer: 59 | Source: Ambulatory Visit | Attending: Family Medicine | Admitting: Family Medicine

## 2021-12-16 DIAGNOSIS — G43909 Migraine, unspecified, not intractable, without status migrainosus: Secondary | ICD-10-CM | POA: Diagnosis not present

## 2021-12-16 DIAGNOSIS — R519 Headache, unspecified: Secondary | ICD-10-CM

## 2021-12-20 ENCOUNTER — Other Ambulatory Visit: Payer: Self-pay | Admitting: Family Medicine

## 2021-12-20 MED ORDER — SUMATRIPTAN SUCCINATE 50 MG PO TABS: 50.0000 mg | ORAL_TABLET | ORAL | 0 refills | Status: DC | PRN

## 2021-12-21 ENCOUNTER — Telehealth: Payer: Self-pay

## 2021-12-21 DIAGNOSIS — M25561 Pain in right knee: Secondary | ICD-10-CM | POA: Diagnosis not present

## 2021-12-21 DIAGNOSIS — M25661 Stiffness of right knee, not elsewhere classified: Secondary | ICD-10-CM | POA: Diagnosis not present

## 2021-12-21 NOTE — Telephone Encounter (Signed)
LMOM for pt to CB to discuss what Dr. Caryl Bis stated and to see what she would like to try :     Leone Haven, MD 12/20/2021 10:33 AM EST Back to Top    I do not prescribe the injectable medications for migraines. She would have to go see a neurologist to consider those. I start patients on either a beta blocker or venlafaxine or amitriptyline. Please let me know which of those she would like to proceed with or if she would prefer to see neurology.

## 2021-12-28 ENCOUNTER — Ambulatory Visit
Admission: RE | Admit: 2021-12-28 | Discharge: 2021-12-28 | Disposition: A | Discharge status: Home / Self Care | Payer: 59 | Source: Ambulatory Visit | Attending: Family Medicine | Admitting: Family Medicine

## 2021-12-28 ENCOUNTER — Ambulatory Visit: Payer: 59 | Admitting: Family Medicine

## 2021-12-28 DIAGNOSIS — N644 Mastodynia: Secondary | ICD-10-CM | POA: Diagnosis not present

## 2021-12-28 DIAGNOSIS — M25561 Pain in right knee: Secondary | ICD-10-CM | POA: Diagnosis not present

## 2021-12-28 DIAGNOSIS — M13861 Other specified arthritis, right knee: Secondary | ICD-10-CM | POA: Diagnosis not present

## 2021-12-28 DIAGNOSIS — M25461 Effusion, right knee: Secondary | ICD-10-CM | POA: Diagnosis not present

## 2021-12-30 ENCOUNTER — Telehealth: Payer: Self-pay

## 2021-12-30 NOTE — Telephone Encounter (Signed)
LMOM for pt to CB in regards to Mammogram:     Whitney Haven, MD  [facility] Please let the patient know that her mammogram did not reveal any abnormalities where she is tender.  Please see if she is still having tenderness and discomfort in her left breast.  Thanks.

## 2022-01-03 MED ORDER — VENLAFAXINE HCL ER 37.5 MG PO CP24: 37.5000 mg | ORAL_CAPSULE | Freq: Every day | ORAL | 2 refills | Status: DC

## 2022-01-13 ENCOUNTER — Other Ambulatory Visit: Payer: Self-pay | Admitting: Family Medicine

## 2022-01-13 ENCOUNTER — Encounter: Payer: Self-pay | Admitting: Internal Medicine

## 2022-01-13 DIAGNOSIS — N644 Mastodynia: Secondary | ICD-10-CM

## 2022-01-20 ENCOUNTER — Ambulatory Visit: Payer: 59 | Admitting: Family Medicine

## 2022-01-23 ENCOUNTER — Ambulatory Visit (INDEPENDENT_AMBULATORY_CARE_PROVIDER_SITE_OTHER): Payer: Commercial Managed Care - HMO | Admitting: Family Medicine

## 2022-01-23 ENCOUNTER — Encounter: Payer: Self-pay | Admitting: Family Medicine

## 2022-01-23 ENCOUNTER — Encounter: Payer: Self-pay | Admitting: Internal Medicine

## 2022-01-23 VITALS — BP 118/78 | HR 70 | Temp 97.8°F | Ht 65.0 in | Wt 201.6 lb

## 2022-01-23 DIAGNOSIS — E559 Vitamin D deficiency, unspecified: Secondary | ICD-10-CM | POA: Diagnosis not present

## 2022-01-23 DIAGNOSIS — E669 Obesity, unspecified: Secondary | ICD-10-CM

## 2022-01-23 DIAGNOSIS — E039 Hypothyroidism, unspecified: Secondary | ICD-10-CM

## 2022-01-23 DIAGNOSIS — F32A Depression, unspecified: Secondary | ICD-10-CM

## 2022-01-23 DIAGNOSIS — Z23 Encounter for immunization: Secondary | ICD-10-CM

## 2022-01-23 DIAGNOSIS — E519 Thiamine deficiency, unspecified: Secondary | ICD-10-CM | POA: Diagnosis not present

## 2022-01-23 DIAGNOSIS — N644 Mastodynia: Secondary | ICD-10-CM

## 2022-01-23 DIAGNOSIS — E611 Iron deficiency: Secondary | ICD-10-CM

## 2022-01-23 DIAGNOSIS — Z9884 Bariatric surgery status: Secondary | ICD-10-CM | POA: Diagnosis not present

## 2022-01-23 DIAGNOSIS — F419 Anxiety disorder, unspecified: Secondary | ICD-10-CM

## 2022-01-23 MED ORDER — LEVOTHYROXINE SODIUM 150 MCG PO TABS: 150.0000 ug | ORAL_TABLET | Freq: Every day | ORAL | 0 refills | Status: DC

## 2022-01-23 MED ORDER — EPINEPHRINE 0.3 MG/0.3ML IJ SOAJ: 0.3000 mg | INTRAMUSCULAR | 0 refills | Status: AC | PRN

## 2022-01-23 MED ORDER — ZEPBOUND 2.5 MG/0.5ML ~~LOC~~ SOAJ: 2.5000 mg | SUBCUTANEOUS | 0 refills | Status: AC

## 2022-01-23 NOTE — Assessment & Plan Note (Signed)
Check bariatric lab work.

## 2022-01-23 NOTE — Assessment & Plan Note (Signed)
Ongoing issue.  Patient will see the general surgeon as planned to evaluate for any further treatment options.

## 2022-01-23 NOTE — Assessment & Plan Note (Signed)
Chronic issue.  Check TSH.  Continue Synthroid 150 mcg daily.

## 2022-01-23 NOTE — Assessment & Plan Note (Signed)
New onset issue.  Discussed starting on venlafaxine that has already been prescribed for her 37.5 mg once daily.  Will refer for therapy as well.

## 2022-01-23 NOTE — Patient Instructions (Signed)
Nice to see you. The therapy the office should contact you to set up an appointment. Please let me know if you are able to find the zepbound as I can refill for the next dose if you are able to get it.

## 2022-01-23 NOTE — Assessment & Plan Note (Addendum)
She will try to remain active and work on healthy diet.  Will prescribe stepdown for her to start on.  She will monitor for nausea, vomiting, and abdominal pain with this. She is aware of the risk of pancreatitis and gall bladder issues and that medullary thyroid cancer has been seen in rat studies.
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
## 2022-01-24 LAB — TSH: TSH: 3.38 u[IU]/mL (ref 0.35–5.50)

## 2022-01-24 LAB — VITAMIN B12: Vitamin B-12: 228 pg/mL (ref 211–911)

## 2022-01-24 LAB — IBC + FERRITIN
Ferritin: 47.4 ng/mL (ref 10.0–291.0)
Iron: 92 ug/dL (ref 42–145)
Saturation Ratios: 25.6 % (ref 20.0–50.0)
TIBC: 359.8 ug/dL (ref 250.0–450.0)
Transferrin: 257 mg/dL (ref 212.0–360.0)

## 2022-01-24 LAB — FOLATE: Folate: 2.7 ng/mL — ABNORMAL LOW (ref >5.9)

## 2022-01-24 LAB — VITAMIN D 25 HYDROXY (VIT D DEFICIENCY, FRACTURES): VITD: 8.75 ng/mL — ABNORMAL LOW (ref 30.00–100.00)

## 2022-01-25 ENCOUNTER — Other Ambulatory Visit: Payer: Self-pay | Admitting: Family Medicine

## 2022-01-25 DIAGNOSIS — E538 Deficiency of other specified B group vitamins: Secondary | ICD-10-CM

## 2022-01-25 DIAGNOSIS — E559 Vitamin D deficiency, unspecified: Secondary | ICD-10-CM

## 2022-01-25 MED ORDER — "SYRINGE 25G X 1"" 3 ML MISC": 0 refills | Status: DC

## 2022-01-25 MED ORDER — VITAMIN D (ERGOCALCIFEROL) 1.25 MG (50000 UNIT) PO CAPS: 50000.0000 [IU] | ORAL_CAPSULE | ORAL | 0 refills | Status: DC

## 2022-01-25 MED ORDER — CYANOCOBALAMIN 1000 MCG/ML IJ SOLN: INTRAMUSCULAR | 0 refills | Status: DC

## 2022-01-28 LAB — VITAMIN B1: Vitamin B1 (Thiamine): 14 nmol/L (ref 8–30)

## 2022-01-28 LAB — VITAMIN A: Vitamin A (Retinoic Acid): 33 ug/dL — ABNORMAL LOW (ref 38–98)

## 2022-01-28 LAB — ZINC: Zinc: 57 ug/dL — ABNORMAL LOW (ref 60–130)

## 2022-01-28 LAB — VITAMIN E
Gamma-Tocopherol (Vit E): 1.2 mg/L (ref <=4.3)
Vitamin E (Alpha Tocopherol): 8.4 mg/L (ref 5.7–19.9)

## 2022-01-28 LAB — COPPER, SERUM: Copper: 110 ug/dL (ref 70–175)

## 2022-01-30 ENCOUNTER — Encounter: Payer: Self-pay | Admitting: Internal Medicine

## 2022-01-30 ENCOUNTER — Telehealth (INDEPENDENT_AMBULATORY_CARE_PROVIDER_SITE_OTHER): Payer: Commercial Managed Care - HMO | Admitting: Family Medicine

## 2022-01-30 ENCOUNTER — Encounter: Payer: Self-pay | Admitting: Family Medicine

## 2022-01-30 ENCOUNTER — Other Ambulatory Visit (HOSPITAL_BASED_OUTPATIENT_CLINIC_OR_DEPARTMENT_OTHER): Payer: Self-pay

## 2022-01-30 VITALS — Ht 65.0 in | Wt 202.0 lb

## 2022-01-30 DIAGNOSIS — M2550 Pain in unspecified joint: Secondary | ICD-10-CM

## 2022-01-30 DIAGNOSIS — E538 Deficiency of other specified B group vitamins: Secondary | ICD-10-CM

## 2022-01-30 MED ORDER — PREDNISONE 20 MG PO TABS
40.0000 mg | ORAL_TABLET | Freq: Every day | ORAL | 0 refills | Status: DC
  Filled 2022-01-30: qty 10, 5d supply, fill #0

## 2022-01-30 MED ORDER — CYANOCOBALAMIN 1000 MCG/ML IJ SOLN
INTRAMUSCULAR | 3 refills | Status: DC
  Filled 2022-01-30: qty 3, 21d supply, fill #0

## 2022-01-30 NOTE — Progress Notes (Signed)
Virtual Visit via video Note  This visit type was conducted due to national recommendations for restrictions regarding the COVID-19 pandemic (e.g. social distancing).  This format is felt to be most appropriate for this patient at this time.  All issues noted in this document were discussed and addressed.  No physical exam was performed (except for noted visual exam findings with Video Visits).   I connected with Whitney Hill today at  8:00 AM EST by a video enabled telemedicine application or telephone and verified that I am speaking with the correct person using two identifiers. Location patient: home Location provider: work  Persons participating in the virtual visit: patient, provider  I discussed the limitations, risks, security and privacy concerns of performing an evaluation and management service by telephone and the availability of in person appointments. I also discussed with the patient that there may be a patient responsible charge related to this service. The patient expressed understanding and agreed to proceed.   Reason for visit: same day visit  HPI: Arthralgias: Patient notes onset of joint aches and bone pain all over her body on 01/28/2022.  She notes it was so bad that it was hurting to roll over in bed.  Yesterday was a little bit better.  She notes the joints in her hands feel swollen though they do not look swollen.  She has been taking Tylenol and using Voltaren gel.  She reports a history of this in the past that responded to steroids.  She notes the only other symptom is that she can occasionally hear her heartbeat since this started.  No fever, cough, congestion, headache, vomiting, diarrhea, dysuria, or frequency of urine.  She notes no new medications as she has not started the venlafaxine yet.  In the past she has taken steroids for the symptoms.   ROS: See pertinent positives and negatives per HPI.  Past Medical History:  Diagnosis Date   Arthritis    OA    Complication of anesthesia    "ITCHING TO FACE, BENADRYL HELP BUT DROPS BLOOD PRESSURE"   DVT (deep venous thrombosis) (HCC)    following first knee surgery in 2011   Gastric ulcer    Headache    Heterozygous factor V Leiden mutation ([place])    Hypothyroid    Iron deficiency anemia     Past Surgical History:  Procedure Laterality Date   [place]   COLONOSCOPY WITH PROPOFOL N/A 06/14/2018   Procedure: COLONOSCOPY WITH BIOPSY;  Surgeon: Lucilla Lame, MD;  Location: [place];  Service: Endoscopy;  Laterality: N/A;   DILATION AND CURETTAGE OF UTERUS     1994, 2001   ESOPHAGOGASTRODUODENOSCOPY (EGD) WITH PROPOFOL N/A 06/14/2018   Procedure: ESOPHAGOGASTRODUODENOSCOPY (EGD) WITH PROPOFOL;  Surgeon: Lucilla Lame, MD;  Location: [place];  Service: Endoscopy;  Laterality: N/A;   FOREIGN BODY REMOVAL N/A 06/14/2018   Procedure: FOREIGN BODY REMOVAL;  Surgeon: Lucilla Lame, MD;  Location: [place];  Service: Endoscopy;  Laterality: N/A;  Staple Removal from Stomach   GASTRIC BYPASS     2012, revision 2013   KNEE ARTHROSCOPY     2011, 2013, 2015   POLYPECTOMY N/A 06/14/2018   Procedure: POLYPECTOMY;  Surgeon: Lucilla Lame, MD;  Location: [place];  Service: Endoscopy;  Laterality: N/A;   TOOTH EXTRACTION     2010   TOTAL KNEE ARTHROPLASTY Left 05/25/2015   Procedure:  LEFT TOTAL KNEE ARTHROPLASTY;  Surgeon: Renette Butters, MD;  Location: [place];  Service: Orthopedics;  Laterality: Left;   TUBAL LIGATION     2004    Family History  Problem Relation Age of Onset   Breast cancer Sister 93   Breast cancer Maternal Aunt 51       BRACA + dx twice age 37 and 65   Breast cancer Maternal Grandmother        late 65's   Arthritis Other        parent   Hyperlipidemia Other        parent   Stroke Other        parent   Hypertension Other        parent   Kidney disease Other        parent,  grandparent   Diabetes Other        parent, grandparent   Renal cancer Other        parent    SOCIAL HX: Non-smoker   Current Outpatient Medications:    diclofenac sodium (VOLTAREN) 1 % GEL, Apply 2 g topically 4 (four) times daily., Disp: , Rfl:    EPINEPHrine 0.3 mg/0.3 mL IJ SOAJ injection, Inject 0.3 mg into the muscle as needed for anaphylaxis., Disp: 2 each, Rfl: 0   fluticasone (FLONASE) 50 MCG/ACT nasal spray, SPRAY 2 SPRAYS INTO EACH NOSTRIL EVERY DAY, Disp: 48 mL, Rfl: 2   levothyroxine (SYNTHROID) 150 MCG tablet, Take 1 tablet (150 mcg total) by mouth daily before breakfast., Disp: 90 tablet, Rfl: 0   multivitamin-iron-minerals-folic acid (CENTRUM) chewable tablet, Chew 1 tablet by mouth daily., Disp: , Rfl:    predniSONE (DELTASONE) 20 MG tablet, Take 2 tablets (40 mg total) by mouth daily with breakfast., Disp: 10 tablet, Rfl: 0   SUMAtriptan (IMITREX) 50 MG tablet, Take 1 tablet (50 mg total) by mouth every 2 (two) hours as needed for migraine. May repeat in 2 hours if headache persists or recurs. Do not take more than 2 doses in 24 hours., Disp: 10 tablet, Rfl: 0   Syringe/Needle, Disp, (SYRINGE 3CC/25GX1") 25G X 1" 3 ML MISC, Use with b12 injections, Disp: 50 each, Rfl: 0   thiamine 100 MG tablet, Take 1 tablet (100 mg total) by mouth daily., Disp: , Rfl:    tirzepatide (ZEPBOUND) 2.5 MG/0.5ML Pen, Inject 2.5 mg into the skin once a week for 28 days., Disp: 2 mL, Rfl: 0   venlafaxine XR (EFFEXOR XR) 37.5 MG 24 hr capsule, Take 1 capsule (37.5 mg total) by mouth daily with breakfast., Disp: 30 capsule, Rfl: 2   Vitamin D, Ergocalciferol, (DRISDOL) 1.25 MG (50000 UNIT) CAPS capsule, Take 1 capsule (50,000 Units total) by mouth every 7 (seven) days., Disp: 8 capsule, Rfl: 0   cyanocobalamin (VITAMIN B12) 1000 MCG/ML injection, Inject 1 mL (1000 mcg total) into the muscle once weekly for 4 weeks and then once monthly, Disp: 3 mL, Rfl: 3  EXAM:  VITALS per patient if  applicable:  GENERAL: alert, oriented, appears well and in no acute distress  HEENT: atraumatic, conjunttiva clear, no obvious abnormalities on inspection of external nose and ears  NECK: normal movements of the head and neck  LUNGS: on inspection no signs of respiratory distress, breathing rate appears normal, no obvious gross SOB, gasping or wheezing  CV: no obvious cyanosis  MS: moves all visible extremities without noticeable abnormality  PSYCH/NEURO: pleasant and cooperative, no obvious depression or anxiety, speech and  thought processing grossly intact  ASSESSMENT AND PLAN:  Discussed the following assessment and plan:  Problem List Items Addressed This Visit     Arthralgia - Primary    Recent onset.  Discussed taking a home COVID test just to make sure this is not COVID.  She has had a positive ANA in the past that the titer was low and it was deemed that she did not have any significant rheumatologic issues at that time given the low titer.  Will proceed with treatment with prednisone 40 mg daily for 5 days.  If not improving she will let us know.  She will let me know the results of her home COVID test.  If she develops new symptoms she will let us know right away.      Relevant Medications   predniSONE (DELTASONE) 20 MG tablet   Other Visit Diagnoses     B12 deficiency       Relevant Medications   cyanocobalamin (VITAMIN B12) 1000 MCG/ML injection       Return if symptoms worsen or fail to improve.   I discussed the assessment and treatment plan with the patient. The patient was provided an opportunity to ask questions and all were answered. The patient agreed with the plan and demonstrated an understanding of the instructions.   The patient was advised to call back or seek an in-person evaluation if the symptoms worsen or if the condition fails to improve as anticipated.  Tommi Rumps, MD

## 2022-01-30 NOTE — Assessment & Plan Note (Signed)
Recent onset.  Discussed taking a home COVID test just to make sure this is not COVID.  She has had a positive ANA in the past that the titer was low and it was deemed that she did not have any significant rheumatologic issues at that time given the low titer.  Will proceed with treatment with prednisone 40 mg daily for 5 days.  If not improving she will let us know.  She will let me know the results of her home COVID test.  If she develops new symptoms she will let us know right away.

## 2022-01-31 ENCOUNTER — Other Ambulatory Visit: Payer: Self-pay | Admitting: Family Medicine

## 2022-02-07 ENCOUNTER — Telehealth: Payer: Self-pay | Admitting: Family Medicine

## 2022-02-07 NOTE — Telephone Encounter (Signed)
No referral pt has appt at [facility]

## 2022-02-18 ENCOUNTER — Other Ambulatory Visit: Payer: Self-pay | Admitting: Family Medicine

## 2022-02-18 DIAGNOSIS — E559 Vitamin D deficiency, unspecified: Secondary | ICD-10-CM

## 2022-02-22 ENCOUNTER — Ambulatory Visit: Payer: Commercial Managed Care - HMO | Admitting: Professional

## 2022-02-22 ENCOUNTER — Telehealth (INDEPENDENT_AMBULATORY_CARE_PROVIDER_SITE_OTHER): Payer: Commercial Managed Care - HMO | Admitting: Family Medicine

## 2022-02-22 ENCOUNTER — Other Ambulatory Visit (HOSPITAL_BASED_OUTPATIENT_CLINIC_OR_DEPARTMENT_OTHER): Payer: Self-pay

## 2022-02-22 ENCOUNTER — Encounter: Payer: Self-pay | Admitting: Family Medicine

## 2022-02-22 VITALS — Ht 65.0 in | Wt 202.0 lb

## 2022-02-22 DIAGNOSIS — R0781 Pleurodynia: Secondary | ICD-10-CM

## 2022-02-22 DIAGNOSIS — N644 Mastodynia: Secondary | ICD-10-CM

## 2022-02-22 DIAGNOSIS — M2550 Pain in unspecified joint: Secondary | ICD-10-CM

## 2022-02-22 MED ORDER — CYCLOBENZAPRINE HCL 5 MG PO TABS
5.0000 mg | ORAL_TABLET | Freq: Three times a day (TID) | ORAL | 1 refills | Status: AC | PRN
  Filled 2022-02-22: qty 30, 10d supply, fill #0
  Filled 2022-04-18: qty 30, 10d supply, fill #1

## 2022-02-22 NOTE — Assessment & Plan Note (Signed)
This continues to be an issue.  Discussed at this point it may be beneficial to have her see rheumatology to see if they think this could be a seronegative rheumatologic issue.  Discussed her symptoms could also represent fibromyalgia.  We will give her a trial of cyclobenzaprine to see if this is beneficial for her symptoms when they are excessive.  Counseled on risk of this making her drowsy.  Advised not to drive if she is drowsy while taking this.

## 2022-02-22 NOTE — Progress Notes (Signed)
Virtual Visit via Telephone Note  This visit type was conducted due to national recommendations for restrictions regarding the COVID-19 pandemic (e.g. social distancing).  This format is felt to be most appropriate for this patient at this time.  All issues noted in this document were discussed and addressed.  No physical exam was performed (except for noted visual exam findings with Video Visits).   I connected with Whitney Hill today at  2:45 PM EST by telephone and verified that I am speaking with the correct person using two identifiers. Location patient: home Location provider: work Persons participating in the virtual visit: patient, provider  I discussed the limitations, risks, security and privacy concerns of performing an evaluation and management service by telephone and the availability of in person appointments. I also discussed with the patient that there may be a patient responsible charge related to this service. The patient expressed understanding and agreed to proceed.  Interactive audio and video telecommunications were attempted between this provider and patient, however failed, due to patient having technical difficulties.  We continued and completed visit with audio only.   Reason for visit: f/u  HPI: Myalgias/arthralgias/breast pain: Patient continues to have pain in various spots all over her body.  She notes at times her joints hurt so much it is hard for her to get moving and get out of bed.  Notes her joints are tender when this occurs.  She has random patches that burn across her skin.  She notes the area in her left breast continues to bother her.  She did see the surgeon recently who felt as though this was not breast related.  She has been using diclofenac to help with her symptoms.  She also recently started on venlafaxine.  Patient has noted some hot flashes that were going on prior to starting on the venlafaxine.  She reports a strong family history of  autoimmune issues.  Prior ANA was elevated though only a 1:40.  Rheumatoid factor and CCP were both negative.  She did see rheumatology several years ago and they noted that this was not likely to be rheumatologic.   ROS: See pertinent positives and negatives per HPI.  Past Medical History:  Diagnosis Date   Arthritis    OA   Complication of anesthesia    "ITCHING TO FACE, BENADRYL HELP BUT DROPS BLOOD PRESSURE"   DVT (deep venous thrombosis) (HCC)    following first knee surgery in 2011   Gastric ulcer    Headache    Heterozygous factor V Leiden mutation ([place])    Hypothyroid    Iron deficiency anemia     Past Surgical History:  Procedure Laterality Date   [place]   COLONOSCOPY WITH PROPOFOL N/A 06/14/2018   Procedure: COLONOSCOPY WITH BIOPSY;  Surgeon: Lucilla Lame, MD;  Location: [facility];  Service: Endoscopy;  Laterality: N/A;   DILATION AND CURETTAGE OF UTERUS     1994, 2001   ESOPHAGOGASTRODUODENOSCOPY (EGD) WITH PROPOFOL N/A 06/14/2018   Procedure: ESOPHAGOGASTRODUODENOSCOPY (EGD) WITH PROPOFOL;  Surgeon: Lucilla Lame, MD;  Location: [facility];  Service: Endoscopy;  Laterality: N/A;   FOREIGN BODY REMOVAL N/A 06/14/2018   Procedure: FOREIGN BODY REMOVAL;  Surgeon: Lucilla Lame, MD;  Location: [facility];  Service: Endoscopy;  Laterality: N/A;  Staple Removal from Stomach   GASTRIC BYPASS     2012, revision 2013   KNEE  ARTHROSCOPY     2011, 2013, 2015   POLYPECTOMY N/A 06/14/2018   Procedure: POLYPECTOMY;  Surgeon: Lucilla Lame, MD;  Location: [facility];  Service: Endoscopy;  Laterality: N/A;   TOOTH EXTRACTION     2010   TOTAL KNEE ARTHROPLASTY Left 05/25/2015   Procedure: LEFT TOTAL KNEE ARTHROPLASTY;  Surgeon: Renette Butters, MD;  Location: [place] [place];  Service: Orthopedics;  Laterality: Left;   TUBAL LIGATION     2004    Family History  Problem Relation Age of Onset   Breast  cancer Sister 52   Breast cancer Maternal Aunt 8       BRACA + dx twice age 56 and 42   Breast cancer Maternal Grandmother        late 65's   Arthritis Other        parent   Hyperlipidemia Other        parent   Stroke Other        parent   Hypertension Other        parent   Kidney disease Other        parent, grandparent   Diabetes Other        parent, grandparent   Renal cancer Other        parent    SOCIAL HX: Non-smoker   Current Outpatient Medications:    cyanocobalamin (VITAMIN B12) 1000 MCG/ML injection, Inject 1 mL (1000 mcg total) into the muscle once weekly for 4 weeks and then once monthly, Disp: 3 mL, Rfl: 3   cyclobenzaprine (FLEXERIL) 5 MG tablet, Take 1 tablet (5 mg total) by mouth 3 (three) times daily as needed (muscle pain)., Disp: 30 tablet, Rfl: 1   diclofenac sodium (VOLTAREN) 1 % GEL, Apply 2 g topically 4 (four) times daily., Disp: , Rfl:    EPINEPHrine 0.3 mg/0.3 mL IJ SOAJ injection, Inject 0.3 mg into the muscle as needed for anaphylaxis., Disp: 2 each, Rfl: 0   fluticasone (FLONASE) 50 MCG/ACT nasal spray, SPRAY 2 SPRAYS INTO EACH NOSTRIL EVERY DAY, Disp: 48 mL, Rfl: 2   folic acid (FOLVITE) 1 MG tablet, Take 1 mg by mouth daily., Disp: , Rfl:    levothyroxine (SYNTHROID) 150 MCG tablet, Take 1 tablet (150 mcg total) by mouth daily before breakfast., Disp: 90 tablet, Rfl: 0   multivitamin-iron-minerals-folic acid (CENTRUM) chewable tablet, Chew 1 tablet by mouth daily., Disp: , Rfl:    SUMAtriptan (IMITREX) 50 MG tablet, TAKE 1 TABLET (50 MG TOTAL) BY MOUTH EVERY 2 (TWO) HOURS AS NEEDED FOR MIGRAINE. MAY REPEAT IN 2 HOURS IF HEADACHE PERSISTS OR RECURS. DO NOT TAKE MORE THAN 2 DOSES IN 24 HOURS., Disp: 10 tablet, Rfl: 0   Syringe/Needle, Disp, (SYRINGE 3CC/25GX1") 25G X 1" 3 ML MISC, Use with b12 injections, Disp: 50 each, Rfl: 0   thiamine 100 MG tablet, Take 1 tablet (100 mg total) by mouth daily., Disp: , Rfl:    venlafaxine XR (EFFEXOR-XR) 37.5 MG 24  hr capsule, TAKE 1 CAPSULE BY MOUTH DAILY WITH BREAKFAST., Disp: 90 capsule, Rfl: 1   Vitamin D, Ergocalciferol, (DRISDOL) 1.25 MG (50000 UNIT) CAPS capsule, Take 1 capsule (50,000 Units total) by mouth every 7 (seven) days., Disp: 8 capsule, Rfl: 0  EXAM: This was a telephone visit and thus no exam was completed.  ASSESSMENT AND PLAN:  Discussed the following assessment and plan:  Problem List Items Addressed This Visit     Arthralgia    This continues to  be an issue.  Discussed at this point it may be beneficial to have her see rheumatology to see if they think this could be a seronegative rheumatologic issue.  Discussed her symptoms could also represent fibromyalgia.  We will give her a trial of cyclobenzaprine to see if this is beneficial for her symptoms when they are excessive.  Counseled on risk of this making her drowsy.  Advised not to drive if she is drowsy while taking this.      Relevant Medications   cyclobenzaprine (FLEXERIL) 5 MG tablet   Other Relevant Orders   Ambulatory referral to Rheumatology   Breast pain, left    Unlikely to be related to a breast source given recent evaluation with general surgery and negative mammogram.  We will look at musculoskeletal causes for this.      Rib pain on left side - Primary    She will have an x-ray of this area to evaluate for underlying bony processes.      Relevant Orders   DG Ribs Unilateral Left    Return in about 6 weeks (around 04/05/2022).   I discussed the assessment and treatment plan with the patient. The patient was provided an opportunity to ask questions and all were answered. The patient agreed with the plan and demonstrated an understanding of the instructions.   The patient was advised to call back or seek an in-person evaluation if the symptoms worsen or if the condition fails to improve as anticipated.  I provided 17 minutes of non-face-to-face time during this encounter.   Tommi Rumps, MD

## 2022-02-22 NOTE — Assessment & Plan Note (Signed)
Unlikely to be related to a breast source given recent evaluation with general surgery and negative mammogram.  We will look at musculoskeletal causes for this.

## 2022-02-22 NOTE — Assessment & Plan Note (Signed)
She will have an x-ray of this area to evaluate for underlying bony processes.

## 2022-02-23 ENCOUNTER — Encounter: Payer: Self-pay | Admitting: Internal Medicine

## 2022-02-23 ENCOUNTER — Other Ambulatory Visit: Payer: Self-pay | Admitting: Family Medicine

## 2022-02-23 ENCOUNTER — Other Ambulatory Visit (HOSPITAL_BASED_OUTPATIENT_CLINIC_OR_DEPARTMENT_OTHER): Payer: Self-pay

## 2022-02-23 MED ORDER — SUMATRIPTAN SUCCINATE 50 MG PO TABS
50.0000 mg | ORAL_TABLET | ORAL | 0 refills | Status: DC | PRN
  Filled 2022-02-23: qty 9, 30d supply, fill #0
  Filled 2022-04-18: qty 9, 30d supply, fill #1

## 2022-02-23 MED FILL — Sumatriptan Succinate Tab 50 MG: ORAL | 1 days supply | Qty: 1 | Fill #0 | Status: CN

## 2022-03-20 ENCOUNTER — Telehealth: Payer: Commercial Managed Care - HMO | Admitting: Family Medicine

## 2022-03-28 ENCOUNTER — Other Ambulatory Visit: Payer: Commercial Managed Care - HMO

## 2022-04-04 ENCOUNTER — Ambulatory Visit (INDEPENDENT_AMBULATORY_CARE_PROVIDER_SITE_OTHER): Payer: Commercial Managed Care - HMO | Admitting: Behavioral Health

## 2022-04-04 DIAGNOSIS — F419 Anxiety disorder, unspecified: Secondary | ICD-10-CM | POA: Diagnosis not present

## 2022-04-04 DIAGNOSIS — F32A Depression, unspecified: Secondary | ICD-10-CM

## 2022-04-04 NOTE — Progress Notes (Signed)
                 L , LMFT 
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
## 2022-04-10 ENCOUNTER — Ambulatory Visit: Payer: Commercial Managed Care - HMO | Admitting: Family Medicine

## 2022-04-18 ENCOUNTER — Other Ambulatory Visit: Payer: Self-pay | Admitting: Family Medicine

## 2022-04-18 DIAGNOSIS — E559 Vitamin D deficiency, unspecified: Secondary | ICD-10-CM

## 2022-04-19 ENCOUNTER — Other Ambulatory Visit (HOSPITAL_BASED_OUTPATIENT_CLINIC_OR_DEPARTMENT_OTHER): Payer: Self-pay

## 2022-04-19 ENCOUNTER — Other Ambulatory Visit: Payer: Self-pay | Admitting: Family Medicine

## 2022-04-19 ENCOUNTER — Ambulatory Visit (INDEPENDENT_AMBULATORY_CARE_PROVIDER_SITE_OTHER): Payer: Commercial Managed Care - HMO | Admitting: Family Medicine

## 2022-04-19 VITALS — BP 116/72 | HR 80 | Temp 98.0°F | Ht 65.0 in | Wt 206.2 lb

## 2022-04-19 DIAGNOSIS — M25571 Pain in right ankle and joints of right foot: Secondary | ICD-10-CM

## 2022-04-19 DIAGNOSIS — E538 Deficiency of other specified B group vitamins: Secondary | ICD-10-CM | POA: Diagnosis not present

## 2022-04-19 DIAGNOSIS — F419 Anxiety disorder, unspecified: Secondary | ICD-10-CM | POA: Diagnosis not present

## 2022-04-19 DIAGNOSIS — G8929 Other chronic pain: Secondary | ICD-10-CM

## 2022-04-19 DIAGNOSIS — E559 Vitamin D deficiency, unspecified: Secondary | ICD-10-CM | POA: Diagnosis not present

## 2022-04-19 DIAGNOSIS — M2550 Pain in unspecified joint: Secondary | ICD-10-CM | POA: Diagnosis not present

## 2022-04-19 DIAGNOSIS — E669 Obesity, unspecified: Secondary | ICD-10-CM

## 2022-04-19 DIAGNOSIS — F32A Depression, unspecified: Secondary | ICD-10-CM

## 2022-04-19 MED ORDER — SUMATRIPTAN SUCCINATE 50 MG PO TABS
50.0000 mg | ORAL_TABLET | ORAL | 0 refills | Status: DC | PRN
  Filled 2022-04-19: qty 9, 15d supply, fill #0

## 2022-04-19 MED ORDER — ZEPBOUND 5 MG/0.5ML ~~LOC~~ SOAJ
5.0000 mg | SUBCUTANEOUS | 2 refills | Status: DC
Start: 2022-04-19 — End: 2022-07-17

## 2022-04-19 MED ORDER — ZEPBOUND 5 MG/0.5ML ~~LOC~~ SOAJ
5.0000 mg | SUBCUTANEOUS | 2 refills | Status: DC
Start: 2022-04-19 — End: 2022-04-19
  Filled 2022-04-19: qty 6, 84d supply, fill #0

## 2022-04-19 NOTE — Assessment & Plan Note (Signed)
Chronic issue.  Tolerating Zepbound.  We will increase her zepbound to 5 mg weekly and she will send me a message in 1 to 2 months to let me know how her weight loss is going.

## 2022-04-19 NOTE — Assessment & Plan Note (Signed)
Improved.  Patient will continue to see her therapist.

## 2022-04-19 NOTE — Patient Instructions (Signed)
Please let us know if you do not hear anything from the orthopedist within 2 weeks.

## 2022-04-19 NOTE — Assessment & Plan Note (Signed)
Chronic issue.  We will refer to Dr. Aldean Baker in [place] for evaluation of her pain to see if there is anything else that can be done.
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
## 2022-04-19 NOTE — Assessment & Plan Note (Signed)
Chronic issue.  She will see rheumatology as planned.  She can use Flexeril as prescribed.

## 2022-04-20 ENCOUNTER — Telehealth: Payer: Self-pay

## 2022-04-20 ENCOUNTER — Other Ambulatory Visit (HOSPITAL_BASED_OUTPATIENT_CLINIC_OR_DEPARTMENT_OTHER): Payer: Self-pay

## 2022-04-20 LAB — VITAMIN D 25 HYDROXY (VIT D DEFICIENCY, FRACTURES): VITD: 16.76 ng/mL — ABNORMAL LOW (ref 30.00–100.00)

## 2022-04-20 LAB — VITAMIN B12: Vitamin B-12: 398 pg/mL (ref 211–911)

## 2022-04-20 LAB — FOLATE: Folate: 2.5 ng/mL — ABNORMAL LOW (ref >5.9)

## 2022-04-20 MED ORDER — VITAMIN D (ERGOCALCIFEROL) 1.25 MG (50000 UNIT) PO CAPS
50000.0000 [IU] | ORAL_CAPSULE | ORAL | 1 refills | Status: DC
  Filled 2022-04-20: qty 12, 84d supply, fill #0
  Filled 2022-06-02: qty 12, 84d supply, fill #1

## 2022-04-20 NOTE — Telephone Encounter (Signed)
Left message to call the office back regarding her lab results 

## 2022-04-20 NOTE — Telephone Encounter (Signed)
-----   Message from Glori Luis, MD sent at 04/20/2022 11:48 AM EDT ----- Please let the patient know her folate remains low.  Can you find out how much of a folate supplement she is taking?  Her vitamin D remains low.  I sent in a supplement for her to continue on.  We can recheck that at her next visit.

## 2022-04-27 ENCOUNTER — Ambulatory Visit (INDEPENDENT_AMBULATORY_CARE_PROVIDER_SITE_OTHER): Payer: Commercial Managed Care - HMO | Admitting: Behavioral Health

## 2022-04-27 DIAGNOSIS — F419 Anxiety disorder, unspecified: Secondary | ICD-10-CM

## 2022-04-27 DIAGNOSIS — F32A Depression, unspecified: Secondary | ICD-10-CM

## 2022-04-27 NOTE — Progress Notes (Signed)
                 L , LMFT 
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
## 2022-05-02 ENCOUNTER — Encounter: Payer: Self-pay | Admitting: Orthopedic Surgery

## 2022-05-02 ENCOUNTER — Other Ambulatory Visit (INDEPENDENT_AMBULATORY_CARE_PROVIDER_SITE_OTHER): Payer: Commercial Managed Care - HMO

## 2022-05-02 ENCOUNTER — Ambulatory Visit: Payer: Commercial Managed Care - HMO | Admitting: Family Medicine

## 2022-05-02 ENCOUNTER — Ambulatory Visit (INDEPENDENT_AMBULATORY_CARE_PROVIDER_SITE_OTHER): Payer: Commercial Managed Care - HMO | Admitting: Orthopedic Surgery

## 2022-05-02 DIAGNOSIS — G8929 Other chronic pain: Secondary | ICD-10-CM

## 2022-05-02 DIAGNOSIS — M25571 Pain in right ankle and joints of right foot: Secondary | ICD-10-CM

## 2022-05-02 DIAGNOSIS — M12571 Traumatic arthropathy, right ankle and foot: Secondary | ICD-10-CM

## 2022-05-02 DIAGNOSIS — M1711 Unilateral primary osteoarthritis, right knee: Secondary | ICD-10-CM | POA: Diagnosis not present

## 2022-05-02 NOTE — Progress Notes (Signed)
Office Visit Note   Patient: Whitney Hill           Date of Birth: 1970/06/17           MRN: [id number] Visit Date: 05/02/2022              Requested by: Glori Luis, MD [address] [place],  [place] [address] PCP: Glori Luis, MD  Chief Complaint  Patient presents with   Right Ankle - Pain   Right Knee - Pain      HPI: Patient is a 52 year old woman who presents with chronic right knee pain and chronic right ankle pain.  Patient feels like the knee pain has developed from limping from her ankle traumatic injury.  She has had several arthroscopies in the knee without relief.  She has had several steroid injections in the right knee and many steroid injections in the right ankle.  She is status post multiple surgical interventions for the right ankle including internal and external fixation with placement of a bone stimulator as well as removal of retained hardware.  She still has a retained wire from the bone stimulator that appears within the fibula.  Most recent injection in her right ankle was in February.  She is previously getting injections at [facility].  She states that she was essentially nonweightbearing for 3 months after her initial ankle injury.  Patient's last knee arthroscopy was in September 2023.  Patient states she has pain in her ankle and knee with all activities of daily living.  Assessment & Plan: Visit Diagnoses:  1. Arthritis of right knee   2. Chronic pain of right ankle   3. Traumatic arthritis of right ankle     Plan: Will order an MRI scan of the right knee.  Patient most likely has meniscal changes secondary to previous surgery and her antalgic gait.  The retained wire inside the fibula should not be a problem with the MRI scan.  Will plan for right ankle arthroscopy.  Patient states in the past she has been told she may need a fusion.  Would be helpful to try to debride the ankle and provide more function prior to proceeding with  a ankle fusion or total ankle arthroplasty.  Risks and benefits of surgery were discussed including the potential for improvement of her symptoms of about 50 to 75%.  Follow-Up Instructions: Return if symptoms worsen or fail to improve.   Ortho Exam  Patient is alert, oriented, no adenopathy, well-dressed, normal affect, normal respiratory effort. Examination patient has an antalgic gait on the right.  She has a palpable dorsalis pedis pulse.  She has dorsiflexion to neutral and pain anteriorly to palpation over the ankle.  She has active plantarflexion and dorsiflexion and has decreased range of motion of her toes.  Attempted dorsiflexion is painful most likely secondary to the calcification of the syndesmosis.  Imaging: XR Knee 1-2 Views Right  Result Date: 05/02/2022 2 view radiographs of the right knee shows mild medial joint line narrowing with no subcondylar cyst or sclerosis.  There is a unicompartmental arthroplasty in the left knee.  No images are attached to the encounter.  Labs: Lab Results  Component Value Date   HGBA1C 5.9 12/15/2020   HGBA1C 5.7 08/05/2019   HGBA1C 5.4 01/14/2018   ESRSEDRATE 16 09/18/2017   CRP 0.2 (L) 09/18/2017   REPTSTATUS 08/15/2019 FINAL 08/13/2019   CULT >=100,000 COLONIES/mL ESCHERICHIA COLI (A) 08/13/2019   LABORGA ESCHERICHIA COLI (  A) 08/13/2019     Lab Results  Component Value Date   ALBUMIN 4.2 12/08/2021   ALBUMIN 4.3 12/15/2020   ALBUMIN 4.3 12/08/2020    Lab Results  Component Value Date   MG 2.0 01/18/2018   Lab Results  Component Value Date   VD25OH 16.76 (L) 04/19/2022   VD25OH 8.75 (L) 01/23/2022   VD25OH 21.57 (L) 01/14/2018    No results found for: "PREALBUMIN"    Latest Ref Rng & Units 12/08/2020   12:10 AM 10/22/2020    3:27 PM 04/08/2020   10:56 AM  CBC EXTENDED  WBC 4.0 - 10.5 K/uL 8.7  7.9  7.2   RBC 3.87 - 5.11 MIL/uL 4.15  4.02  4.23   Hemoglobin 12.0 - 15.0 g/dL 98.1  19.1  47.8   HCT 36.0 - 46.0 %  40.0  39.4  41.3   Platelets 150 - 400 K/uL 320  321  308   NEUT# 1.7 - 7.7 K/uL  4.9  4.3   Lymph# 0.7 - 4.0 K/uL  2.3  2.3      There is no height or weight on file to calculate BMI.  Orders:  Orders Placed This Encounter  Procedures   XR Ankle Complete Right   XR Knee 1-2 Views Right   No orders of the defined types were placed in this encounter.    Procedures: No procedures performed  Clinical Data: No additional findings.  ROS:  All other systems negative, except as noted in the HPI. Review of Systems  Objective: Vital Signs: LMP 08/24/2018 (Exact Date)   Specialty Comments:  No specialty comments available.  PMFS History: Patient Active Problem List   Diagnosis Date Noted   Anxiety and depression 01/23/2022   Breast pain, left 12/08/2021   New onset of headaches after age 31 12/08/2021   Itchy skin 06/22/2021   Endometrial polyp 06/08/2021   Rib pain on left side 06/08/2021   History of gastric ulcer 06/08/2021   Urine frequency 06/08/2021   Post-traumatic osteoarthritis of right ankle 04/08/2021   Encounter for general adult medical examination with abnormal findings 12/15/2020   History of COVID-19 12/15/2020   Trochanteric bursitis, left hip 10/08/2020   Patellofemoral syndrome of right knee 02/23/2020   Proteinuria 01/19/2020   Arthritis of right knee 08/21/2019   Osteoarthritis of ankle and foot 08/21/2019   Nocturia 08/05/2019   Hamstring strain 08/05/2019   History of abnormal cervical Pap smear 07/09/2018   Polyp of sigmoid colon    Menorrhagia with irregular cycle 06/04/2018   Left arm weakness 02/20/2018   Left elbow pain 02/05/2018   Hypokalemia 02/05/2018   Thiamine deficiency 01/17/2018   Arthralgia 09/18/2017   Tick bite 09/18/2017   Vitamin D deficiency 09/18/2017   Hashimoto's disease 09/18/2017   Vision loss 09/18/2017   Hives 03/13/2017   Constipation 04/28/2016   Right ankle pain 04/28/2016   Abnormal uterine bleeding  02/07/2016   Iron deficiency 02/04/2016   Late period 11/24/2015   Special screening for malignant neoplasms, colon 10/14/2015   Skin nodule 09/20/2015   Status post left partial knee replacement, medial aspect 07/09/2015   Obesity (BMI 30.0-34.9) 07/09/2015   Knee osteochondritis dessicans 05/25/2015   Plantar fasciitis 05/13/2015   Iron deficiency anemia 01/14/2015   Hypothyroidism 10/01/2014   Left knee pain 10/01/2014   Cramps, extremity 10/01/2014   Allergic rhinitis 10/01/2014   S/P gastric bypass 10/01/2014   Past Medical History:  Diagnosis Date   Arthritis  OA   Complication of anesthesia    "ITCHING TO FACE, BENADRYL HELP BUT DROPS BLOOD PRESSURE"   DVT (deep venous thrombosis)    following first knee surgery in 2011   Gastric ulcer    Headache    Heterozygous factor V Leiden mutation    Hypothyroid    Iron deficiency anemia     Family History  Problem Relation Age of Onset   Breast cancer Sister 7   Breast cancer Maternal Aunt 74       BRACA + dx twice age 87 and 97   Breast cancer Maternal Grandmother        late 26's   Arthritis Other        parent   Hyperlipidemia Other        parent   Stroke Other        parent   Hypertension Other        parent   Kidney disease Other        parent, grandparent   Diabetes Other        parent, grandparent   Renal cancer Other        parent    Past Surgical History:  Procedure Laterality Date   ANKLE FRACTURE SURGERY     1994, 1996   CHOLECYSTECTOMY     1998   COLONOSCOPY WITH PROPOFOL N/A 06/14/2018   Procedure: COLONOSCOPY WITH BIOPSY;  Surgeon: Midge Minium, MD;  Location: [facility];  Service: Endoscopy;  Laterality: N/A;   DILATION AND CURETTAGE OF UTERUS     1994, 2001   ESOPHAGOGASTRODUODENOSCOPY (EGD) WITH PROPOFOL N/A 06/14/2018   Procedure: ESOPHAGOGASTRODUODENOSCOPY (EGD) WITH PROPOFOL;  Surgeon: Midge Minium, MD;  Location: [facility];  Service: Endoscopy;  Laterality: N/A;    FOREIGN BODY REMOVAL N/A 06/14/2018   Procedure: FOREIGN BODY REMOVAL;  Surgeon: Midge Minium, MD;  Location: [facility];  Service: Endoscopy;  Laterality: N/A;  Staple Removal from Stomach   GASTRIC BYPASS     2012, revision 2013   KNEE ARTHROSCOPY     2011, 2013, 2015   POLYPECTOMY N/A 06/14/2018   Procedure: POLYPECTOMY;  Surgeon: Midge Minium, MD;  Location: [facility];  Service: Endoscopy;  Laterality: N/A;   TOOTH EXTRACTION     2010   TOTAL KNEE ARTHROPLASTY Left 05/25/2015   Procedure: LEFT TOTAL KNEE ARTHROPLASTY;  Surgeon: Sheral Apley, MD;  Location: [facility] OR;  Service: Orthopedics;  Laterality: Left;   TUBAL LIGATION     2004   Social History   Occupational History   Not on file  Tobacco Use   Smoking status: Never   Smokeless tobacco: Never  Vaping Use   Vaping Use: Never used  Substance and Sexual Activity   Alcohol use: No    Alcohol/week: 0.0 standard drinks of alcohol   Drug use: No   Sexual activity: Not on file

## 2022-05-12 ENCOUNTER — Other Ambulatory Visit: Payer: Self-pay | Admitting: Family Medicine

## 2022-05-16 ENCOUNTER — Other Ambulatory Visit (HOSPITAL_BASED_OUTPATIENT_CLINIC_OR_DEPARTMENT_OTHER): Payer: Self-pay

## 2022-05-16 MED ORDER — SUMATRIPTAN SUCCINATE 50 MG PO TABS
50.0000 mg | ORAL_TABLET | ORAL | 0 refills | Status: DC | PRN
  Filled 2022-05-16: qty 9, 15d supply, fill #0

## 2022-05-18 ENCOUNTER — Ambulatory Visit (INDEPENDENT_AMBULATORY_CARE_PROVIDER_SITE_OTHER): Payer: Commercial Managed Care - HMO | Admitting: Behavioral Health

## 2022-05-18 DIAGNOSIS — F32A Depression, unspecified: Secondary | ICD-10-CM

## 2022-05-18 DIAGNOSIS — F419 Anxiety disorder, unspecified: Secondary | ICD-10-CM | POA: Diagnosis not present

## 2022-05-18 NOTE — Progress Notes (Signed)
[facility] Counselor/Therapist Progress Note  Patient ID: Whitney Hill, MRN: [id number],    Date: 05/18/2022  Time Spent: 55 min Caregility video; Pt is in her bedrm in private & Provider is working remotely from [employer]   Treatment Type: Individual Therapy  Reported Symptoms: Elevated anx/dep & stress due to health status changes  Mental Status Exam: Appearance:  Casual     Behavior: Appropriate and Sharing  Motor: Normal  Speech/Language:  Clear and Coherent  Affect: Appropriate  Mood: normal  Thought process: normal  Thought content:   WNL  Sensory/Perceptual disturbances:   WNL  Orientation: oriented to person, place, and time/date  Attention: Good  Concentration: Good  Memory: WNL  Fund of knowledge:  Good  Insight:   Good  Judgment:  Good  Impulse Control: Good   Risk Assessment: Danger to Self:  No Self-injurious Behavior: No Danger to Others: No Duty to Warn:no Physical Aggression / Violence:No  Access to Firearms a concern: No  Gang Involvement:No   Subjective: Pt is worried for her knee & ankle issues. Her Husb has gone out of town now & his work is unpredictable. He has travelled down to [place].   Interventions: Family Systems  Diagnosis:Anxiety and depression  Plan: Whitney Hill will cont to keep her Notebook to record her thoughts & feelings btwn sessions. She is concerned for her Dtr's migraine medication being denied. She is frustrated due to Ins changes & her health status changes.  Target Date: 07/09/2022  Progress: 5  Frequency: Once every 3-4 wks  Modality: Claretta Fraise, LMFT

## 2022-05-18 NOTE — Progress Notes (Signed)
                 L , LMFT 

## 2022-05-18 NOTE — Progress Notes (Signed)
Office Visit Note  Patient: Whitney Hill             Date of Birth: 1970/01/22           MRN: [id number]             PCP: Glori Luis, MD Referring: Glori Luis, MD Visit Date: 05/19/2022   Subjective:  New Patient (Initial Visit) (Patient states she has an upcoming right ankle surgery not scheduled yet. Patient states she has an upcoming MRI on her right knee not scheduled yet. Patient states her fifth digit on her right hand hurts. Patient states her hands and wrists hurt. Patient states she has muscle pain throughout her left arm. Patient states she gets intense burning sensations especially on her back, shoulders, and stomach. )   History of Present Illness: Whitney Hill is a 52 y.o. female here for evaluation of chronic joint and muscle pains with positive ANA. She has extensive history of injury and surgical revision in right ankle and knee pain seeing orthopedic surgery.  She experiences pain sometimes localized to specific joints but often more generalized.  Symptoms vary in severity with good days and bad days.  Most persistent affected area most days is hand pain.  Worst area is on the right fifth finger.  Does not see any visible swelling or discoloration.  Often gets an intense burning type sensation that can last for a day or longer.  Does not have numbness and weakness more painful sensation.  She uses topical Voltaren sometimes but often does not get a very good response and symptoms. Skin rashes have also been a longstanding problem she frequently notices these get worse with sun exposure or just getting overly hot in general.  Tends to see redness sometimes slightly raised lesions they do not leave any residual scarring or discoloration.  Antihistamines did not improve symptoms.  She was started on Xolair with allergy clinic for the chronic hives got 1 dose that was helpful for 9 months before symptoms recurred.  Subsequently started on maintenance  treatment but is off this since the COVID pandemic disrupted regular clinic follow-up although her symptoms remain better. She has a history of Hashimoto's thyroiditis on Synthroid replacement the most recent labs have been normal.  Also had previous gastric bypass surgery in gallbladder removal no has chronic diarrhea and has had problems with several vitamin deficiencies. Also experiences frequent migraine headaches.  Not usually associated with aura.  Has Imitrex just as needed.   04/2022 Vit D 16.76 Folate 2.5  11/2021 ANA 1:40 homogenous RF neg CCP neg Vit D 8.75  Activities of Daily Living:  Patient reports morning stiffness for 0 minute.   Patient Reports nocturnal pain.  Difficulty dressing/grooming: Denies Difficulty climbing stairs: Reports Difficulty getting out of chair: Reports Difficulty using hands for taps, buttons, cutlery, and/or writing: Reports  Review of Systems  Constitutional:  Positive for fatigue.  HENT:  Positive for mouth sores. Negative for mouth dryness.   Eyes:  Negative for dryness.  Respiratory:  Negative for shortness of breath.   Cardiovascular:  Negative for chest pain and palpitations.  Gastrointestinal:  Negative for blood in stool, constipation and diarrhea.  Endocrine: Positive for increased urination.  Genitourinary:  Positive for involuntary urination.  Musculoskeletal:  Positive for joint pain, joint pain, joint swelling, myalgias, muscle weakness, muscle tenderness and myalgias. Negative for gait problem and morning stiffness.  Skin:  Positive for rash and sensitivity to sunlight. Negative for  color change and hair loss.  Allergic/Immunologic: Negative for susceptible to infections.  Neurological:  Positive for headaches. Negative for dizziness.  Hematological:  Negative for swollen glands.  Psychiatric/Behavioral:  Negative for depressed mood and sleep disturbance. The patient is not nervous/anxious.     PMFS History:  Patient Active  Problem List   Diagnosis Date Noted   Positive ANA (antinuclear antibody) 05/19/2022   Anxiety and depression 01/23/2022   Breast pain, left 12/08/2021   New onset of headaches after age 24 12/08/2021   Itchy skin 06/22/2021   Endometrial polyp 06/08/2021   Rib pain on left side 06/08/2021   History of gastric ulcer 06/08/2021   Urine frequency 06/08/2021   Post-traumatic osteoarthritis of right ankle 04/08/2021   Encounter for general adult medical examination with abnormal findings 12/15/2020   History of COVID-19 12/15/2020   Trochanteric bursitis, left hip 10/08/2020   Patellofemoral syndrome of right knee 02/23/2020   Proteinuria 01/19/2020   Arthritis of right knee 08/21/2019   Osteoarthritis of ankle and foot 08/21/2019   Nocturia 08/05/2019   Hamstring strain 08/05/2019   History of abnormal cervical Pap smear 07/09/2018   Polyp of sigmoid colon    Menorrhagia with irregular cycle 06/04/2018   Left arm weakness 02/20/2018   Left elbow pain 02/05/2018   Hypokalemia 02/05/2018   Thiamine deficiency 01/17/2018   Arthralgia 09/18/2017   Tick bite 09/18/2017   Vitamin D deficiency 09/18/2017   Hashimoto's disease 09/18/2017   Vision loss 09/18/2017   Hives 03/13/2017   Constipation 04/28/2016   Right ankle pain 04/28/2016   Abnormal uterine bleeding 02/07/2016   Iron deficiency 02/04/2016   Late period 11/24/2015   Special screening for malignant neoplasms, colon 10/14/2015   Skin nodule 09/20/2015   Status post left partial knee replacement, medial aspect 07/09/2015   Obesity (BMI 30.0-34.9) 07/09/2015   Knee osteochondritis dessicans 05/25/2015   Plantar fasciitis 05/13/2015   Iron deficiency anemia 01/14/2015   Hypothyroidism 10/01/2014   Left knee pain 10/01/2014   Cramps, extremity 10/01/2014   Allergic rhinitis 10/01/2014   S/P gastric bypass 10/01/2014    Past Medical History:  Diagnosis Date   Arthritis    OA   Complication of anesthesia    "ITCHING  TO FACE, BENADRYL HELP BUT DROPS BLOOD PRESSURE"   DVT (deep venous thrombosis) (HCC)    following first knee surgery in 2011   Gastric ulcer    Headache    Heterozygous factor V Leiden mutation (HCC)    Hives    chronic   Hypothyroid    Iron deficiency anemia    Migraine     Family History  Problem Relation Age of Onset   Stroke Mother    Heart attack Mother    AAA (abdominal aortic aneurysm) Mother    Kidney cancer Mother    Atrial fibrillation Mother    Diabetes Father    Dementia Father    Breast cancer Sister 63   Leukemia Sister    Breast cancer Maternal Grandmother        late 50's   Breast cancer Maternal Aunt 4       BRACA + dx twice age 43 and 23   Arthritis Other        parent   Hyperlipidemia Other        parent   Stroke Other        parent   Hypertension Other        parent   Kidney  disease Other        parent, grandparent   Diabetes Other        parent, grandparent   Renal cancer Other        parent   Past Surgical History:  Procedure Laterality Date   ANKLE FRACTURE SURGERY     1994, 1996   CHOLECYSTECTOMY     1998   COLONOSCOPY WITH PROPOFOL N/A 06/14/2018   Procedure: COLONOSCOPY WITH BIOPSY;  Surgeon: Midge Minium, MD;  Location: [facility];  Service: Endoscopy;  Laterality: N/A;   DILATION AND CURETTAGE OF UTERUS     1994, 2001   ESOPHAGOGASTRODUODENOSCOPY (EGD) WITH PROPOFOL N/A 06/14/2018   Procedure: ESOPHAGOGASTRODUODENOSCOPY (EGD) WITH PROPOFOL;  Surgeon: Midge Minium, MD;  Location: [facility];  Service: Endoscopy;  Laterality: N/A;   FOREIGN BODY REMOVAL N/A 06/14/2018   Procedure: FOREIGN BODY REMOVAL;  Surgeon: Midge Minium, MD;  Location: [facility];  Service: Endoscopy;  Laterality: N/A;  Staple Removal from Stomach   GASTRIC BYPASS     2012, revision 2013   KNEE ARTHROSCOPY     2011, 2013, 2015   KNEE SURGERY Right 09/21/2021   POLYPECTOMY N/A 06/14/2018   Procedure: POLYPECTOMY;  Surgeon: Midge Minium, MD;  Location: [facility];  Service: Endoscopy;  Laterality: N/A;   TOOTH EXTRACTION     2010   TOTAL KNEE ARTHROPLASTY Left 05/25/2015   Procedure: LEFT TOTAL KNEE ARTHROPLASTY;  Surgeon: Sheral Apley, MD;  Location: [facility];  Service: Orthopedics;  Laterality: Left;   TUBAL LIGATION     2004   Social History   Social History Narrative   Not on file   Immunization History  Administered Date(s) Administered   Influenza Inj Mdck Quad Pf 02/01/2020   Influenza, Seasonal, Injecte, Preservative Fre 10/17/2018   Influenza,inj,Quad PF,6+ Mos 01/29/2015, 09/20/2015, 03/13/2017, 01/19/2018, 10/17/2018, 01/29/2019, 12/15/2020, 01/23/2022   Influenza-Unspecified 01/29/2015, 09/20/2015, 03/13/2017, 01/19/2018, 10/17/2018, 01/29/2019   PFIZER Comirnaty(Gray Top)Covid-19 Tri-Sucrose Vaccine 03/26/2019, 04/16/2019   PFIZER(Purple Top)SARS-COV-2 Vaccination 03/26/2019, 04/16/2019, 02/01/2020, 08/11/2020   Td 03/13/2017   Zoster Recombinat (Shingrix) 08/11/2020     Objective: Vital Signs: BP 124/84 (BP Location: Right Arm, Patient Position: Sitting, Cuff Size: Normal)   Pulse 77   Resp 16   Ht 5' 6.25" (1.683 m)   Wt 218 lb (98.9 kg)   LMP 08/24/2018 (Exact Date)   BMI 34.92 kg/m    Physical Exam Constitutional:      Appearance: She is obese.  HENT:     Mouth/Throat:     Mouth: Mucous membranes are moist.     Pharynx: Oropharynx is clear.  Eyes:     Conjunctiva/sclera: Conjunctivae normal.  Cardiovascular:     Rate and Rhythm: Normal rate and regular rhythm.  Pulmonary:     Effort: Pulmonary effort is normal.     Breath sounds: Normal breath sounds.  Musculoskeletal:     Right lower leg: No edema.     Left lower leg: No edema.  Lymphadenopathy:     Cervical: No cervical adenopathy.  Skin:    General: Skin is warm and dry.     Findings: No rash.  Neurological:     Mental Status: She is alert.  Psychiatric:        Mood and Affect: Mood normal.       Musculoskeletal Exam:  Shoulders full ROM no tenderness or swelling Elbows full ROM no tenderness or swelling Wrists full ROM no tenderness or swelling Fingers  full ROM no tenderness or swelling Lateral hip tenderness to pressure on both sides Knees no palpable swelling, left knee crepitus, minimal tenderness to pressure Right ankle restricted ROM  Investigation: No additional findings.  Imaging: XR Ankle Complete Right  Result Date: 05/02/2022 2 view radiographs of the right ankle shows traumatic arthritis of the tibiotalar joint there is calcification across the syndesmosis and there is a retained wire from a bone stimulator that is within the fibula.  XR Knee 1-2 Views Right  Result Date: 05/02/2022 2 view radiographs of the right knee shows mild medial joint line narrowing with no subcondylar cyst or sclerosis.  There is a unicompartmental arthroplasty in the left knee.   Recent Labs: Lab Results  Component Value Date   WBC 8.7 12/08/2020   HGB 13.5 12/08/2020   PLT 320 12/08/2020   NA 141 12/08/2021   K 3.6 12/08/2021   CL 108 12/08/2021   CO2 27 12/08/2021   GLUCOSE 95 12/08/2021   BUN 16 12/08/2021   CREATININE 0.79 12/08/2021   BILITOT 0.4 12/08/2021   ALKPHOS 84 12/08/2021   AST 15 12/08/2021   ALT 22 12/08/2021   PROT 6.4 12/08/2021   ALBUMIN 4.2 12/08/2021   CALCIUM 8.9 12/08/2021   GFRAA >60 10/02/2019    Speciality Comments: No specialty comments available.  Procedures:  No procedures performed Allergies: Paxil [paroxetine hcl] and Penicillins   Assessment / Plan:     Visit Diagnoses: Positive ANA (antinuclear antibody) - Plan: RNP Antibody, Anti-Smith antibody, Sjogrens syndrome-A extractable nuclear antibody, Sjogrens syndrome-B extractable nuclear antibody, Anti-DNA antibody, double-stranded, C3 and C4, Sedimentation rate, C-reactive protein  Positive ANA does not have any specific clinical criteria on exam.  The reported rash photosensitivity  but is otherwise most consistent with chronic urticaria.  No red flags for cutaneous vasculitis.  No peripheral joint synovitis appreciable on exam.  Will check antibody panel as above also checking serum inflammatory markers and complements for any evidence of ongoing inflammatory process.  If these are significantly abnormal might benefit with trial of medication though would still be categorized as undifferentiated disease.  If workup is negative I think symptoms could be explained by her other diagnoses and chronic pain due to fibromyalgia.  Fibromyalgia  Widespread body aches out of proportion to joint damage or inflammation, chronic migraines, fatigue and concentration difficulty.  Not sure if her diarrhea would be related as there are other causes to explain this.  Burning muscular type pain could also be seen in setting of a small fiber neuropathy but think this is less likely unless more evidence of inflammation with workup.  Hashimoto's disease  Review of most recent labs thyroid function appears to be in target range on her current replacement.  Would also be a possible cause of positive ANA and absence of other clinical criteria.  Hives  Symptoms of chronic idiopathic urticaria.  Discussed these can also be seen associated with positive ANA but not always underlying autoimmune connective tissue disease.  She previously had and what sounds like an excellent response to Xolair so can benefit from following up in allergy clinic.  Orders: Orders Placed This Encounter  Procedures   RNP Antibody   Anti-Smith antibody   Sjogrens syndrome-A extractable nuclear antibody   Sjogrens syndrome-B extractable nuclear antibody   Anti-DNA antibody, double-stranded   C3 and C4   Sedimentation rate   C-reactive protein   No orders of the defined types were placed in this encounter.   Follow-Up Instructions:  No follow-ups on file.   Fuller Plan, MD  Note - This record has been  created using [employer].  Chart creation errors have been sought, but may not always  have been located. Such creation errors do not reflect on  the standard of medical care.

## 2022-05-19 ENCOUNTER — Ambulatory Visit: Payer: Commercial Managed Care - HMO | Attending: Internal Medicine | Admitting: Internal Medicine

## 2022-05-19 ENCOUNTER — Encounter: Payer: Self-pay | Admitting: Internal Medicine

## 2022-05-19 ENCOUNTER — Other Ambulatory Visit: Payer: Self-pay

## 2022-05-19 ENCOUNTER — Telehealth: Payer: Self-pay | Admitting: Orthopedic Surgery

## 2022-05-19 VITALS — BP 124/84 | HR 77 | Resp 16 | Ht 66.25 in | Wt 218.0 lb

## 2022-05-19 DIAGNOSIS — L509 Urticaria, unspecified: Secondary | ICD-10-CM

## 2022-05-19 DIAGNOSIS — M797 Fibromyalgia: Secondary | ICD-10-CM

## 2022-05-19 DIAGNOSIS — G8929 Other chronic pain: Secondary | ICD-10-CM

## 2022-05-19 DIAGNOSIS — E063 Autoimmune thyroiditis: Secondary | ICD-10-CM | POA: Diagnosis not present

## 2022-05-19 DIAGNOSIS — M1711 Unilateral primary osteoarthritis, right knee: Secondary | ICD-10-CM

## 2022-05-19 DIAGNOSIS — M2550 Pain in unspecified joint: Secondary | ICD-10-CM

## 2022-05-19 DIAGNOSIS — R768 Other specified abnormal immunological findings in serum: Secondary | ICD-10-CM | POA: Diagnosis not present

## 2022-05-19 NOTE — Telephone Encounter (Signed)
Did Dr. Lajoyce Corners fill out a blue she for this pt's ankle surgery?  Ignacia Bayley please see message below. I entered the MRI order today it does not look like it has been done and the pt was in the office 2 weeks ago. Can we get this sch ASAP please?

## 2022-05-19 NOTE — Telephone Encounter (Signed)
noted 

## 2022-05-19 NOTE — Patient Instructions (Signed)
I am checking some additional specific labs for evidence of autoimmune or inflammatory process causing your symptoms. If positive we will need to follow up and discuss treatment options.  If results are all reassuring you may be able to follow up with your existing doctors we will contact them about the plan.  Fibromyalgia is also possible contributing to widespread pain and also to migraine headaches.  I recommend checking out the [facility] patient-centered guide for fibromyalgia and chronic pain management and see if any of this is helpful for non-medication management options: [email or web address]://[email or web address].[email or web address]

## 2022-05-19 NOTE — Telephone Encounter (Signed)
Patient called checking on her MRI and surgery date. Patient asked for a call back when MRI is sent and surgery can be scheduled. The number to contact patient is [phone]

## 2022-05-20 LAB — SEDIMENTATION RATE: Sed Rate: 11 mm/h (ref 0–30)

## 2022-05-20 LAB — ANTI-SMITH ANTIBODY: ENA SM Ab Ser-aCnc: <1 AI

## 2022-05-20 LAB — RNP ANTIBODY: Ribonucleic Protein(ENA) Antibody, IgG: <1 AI

## 2022-05-20 LAB — SJOGRENS SYNDROME-B EXTRACTABLE NUCLEAR ANTIBODY: SSB (La) (ENA) Antibody, IgG: <1 AI

## 2022-05-20 LAB — C3 AND C4
C3 Complement: 153 mg/dL (ref 83–193)
C4 Complement: 28 mg/dL (ref 15–57)

## 2022-05-20 LAB — SJOGRENS SYNDROME-A EXTRACTABLE NUCLEAR ANTIBODY: SSA (Ro) (ENA) Antibody, IgG: <1 AI

## 2022-05-20 LAB — ANTI-DNA ANTIBODY, DOUBLE-STRANDED: ds DNA Ab: <1 IU/mL

## 2022-05-20 LAB — C-REACTIVE PROTEIN: CRP: <3 mg/L (ref <8.0)

## 2022-05-21 ENCOUNTER — Other Ambulatory Visit: Payer: Self-pay | Admitting: Family Medicine

## 2022-05-23 ENCOUNTER — Encounter: Payer: Self-pay | Admitting: Orthopedic Surgery

## 2022-05-23 ENCOUNTER — Other Ambulatory Visit (HOSPITAL_BASED_OUTPATIENT_CLINIC_OR_DEPARTMENT_OTHER): Payer: Self-pay

## 2022-05-24 ENCOUNTER — Ambulatory Visit
Admission: RE | Admit: 2022-05-24 | Discharge: 2022-05-24 | Disposition: A | Discharge status: Home / Self Care | Payer: Commercial Managed Care - HMO | Source: Ambulatory Visit | Attending: Orthopedic Surgery | Admitting: Orthopedic Surgery

## 2022-05-24 DIAGNOSIS — G8929 Other chronic pain: Secondary | ICD-10-CM

## 2022-05-24 DIAGNOSIS — M1711 Unilateral primary osteoarthritis, right knee: Secondary | ICD-10-CM

## 2022-05-29 NOTE — Addendum Note (Signed)
Addended by: Deneise Lever on: 05/29/2022 11:05 AM   Modules accepted: Level of Service

## 2022-05-31 ENCOUNTER — Other Ambulatory Visit: Payer: Self-pay | Admitting: Family Medicine

## 2022-06-01 ENCOUNTER — Other Ambulatory Visit (HOSPITAL_BASED_OUTPATIENT_CLINIC_OR_DEPARTMENT_OTHER): Payer: Self-pay

## 2022-06-02 ENCOUNTER — Other Ambulatory Visit: Payer: Self-pay | Admitting: Family Medicine

## 2022-06-02 ENCOUNTER — Other Ambulatory Visit (HOSPITAL_BASED_OUTPATIENT_CLINIC_OR_DEPARTMENT_OTHER): Payer: Self-pay

## 2022-06-05 MED ORDER — SUMATRIPTAN SUCCINATE 50 MG PO TABS
50.0000 mg | ORAL_TABLET | ORAL | 0 refills | Status: DC | PRN
  Filled 2022-06-05: qty 9, 6d supply, fill #0

## 2022-06-06 ENCOUNTER — Other Ambulatory Visit (HOSPITAL_BASED_OUTPATIENT_CLINIC_OR_DEPARTMENT_OTHER): Payer: Self-pay

## 2022-06-06 ENCOUNTER — Other Ambulatory Visit: Payer: Self-pay | Admitting: Family Medicine

## 2022-06-06 NOTE — Telephone Encounter (Signed)
Please contact the patient and see how often she is having to use this medication.

## 2022-06-08 ENCOUNTER — Other Ambulatory Visit: Payer: Self-pay

## 2022-06-08 ENCOUNTER — Ambulatory Visit (INDEPENDENT_AMBULATORY_CARE_PROVIDER_SITE_OTHER): Payer: Commercial Managed Care - HMO | Admitting: Behavioral Health

## 2022-06-08 ENCOUNTER — Encounter: Payer: Self-pay | Admitting: Family Medicine

## 2022-06-08 DIAGNOSIS — F32A Depression, unspecified: Secondary | ICD-10-CM

## 2022-06-08 DIAGNOSIS — F419 Anxiety disorder, unspecified: Secondary | ICD-10-CM | POA: Diagnosis not present

## 2022-06-08 MED ORDER — SUMATRIPTAN SUCCINATE 50 MG PO TABS: 50.0000 mg | ORAL_TABLET | ORAL | 0 refills | Status: DC | PRN

## 2022-06-08 NOTE — Progress Notes (Signed)
                 L , LMFT 

## 2022-06-08 NOTE — Progress Notes (Addendum)
[facility] Counselor/Therapist Progress Note  Patient ID: Whitney Hill, MRN: [id number],    Date: 06/08/2022  Time Spent: 55 min Caregility video; Pt is in bed resting & watching 3 dogs that belong to her Son Whitney Hill. Provider is working remote from [employer].  Treatment Type: Individual Therapy  Reported Symptoms: Elevated anx/dep & stress due to Mother's pending surgery & the other demands of Family.  Mental Status Exam: Appearance:  Casual     Behavior: Appropriate and Sharing  Motor: Normal  Speech/Language:  Clear and Coherent  Affect: Appropriate  Mood: normal  Thought process: normal  Thought content:   WNL  Sensory/Perceptual disturbances:   WNL  Orientation: oriented to person, place, and time/date  Attention: Good  Concentration: Good  Memory: WNL  Fund of knowledge:  Good  Insight:   Good  Judgment:  Good  Impulse Control: Good   Risk Assessment: Danger to Self:  No Self-injurious Behavior: No Danger to Others: No Duty to Warn:no Physical Aggression / Violence:No  Access to Firearms a concern: No  Gang Involvement:No   Subjective: Pt is deeply worried for her Mother's health status changes & upcoming surgery to her cardiac aneurysm. She is under the pressure to communicate w/her Siblings who often do not return her calls.    Interventions: Family Systems  Diagnosis:Anxiety and depression  Plan: Whitney Hill will request as much help as she needs from her Siblings to alert them to her Mother's health issues that are pressing. She is exploring the financial impact of her current home & what she will do in the near future. She plans to determine this once she gets to the Fall.   Target Date: 07/09/2022  Progress: 5  Frequency: Once every 3-4 wks  Modality: Claretta Fraise, LMFT

## 2022-06-12 DIAGNOSIS — M797 Fibromyalgia: Secondary | ICD-10-CM | POA: Insufficient documentation

## 2022-06-13 ENCOUNTER — Ambulatory Visit: Payer: Commercial Managed Care - HMO | Admitting: Orthopedic Surgery

## 2022-06-13 DIAGNOSIS — M1711 Unilateral primary osteoarthritis, right knee: Secondary | ICD-10-CM

## 2022-06-13 DIAGNOSIS — M12571 Traumatic arthropathy, right ankle and foot: Secondary | ICD-10-CM | POA: Diagnosis not present

## 2022-06-23 ENCOUNTER — Encounter: Payer: Self-pay | Admitting: Orthopedic Surgery

## 2022-06-23 DIAGNOSIS — M12571 Traumatic arthropathy, right ankle and foot: Secondary | ICD-10-CM | POA: Diagnosis not present

## 2022-06-23 MED ORDER — LIDOCAINE HCL (PF) 1 % IJ SOLN
5.0000 mL | INTRAMUSCULAR | Status: AC | PRN
Start: 2022-06-23 — End: 2022-06-23
  Administered 2022-06-23: 5 mL

## 2022-06-23 MED ORDER — METHYLPREDNISOLONE ACETATE 40 MG/ML IJ SUSP
40.0000 mg | INTRAMUSCULAR | Status: AC | PRN
Start: 2022-06-23 — End: 2022-06-23
  Administered 2022-06-23: 40 mg via INTRA_ARTICULAR

## 2022-06-23 NOTE — Progress Notes (Signed)
Office Visit Note   Patient: Whitney Hill           Date of Birth: 23-Apr-1970           MRN: [id number] Visit Date: 06/13/2022              Requested by: Glori Luis, MD [address] [place],  [place] [address] PCP: Glori Luis, MD  Chief Complaint  Patient presents with   Right Ankle - Follow-up    Discuss surgery   Right Knee - Follow-up    MRI review      HPI: Patient is a 52 year old woman who presents for right knee and right ankle pain.  She is status post an MRI scan of the right knee.  Patient has traumatic arthritis of the right ankle she has had several injections in the ankle and still has pain with activities of daily living.  Pain primarily anteriorly over the ankle.  Patient is status post a left medial joint line knee arthroplasty and states she has had multiple surgeries on the right knee.  Patient states that prior to the unicompartmental arthroplasty she had 3 arthroscopic debridements.  Patient states that she is status post 6 surgeries for the right knee including casting for 3 months.  Patient is also had a bone stimulator.  Patient has pain to palpation anteriorly over the right ankle joint line there is no pain with subtalar motion no pain to palpation of the sinus Tarsi.  Patient has crepitation and pain with range of motion of the right ankle pain over the medial joint line. Assessment & Plan: Visit Diagnoses:  1. Traumatic arthritis of right ankle   2. Arthritis of right knee     Plan: Patient states she would like to proceed with arthroscopic debridement of the right ankle.  We will set this up at her convenience.  Risk and benefits were discussed including persistent pain need for additional surgery.  Patient states she understands wishes to proceed at this time.  Follow-Up Instructions: No follow-ups on file.   Ortho Exam  Patient is alert, oriented, no adenopathy, well-dressed, normal affect, normal respiratory  effort. Review of the MRI scan of the right knee shows a medial meniscal tear with articular cartilage loss of the medial joint line.  There is cartilage abnormalities in the patellofemoral joint.  Review the radiographs of the right ankle shows degenerative arthritic changes with calcification of the syndesmosis and a retained wire from a bone stimulator.  Imaging: No results found. No images are attached to the encounter.  Labs: Lab Results  Component Value Date   HGBA1C 5.9 12/15/2020   HGBA1C 5.7 08/05/2019   HGBA1C 5.4 01/14/2018   ESRSEDRATE 11 05/19/2022   ESRSEDRATE 16 09/18/2017   CRP <3.0 05/19/2022   CRP 0.2 (L) 09/18/2017   REPTSTATUS 08/15/2019 FINAL 08/13/2019   CULT >=100,000 COLONIES/mL ESCHERICHIA COLI (A) 08/13/2019   LABORGA ESCHERICHIA COLI (A) 08/13/2019     Lab Results  Component Value Date   ALBUMIN 4.2 12/08/2021   ALBUMIN 4.3 12/15/2020   ALBUMIN 4.3 12/08/2020    Lab Results  Component Value Date   MG 2.0 01/18/2018   Lab Results  Component Value Date   VD25OH 16.76 (L) 04/19/2022   VD25OH 8.75 (L) 01/23/2022   VD25OH 21.57 (L) 01/14/2018    No results found for: "PREALBUMIN"    Latest Ref Rng & Units 12/08/2020   12:10 AM 10/22/2020    3:27  PM 04/08/2020   10:56 AM  CBC EXTENDED  WBC 4.0 - 10.5 K/uL 8.7  7.9  7.2   RBC 3.87 - 5.11 MIL/uL 4.15  4.02  4.23   Hemoglobin 12.0 - 15.0 g/dL 40.9  81.1  91.4   HCT 36.0 - 46.0 % 40.0  39.4  41.3   Platelets 150 - 400 K/uL 320  321  308   NEUT# 1.7 - 7.7 K/uL  4.9  4.3   Lymph# 0.7 - 4.0 K/uL  2.3  2.3      There is no height or weight on file to calculate BMI.  Orders:  No orders of the defined types were placed in this encounter.  No orders of the defined types were placed in this encounter.    Procedures: Large Joint Inj: R knee on 06/23/2022 5:15 PM Indications: pain and diagnostic evaluation Details: 22 G 1.5 in needle, anterolateral approach  Arthrogram: No  Medications:  5 mL lidocaine (PF) 1 %; 40 mg methylPREDNISolone acetate 40 MG/ML Outcome: tolerated well, no immediate complications Procedure, treatment alternatives, risks and benefits explained, specific risks discussed. Consent was given by the patient. Immediately prior to procedure a time out was called to verify the correct patient, procedure, equipment, support staff and site/side marked as required. Patient was prepped and draped in the usual sterile fashion.      Clinical Data: No additional findings.  ROS:  All other systems negative, except as noted in the HPI. Review of Systems  Objective: Vital Signs: LMP 08/24/2018 (Exact Date)   Specialty Comments:  No specialty comments available.  PMFS History: Patient Active Problem List   Diagnosis Date Noted   Fibromyalgia 06/12/2022   Positive ANA (antinuclear antibody) 05/19/2022   Anxiety and depression 01/23/2022   Breast pain, left 12/08/2021   New onset of headaches after age 66 12/08/2021   Itchy skin 06/22/2021   Endometrial polyp 06/08/2021   Rib pain on left side 06/08/2021   History of gastric ulcer 06/08/2021   Urine frequency 06/08/2021   Post-traumatic osteoarthritis of right ankle 04/08/2021   Encounter for general adult medical examination with abnormal findings 12/15/2020   History of COVID-19 12/15/2020   Trochanteric bursitis, left hip 10/08/2020   Patellofemoral syndrome of right knee 02/23/2020   Proteinuria 01/19/2020   Arthritis of right knee 08/21/2019   Osteoarthritis of ankle and foot 08/21/2019   Nocturia 08/05/2019   Hamstring strain 08/05/2019   History of abnormal cervical Pap smear 07/09/2018   Polyp of sigmoid colon    Menorrhagia with irregular cycle 06/04/2018   Left arm weakness 02/20/2018   Left elbow pain 02/05/2018   Hypokalemia 02/05/2018   Thiamine deficiency 01/17/2018   Arthralgia 09/18/2017   Tick bite 09/18/2017   Vitamin D deficiency 09/18/2017   Hashimoto's disease 09/18/2017    Vision loss 09/18/2017   Hives 03/13/2017   Constipation 04/28/2016   Right ankle pain 04/28/2016   Abnormal uterine bleeding 02/07/2016   Iron deficiency 02/04/2016   Late period 11/24/2015   Special screening for malignant neoplasms, colon 10/14/2015   Skin nodule 09/20/2015   Status post left partial knee replacement, medial aspect 07/09/2015   Obesity (BMI 30.0-34.9) 07/09/2015   Knee osteochondritis dessicans 05/25/2015   Plantar fasciitis 05/13/2015   Iron deficiency anemia 01/14/2015   Hypothyroidism 10/01/2014   Left knee pain 10/01/2014   Cramps, extremity 10/01/2014   Allergic rhinitis 10/01/2014   S/P gastric bypass 10/01/2014   Past Medical History:  Diagnosis Date  Arthritis    OA   Complication of anesthesia    "ITCHING TO FACE, BENADRYL HELP BUT DROPS BLOOD PRESSURE"   DVT (deep venous thrombosis) (HCC)    following first knee surgery in 2011   Gastric ulcer    Headache    Heterozygous factor V Leiden mutation (HCC)    Hives    chronic   Hypothyroid    Iron deficiency anemia    Migraine     Family History  Problem Relation Age of Onset   Stroke Mother    Heart attack Mother    AAA (abdominal aortic aneurysm) Mother    Kidney cancer Mother    Atrial fibrillation Mother    Diabetes Father    Dementia Father    Breast cancer Sister 25   Leukemia Sister    Breast cancer Maternal Grandmother        late 50's   Breast cancer Maternal Aunt 56       BRACA + dx twice age 7 and 67   Arthritis Other        parent   Hyperlipidemia Other        parent   Stroke Other        parent   Hypertension Other        parent   Kidney disease Other        parent, grandparent   Diabetes Other        parent, grandparent   Renal cancer Other        parent    Past Surgical History:  Procedure Laterality Date   ANKLE FRACTURE SURGERY     1994, 1996   CHOLECYSTECTOMY     1998   COLONOSCOPY WITH PROPOFOL N/A 06/14/2018   Procedure: COLONOSCOPY WITH  BIOPSY;  Surgeon: Midge Minium, MD;  Location: [facility];  Service: Endoscopy;  Laterality: N/A;   DILATION AND CURETTAGE OF UTERUS     1994, 2001   ESOPHAGOGASTRODUODENOSCOPY (EGD) WITH PROPOFOL N/A 06/14/2018   Procedure: ESOPHAGOGASTRODUODENOSCOPY (EGD) WITH PROPOFOL;  Surgeon: Midge Minium, MD;  Location: [facility];  Service: Endoscopy;  Laterality: N/A;   FOREIGN BODY REMOVAL N/A 06/14/2018   Procedure: FOREIGN BODY REMOVAL;  Surgeon: Midge Minium, MD;  Location: [facility];  Service: Endoscopy;  Laterality: N/A;  Staple Removal from Stomach   GASTRIC BYPASS     2012, revision 2013   KNEE ARTHROSCOPY     2011, 2013, 2015   KNEE SURGERY Right 09/21/2021   POLYPECTOMY N/A 06/14/2018   Procedure: POLYPECTOMY;  Surgeon: Midge Minium, MD;  Location: [facility];  Service: Endoscopy;  Laterality: N/A;   TOOTH EXTRACTION     2010   TOTAL KNEE ARTHROPLASTY Left 05/25/2015   Procedure: LEFT TOTAL KNEE ARTHROPLASTY;  Surgeon: Sheral Apley, MD;  Location: [facility];  Service: Orthopedics;  Laterality: Left;   TUBAL LIGATION     2004   Social History   Occupational History   Not on file  Tobacco Use   Smoking status: Never    Passive exposure: Current   Smokeless tobacco: Never  Vaping Use   Vaping Use: Never used  Substance and Sexual Activity   Alcohol use: No    Alcohol/week: 0.0 standard drinks of alcohol   Drug use: No   Sexual activity: Not on file

## 2022-06-30 ENCOUNTER — Ambulatory Visit: Payer: Commercial Managed Care - HMO | Admitting: Behavioral Health

## 2022-07-05 ENCOUNTER — Other Ambulatory Visit: Payer: Self-pay | Admitting: Family Medicine

## 2022-07-10 ENCOUNTER — Other Ambulatory Visit (HOSPITAL_BASED_OUTPATIENT_CLINIC_OR_DEPARTMENT_OTHER): Payer: Self-pay

## 2022-07-10 MED ORDER — COVID-19 MRNA VAC-TRIS(PFIZER) 30 MCG/0.3ML IM SUSY
0.3000 mL | PREFILLED_SYRINGE | Freq: Once | INTRAMUSCULAR | 0 refills | Status: AC
  Filled 2022-07-10: qty 0.3, 1d supply, fill #0

## 2022-07-11 ENCOUNTER — Other Ambulatory Visit (HOSPITAL_BASED_OUTPATIENT_CLINIC_OR_DEPARTMENT_OTHER): Payer: Self-pay

## 2022-07-11 ENCOUNTER — Encounter: Payer: Self-pay | Admitting: Family Medicine

## 2022-07-14 ENCOUNTER — Other Ambulatory Visit (HOSPITAL_BASED_OUTPATIENT_CLINIC_OR_DEPARTMENT_OTHER): Payer: Self-pay

## 2022-07-14 ENCOUNTER — Telehealth: Payer: Self-pay | Admitting: Radiology

## 2022-07-14 NOTE — Telephone Encounter (Signed)
Patient called, asked to speak with [name] about surgery date.  Please call her to discuss.

## 2022-07-17 ENCOUNTER — Other Ambulatory Visit (HOSPITAL_BASED_OUTPATIENT_CLINIC_OR_DEPARTMENT_OTHER): Payer: Self-pay

## 2022-07-17 ENCOUNTER — Other Ambulatory Visit: Payer: Self-pay | Admitting: Family Medicine

## 2022-07-17 MED ORDER — ZEPBOUND 7.5 MG/0.5ML ~~LOC~~ SOAJ: 7.5000 mg | SUBCUTANEOUS | 2 refills | Status: DC

## 2022-07-17 MED FILL — Sumatriptan Succinate Tab 50 MG: ORAL | 30 days supply | Qty: 9 | Fill #0 | Status: AC

## 2022-07-18 NOTE — Telephone Encounter (Signed)
PA needed

## 2022-07-19 ENCOUNTER — Other Ambulatory Visit (HOSPITAL_COMMUNITY): Payer: Self-pay

## 2022-07-21 ENCOUNTER — Ambulatory Visit: Payer: Commercial Managed Care - HMO | Admitting: Family Medicine

## 2022-07-28 NOTE — Telephone Encounter (Signed)
Noted. Please relay to the patient that zepbound is not covered by her insurance.

## 2022-07-31 ENCOUNTER — Encounter: Payer: Self-pay | Admitting: Family Medicine

## 2022-07-31 ENCOUNTER — Ambulatory Visit (INDEPENDENT_AMBULATORY_CARE_PROVIDER_SITE_OTHER): Payer: Commercial Managed Care - HMO | Admitting: Family Medicine

## 2022-07-31 VITALS — BP 116/68 | HR 82 | Temp 97.7°F | Ht 66.25 in | Wt 218.2 lb

## 2022-07-31 DIAGNOSIS — R519 Headache, unspecified: Secondary | ICD-10-CM

## 2022-07-31 DIAGNOSIS — E519 Thiamine deficiency, unspecified: Secondary | ICD-10-CM | POA: Diagnosis not present

## 2022-07-31 DIAGNOSIS — G8929 Other chronic pain: Secondary | ICD-10-CM

## 2022-07-31 DIAGNOSIS — E669 Obesity, unspecified: Secondary | ICD-10-CM

## 2022-07-31 DIAGNOSIS — Z9884 Bariatric surgery status: Secondary | ICD-10-CM | POA: Diagnosis not present

## 2022-07-31 DIAGNOSIS — E538 Deficiency of other specified B group vitamins: Secondary | ICD-10-CM | POA: Diagnosis not present

## 2022-07-31 DIAGNOSIS — M25571 Pain in right ankle and joints of right foot: Secondary | ICD-10-CM

## 2022-07-31 DIAGNOSIS — E559 Vitamin D deficiency, unspecified: Secondary | ICD-10-CM

## 2022-07-31 DIAGNOSIS — E039 Hypothyroidism, unspecified: Secondary | ICD-10-CM | POA: Diagnosis not present

## 2022-07-31 LAB — LIPID PANEL
Cholesterol: 179 mg/dL (ref 0–200)
HDL: 49.8 mg/dL (ref >39.00)
LDL Cholesterol: 96 mg/dL (ref 0–99)
NonHDL: 129.5
Total CHOL/HDL Ratio: 4
Triglycerides: 170 mg/dL — ABNORMAL HIGH (ref 0.0–149.0)
VLDL: 34 mg/dL (ref 0.0–40.0)

## 2022-07-31 LAB — TSH: TSH: 4.67 u[IU]/mL (ref 0.35–5.50)

## 2022-07-31 LAB — COMPREHENSIVE METABOLIC PANEL WITH GFR
ALT: 18 U/L (ref 0–35)
AST: 24 U/L (ref 0–37)
Albumin: 4.1 g/dL (ref 3.5–5.2)
Alkaline Phosphatase: 99 U/L (ref 39–117)
BUN: 17 mg/dL (ref 6–23)
CO2: 20 meq/L (ref 19–32)
Calcium: 9.2 mg/dL (ref 8.4–10.5)
Chloride: 109 meq/L (ref 96–112)
Creatinine, Ser: 0.79 mg/dL (ref 0.40–1.20)
GFR: 84.84 mL/min
Glucose, Bld: 78 mg/dL (ref 70–99)
Potassium: 3.5 meq/L (ref 3.5–5.1)
Sodium: 140 meq/L (ref 135–145)
Total Bilirubin: 0.4 mg/dL (ref 0.2–1.2)
Total Protein: 6.3 g/dL (ref 6.0–8.3)

## 2022-07-31 LAB — VITAMIN B12: Vitamin B-12: 300 pg/mL (ref 211–911)

## 2022-07-31 LAB — IBC + FERRITIN
Ferritin: 67.1 ng/mL (ref 10.0–291.0)
Iron: 99 ug/dL (ref 42–145)
Saturation Ratios: 30.1 % (ref 20.0–50.0)
TIBC: 329 ug/dL (ref 250.0–450.0)
Transferrin: 235 mg/dL (ref 212.0–360.0)

## 2022-07-31 LAB — CBC
HCT: 40.8 % (ref 36.0–46.0)
Hemoglobin: 13.3 g/dL (ref 12.0–15.0)
MCHC: 32.5 g/dL (ref 30.0–36.0)
MCV: 98.2 fl (ref 78.0–100.0)
Platelets: 297 10*3/uL (ref 150.0–400.0)
RBC: 4.15 Mil/uL (ref 3.87–5.11)
RDW: 13.7 % (ref 11.5–15.5)
WBC: 6.7 10*3/uL (ref 4.0–10.5)

## 2022-07-31 LAB — VITAMIN D 25 HYDROXY (VIT D DEFICIENCY, FRACTURES): VITD: 14.52 ng/mL — ABNORMAL LOW (ref 30.00–100.00)

## 2022-07-31 LAB — FOLATE: Folate: 2.1 ng/mL — ABNORMAL LOW (ref >5.9)

## 2022-07-31 NOTE — Progress Notes (Signed)
Marikay Alar, MD Phone: [phone]  Whitney Hill is a 52 y.o. female who presents today for f/u.  Obesity: Patient notes she continues to eat small meals every 2 hours.  She is paying for zepbound out-of-pocket.  She is about to start the 7.5 mg dose.  She reports when she was on the 10 mg dose previously this worked the best.  Her exercise is limited by chronic right knee and ankle issues.  Hypothyroidism: Continues on Synthroid.  Chronic right ankle and knee pain: Patient notes she is going to have surgery to clean out her right ankle.  She notes her new orthopedist did an MRI of her right knee and noted she needed a knee replacement at some point.  She did have an injection in her right knee.  She notes at times she has fallen because of her right ankle giving out on her.  Left breast pain: This was evaluated with mammogram and ultrasound that were both negative.  She ended up seeing general surgery and they felt this was unrelated to a breast issue.  They thought it was most likely musculoskeletal.  Social History   Tobacco Use  Smoking Status Never   Passive exposure: Current  Smokeless Tobacco Never    Current Outpatient Medications on File Prior to Visit  Medication Sig Dispense Refill   cyanocobalamin (VITAMIN B12) 1000 MCG/ML injection Inject 1 mL (1000 mcg total) into the muscle once weekly for 4 weeks and then once monthly 3 mL 3   cyclobenzaprine (FLEXERIL) 5 MG tablet Take 1 tablet (5 mg total) by mouth 3 (three) times daily as needed (muscle pain). 30 tablet 1   diclofenac sodium (VOLTAREN) 1 % GEL Apply 2 g topically 4 (four) times daily.     EPINEPHrine 0.3 mg/0.3 mL IJ SOAJ injection Inject 0.3 mg into the muscle as needed for anaphylaxis. 2 each 0   fluticasone (FLONASE) 50 MCG/ACT nasal spray SPRAY 2 SPRAYS INTO EACH NOSTRIL EVERY DAY 48 mL 2   folic acid (FOLVITE) 1 MG tablet Take 1 mg by mouth daily.     multivitamin-iron-minerals-folic acid (CENTRUM)  chewable tablet Chew 1 tablet by mouth daily.     SUMAtriptan (IMITREX) 50 MG tablet TAKE 1 TABLET BY MOUTH EVERY 2 HOURS AS NEEDED FOR MIGRAINE. MAY REPEAT IN 2 HOURS IF HEADACHE PERSISTS OR RECURS. DO NOT TAKE MORE THAN 2 DOSES IN 24 HOURS. 9 tablet 0   Syringe/Needle, Disp, (SYRINGE 3CC/25GX1") 25G X 1" 3 ML MISC Use with b12 injections 50 each 0   thiamine 100 MG tablet Take 1 tablet (100 mg total) by mouth daily.     tirzepatide (ZEPBOUND) 7.5 MG/0.5ML Pen Inject 7.5 mg into the skin once a week. 2 mL 2   venlafaxine XR (EFFEXOR-XR) 37.5 MG 24 hr capsule TAKE 1 CAPSULE BY MOUTH DAILY WITH BREAKFAST. 90 capsule 1   Vitamin D, Ergocalciferol, (DRISDOL) 1.25 MG (50000 UNIT) CAPS capsule Take 1 capsule (50,000 Units total) by mouth every 7 (seven) days. 12 capsule 1   levothyroxine (SYNTHROID) 150 MCG tablet Take 1 tablet (150 mcg total) by mouth daily before breakfast. 90 tablet 0   No current facility-administered medications on file prior to visit.     ROS see history of present illness  Objective  Physical Exam Vitals:   07/31/22 0923  BP: 116/68  Pulse: 82  Temp: 97.7 F (36.5 C)  SpO2: 98%    BP Readings from Last 3 Encounters:  07/31/22 116/68  05/19/22 124/84  04/19/22 116/72   Wt Readings from Last 3 Encounters:  07/31/22 218 lb 3.2 oz (99 kg)  05/19/22 218 lb (98.9 kg)  04/19/22 206 lb 3.2 oz (93.5 kg)    Physical Exam Constitutional:      General: She is not in acute distress.    Appearance: She is not diaphoretic.  Cardiovascular:     Rate and Rhythm: Normal rate and regular rhythm.     Heart sounds: Normal heart sounds.  Pulmonary:     Effort: Pulmonary effort is normal.     Breath sounds: Normal breath sounds.  Musculoskeletal:     Right lower leg: No edema.     Left lower leg: No edema.  Skin:    General: Skin is warm and dry.  Neurological:     Mental Status: She is alert.      Assessment/Plan: Please see individual problem  list.  Hypothyroidism, unspecified type Assessment & Plan: Chronic issue.  Check TSH.  Continue Synthroid 150 mcg daily.  Orders: -     TSH  Vitamin D deficiency Assessment & Plan: Chronic issue.  Recheck vitamin D.  Continue vitamin D 50,000 units weekly.  Orders: -     VITAMIN D 25 Hydroxy (Vit-D Deficiency, Fractures)  Thiamine deficiency Assessment & Plan: Recheck thiamine level. Patient notes she is no longer on medication for this.   Orders: -     Vitamin B1  Obesity (BMI 30.0-34.9) Assessment & Plan: Obesity is a chronic issue for this patient.  She will start on the zepbound 7.5 mg weekly.  She will monitor for side effects.  If not effective for weight loss over the next 4 weeks and she is not having any side effects then can increase to 10 mg weekly.  She will try to continue to healthy diet.  Exercise is limited given orthopedic issues though she will remain as active as possible.  Orders: -     Lipid panel -     Comprehensive metabolic panel  New onset of headaches after age 77 Assessment & Plan: Patient reports headaches essentially resolved.   S/P gastric bypass Assessment & Plan: Chronic issue.  Check lab work.  Orders: -     Zinc -     Copper, serum -     Folate -     Vitamin E -     Vitamin A -     IBC + Ferritin -     CBC -     Vitamin K1, Serum  Chronic pain of right ankle Assessment & Plan: Chronic issue.  Patient will continue to follow with her orthopedist on this issue.   B12 deficiency -     Vitamin B12     Return in about 3 months (around 10/31/2022) for weight.   Marikay Alar, MD [facility]

## 2022-07-31 NOTE — Assessment & Plan Note (Signed)
Chronic issue.  Check lab work.

## 2022-07-31 NOTE — Assessment & Plan Note (Addendum)
Recheck thiamine level. Patient notes she is no longer on medication for this.

## 2022-07-31 NOTE — Assessment & Plan Note (Signed)
Chronic issue.  Recheck vitamin D.  Continue vitamin D 50,000 units weekly.

## 2022-07-31 NOTE — Assessment & Plan Note (Signed)
Chronic issue.  Check TSH.  Continue Synthroid 150 mcg daily.

## 2022-07-31 NOTE — Assessment & Plan Note (Signed)
Obesity is a chronic issue for this patient.  She will start on the zepbound 7.5 mg weekly.  She will monitor for side effects.  If not effective for weight loss over the next 4 weeks and she is not having any side effects then can increase to 10 mg weekly.  She will try to continue to healthy diet.  Exercise is limited given orthopedic issues though she will remain as active as possible.

## 2022-07-31 NOTE — Assessment & Plan Note (Signed)
Chronic issue.  Patient will continue to follow with her orthopedist on this issue.

## 2022-07-31 NOTE — Assessment & Plan Note (Signed)
Patient reports headaches essentially resolved.

## 2022-08-04 ENCOUNTER — Other Ambulatory Visit: Payer: Self-pay | Admitting: Family Medicine

## 2022-08-04 DIAGNOSIS — E559 Vitamin D deficiency, unspecified: Secondary | ICD-10-CM

## 2022-08-04 MED ORDER — VITAMIN D (ERGOCALCIFEROL) 1.25 MG (50000 UNIT) PO CAPS
50000.0000 [IU] | ORAL_CAPSULE | ORAL | 1 refills | Status: AC
Start: 2022-08-04 — End: ?

## 2022-08-07 ENCOUNTER — Other Ambulatory Visit: Payer: Self-pay

## 2022-08-07 DIAGNOSIS — E538 Deficiency of other specified B group vitamins: Secondary | ICD-10-CM

## 2022-08-07 MED ORDER — SYRINGE 25G X 1" 3 ML MISC
0 refills | Status: AC
Start: 2022-08-07 — End: ?

## 2022-08-07 MED ORDER — CYANOCOBALAMIN 1000 MCG/ML IJ SOLN
INTRAMUSCULAR | 3 refills | Status: AC
Start: 2022-08-07 — End: ?

## 2022-08-17 ENCOUNTER — Telehealth: Payer: Self-pay | Admitting: Orthopedic Surgery

## 2022-08-17 NOTE — Telephone Encounter (Signed)
Left message for patient to please return my call to discuss surgery that is scheduled for Friday 08/25/22.

## 2022-08-22 ENCOUNTER — Other Ambulatory Visit: Payer: Self-pay | Admitting: Family Medicine

## 2022-08-23 ENCOUNTER — Other Ambulatory Visit (HOSPITAL_BASED_OUTPATIENT_CLINIC_OR_DEPARTMENT_OTHER): Payer: Self-pay

## 2022-08-23 MED ORDER — SUMATRIPTAN SUCCINATE 50 MG PO TABS
ORAL_TABLET | ORAL | 0 refills | Status: DC
  Filled 2022-08-23: qty 9, 13d supply, fill #0

## 2022-08-24 ENCOUNTER — Other Ambulatory Visit: Payer: Self-pay

## 2022-08-24 ENCOUNTER — Other Ambulatory Visit: Payer: Self-pay | Admitting: Orthopedic Surgery

## 2022-08-24 ENCOUNTER — Encounter: Payer: Self-pay | Admitting: Internal Medicine

## 2022-08-24 ENCOUNTER — Other Ambulatory Visit (HOSPITAL_BASED_OUTPATIENT_CLINIC_OR_DEPARTMENT_OTHER): Payer: Self-pay

## 2022-08-24 ENCOUNTER — Encounter (HOSPITAL_COMMUNITY): Payer: Self-pay | Admitting: Orthopedic Surgery

## 2022-08-24 MED ORDER — APIXABAN 5 MG PO TABS
ORAL_TABLET | ORAL | 0 refills | Status: DC
  Filled 2022-08-24: qty 60, 30d supply, fill #0

## 2022-08-24 NOTE — Progress Notes (Signed)
PCP - Marikay Alar Cardiologist -   Rheumatologist- Sheliah Hatch PPM/ICD - Denies Device Orders - n/a Rep Notified - n/a  CPAP - No  GLP-1 -Tirzepatide Last dose August 10, 2022  Blood Thinner Instructions: denies Aspirin Instructions: n/a  ERAS Protcol - clear liquid until 0430  COVID TEST- n/a  Anesthesia review: no  Patient verbally denies any shortness of breath, fever, cough and chest pain during phone call   -------------  SDW INSTRUCTIONS given:  Your procedure is scheduled on August 16-2024.  Report to [facility] Main Entrance "A" at 0530 A.M., and check in at the Admitting office.  Call this number if you have problems the morning of surgery:  [phone]   Remember:  Do not eat after midnight the night before your surgery  You may drink clear liquids until 0430 the morning of your surgery.   Clear liquids allowed are: Water, Non-Citrus Juices (without pulp), Carbonated Beverages, Clear Tea, Black Coffee Only, and Gatorade    Take these medicines the morning of surgery with A SIP OF WATER  levothyroxine (SYNTHROID)   IF needed cyclobenzaprine (FLEXERIL)    tirzepatide (ZEPBOUND)    As of today, STOP taking any Aspirin (unless otherwise instructed by your surgeon) Aleve, Naproxen, Ibuprofen, Motrin, Advil, Goody's, BC's, all herbal medications, fish oil, and all vitamins.                      Do not wear jewelry, make up, or nail polish            Do not wear lotions, powders, perfumes/colognes, or deodorant.            Do not shave 48 hours prior to surgery.  Men may shave face and neck.            Do not bring valuables to the hospital.            [facility] is not responsible for any belongings or valuables.  Do NOT Smoke (Tobacco/Vaping) 24 hours prior to your procedure If you use a CPAP at night, you may bring all equipment for your overnight stay.   Contacts, glasses, dentures or bridgework may not be worn into surgery.      For  patients admitted to the hospital, discharge time will be determined by your treatment team.   Patients discharged the day of surgery will not be allowed to drive home, and someone needs to stay with them for 24 hours.    Special instructions:   [facility]- Preparing For Surgery  Before surgery, you can play an important role. Because skin is not sterile, your skin needs to be as free of germs as possible. You can reduce the number of germs on your skin by washing with CHG (chlorahexidine gluconate) Soap before surgery.  CHG is an antiseptic cleaner which kills germs and bonds with the skin to continue killing germs even after washing.    Oral Hygiene is also important to reduce your risk of infection.  Remember - BRUSH YOUR TEETH THE MORNING OF SURGERY WITH YOUR REGULAR TOOTHPASTE  Please do not use if you have an allergy to CHG or antibacterial soaps. If your skin becomes reddened/irritated stop using the CHG.  Do not shave (including legs and underarms) for at least 48 hours prior to first CHG shower. It is OK to shave your face.  Please follow these instructions carefully.   Shower the NIGHT BEFORE SURGERY and the MORNING OF SURGERY  with DIAL Soap.   Pat yourself dry with a CLEAN TOWEL.  Wear CLEAN PAJAMAS to bed the night before surgery  Place CLEAN SHEETS on your bed the night of your first shower and DO NOT SLEEP WITH PETS.   Day of Surgery: Please shower morning of surgery  Wear Clean/Comfortable clothing the morning of surgery Do not apply any deodorants/lotions.   Remember to brush your teeth WITH YOUR REGULAR TOOTHPASTE.   Questions were answered. Patient verbalized understanding of instructions.

## 2022-08-24 NOTE — Anesthesia Preprocedure Evaluation (Addendum)
Anesthesia Evaluation  Patient identified by MRN, date of birth, ID band Patient awake    Reviewed: Allergy & Precautions, NPO status , Patient's Chart, lab work & pertinent test results  History of Anesthesia Complications (+) history of anesthetic complications  Airway Mallampati: II  TM Distance: >3 FB Neck ROM: Full    Dental  (+) Dental Advisory Given, Missing   Pulmonary neg pulmonary ROS   Pulmonary exam normal breath sounds clear to auscultation       Cardiovascular + DVT  Normal cardiovascular exam Rhythm:Regular Rate:Normal     Neuro/Psych  Headaches PSYCHIATRIC DISORDERS Anxiety Depression       GI/Hepatic Neg liver ROS, PUD,,,  Endo/Other  Hypothyroidism    Renal/GU negative Renal ROS     Musculoskeletal  (+) Arthritis ,  Fibromyalgia -Impingement Right Ankle   Abdominal   Peds  Hematology  (+) Blood dyscrasia (Eliquis; Heterozygous factor V Leiden mutation)   Anesthesia Other Findings   Reproductive/Obstetrics                             Anesthesia Physical Anesthesia Plan  ASA: 2  Anesthesia Plan: General   Post-op Pain Management: Tylenol PO (pre-op)*, Toradol IV (intra-op)* and Regional block*   Induction: Intravenous  PONV Risk Score and Plan: 3 and Midazolam, Dexamethasone and Ondansetron  Airway Management Planned: LMA  Additional Equipment:   Intra-op Plan:   Post-operative Plan: Extubation in OR  Informed Consent: I have reviewed the patients History and Physical, chart, labs and discussed the procedure including the risks, benefits and alternatives for the proposed anesthesia with the patient or authorized representative who has indicated his/her understanding and acceptance.     Dental advisory given  Plan Discussed with: CRNA  Anesthesia Plan Comments:        Anesthesia Quick Evaluation

## 2022-08-25 ENCOUNTER — Encounter (HOSPITAL_COMMUNITY): Payer: Self-pay | Admitting: Orthopedic Surgery

## 2022-08-25 ENCOUNTER — Other Ambulatory Visit: Payer: Self-pay

## 2022-08-25 ENCOUNTER — Telehealth: Payer: Self-pay

## 2022-08-25 ENCOUNTER — Ambulatory Visit (HOSPITAL_COMMUNITY): Payer: Commercial Managed Care - HMO

## 2022-08-25 ENCOUNTER — Ambulatory Visit (HOSPITAL_COMMUNITY): Payer: Commercial Managed Care - HMO | Admitting: Anesthesiology

## 2022-08-25 ENCOUNTER — Other Ambulatory Visit (HOSPITAL_BASED_OUTPATIENT_CLINIC_OR_DEPARTMENT_OTHER): Payer: Self-pay

## 2022-08-25 ENCOUNTER — Ambulatory Visit (HOSPITAL_COMMUNITY)
Admission: RE | Admit: 2022-08-25 | Discharge: 2022-08-25 | Disposition: A | Discharge status: Home / Self Care | Payer: Commercial Managed Care - HMO | Attending: Orthopedic Surgery | Admitting: Orthopedic Surgery

## 2022-08-25 ENCOUNTER — Encounter (HOSPITAL_COMMUNITY)
Admission: RE | Disposition: A | Discharge status: Home / Self Care | Payer: Self-pay | Source: Home / Self Care | Attending: Orthopedic Surgery

## 2022-08-25 DIAGNOSIS — E039 Hypothyroidism, unspecified: Secondary | ICD-10-CM | POA: Diagnosis not present

## 2022-08-25 DIAGNOSIS — M19071 Primary osteoarthritis, right ankle and foot: Secondary | ICD-10-CM

## 2022-08-25 DIAGNOSIS — Z7901 Long term (current) use of anticoagulants: Secondary | ICD-10-CM | POA: Diagnosis not present

## 2022-08-25 DIAGNOSIS — Z7722 Contact with and (suspected) exposure to environmental tobacco smoke (acute) (chronic): Secondary | ICD-10-CM | POA: Diagnosis not present

## 2022-08-25 DIAGNOSIS — M25871 Other specified joint disorders, right ankle and foot: Secondary | ICD-10-CM

## 2022-08-25 DIAGNOSIS — D6851 Activated protein C resistance: Secondary | ICD-10-CM | POA: Insufficient documentation

## 2022-08-25 DIAGNOSIS — Z86718 Personal history of other venous thrombosis and embolism: Secondary | ICD-10-CM | POA: Diagnosis not present

## 2022-08-25 DIAGNOSIS — Z8711 Personal history of peptic ulcer disease: Secondary | ICD-10-CM | POA: Insufficient documentation

## 2022-08-25 DIAGNOSIS — M12571 Traumatic arthropathy, right ankle and foot: Secondary | ICD-10-CM

## 2022-08-25 HISTORY — PX: ANKLE ARTHROSCOPY: SHX545

## 2022-08-25 SURGERY — ARTHROSCOPY, ANKLE
Anesthesia: General | Site: Ankle | Laterality: Right

## 2022-08-25 MED ORDER — ORAL CARE MOUTH RINSE: 15.0000 mL | Freq: Once | OROMUCOSAL | Status: AC

## 2022-08-25 MED ORDER — MIDAZOLAM HCL 2 MG/2ML IJ SOLN
INTRAMUSCULAR | Status: DC | PRN
  Administered 2022-08-25: 2 mg via INTRAVENOUS

## 2022-08-25 MED ORDER — ACETAMINOPHEN 500 MG PO TABS
1000.0000 mg | ORAL_TABLET | Freq: Once | ORAL | Status: AC
  Administered 2022-08-25: 1000 mg via ORAL
  Filled 2022-08-25: qty 2

## 2022-08-25 MED ORDER — CHLORHEXIDINE GLUCONATE 0.12 % MT SOLN
OROMUCOSAL | Status: AC
  Administered 2022-08-25: 15 mL via OROMUCOSAL
  Filled 2022-08-25: qty 15

## 2022-08-25 MED ORDER — MIDAZOLAM HCL 2 MG/2ML IJ SOLN
INTRAMUSCULAR | Status: AC
  Filled 2022-08-25: qty 2

## 2022-08-25 MED ORDER — 0.9 % SODIUM CHLORIDE (POUR BTL) OPTIME
TOPICAL | Status: DC | PRN
  Administered 2022-08-25: 1000 mL

## 2022-08-25 MED ORDER — CEFAZOLIN SODIUM-DEXTROSE 2-4 GM/100ML-% IV SOLN
INTRAVENOUS | Status: AC
  Filled 2022-08-25: qty 100

## 2022-08-25 MED ORDER — ONDANSETRON HCL 4 MG/2ML IJ SOLN
INTRAMUSCULAR | Status: DC | PRN
Start: 2022-08-25 — End: 2022-08-25
  Administered 2022-08-25: 4 mg via INTRAVENOUS

## 2022-08-25 MED ORDER — PROPOFOL 10 MG/ML IV BOLUS
INTRAVENOUS | Status: AC
  Filled 2022-08-25: qty 20

## 2022-08-25 MED ORDER — FENTANYL CITRATE (PF) 250 MCG/5ML IJ SOLN
INTRAMUSCULAR | Status: AC
  Filled 2022-08-25: qty 5

## 2022-08-25 MED ORDER — SODIUM CHLORIDE 0.9 % IR SOLN
Status: DC | PRN
  Administered 2022-08-25: 3000 mL

## 2022-08-25 MED ORDER — PHENYLEPHRINE 80 MCG/ML (10ML) SYRINGE FOR IV PUSH (FOR BLOOD PRESSURE SUPPORT)
PREFILLED_SYRINGE | INTRAVENOUS | Status: DC | PRN
  Administered 2022-08-25: 120 ug via INTRAVENOUS
  Administered 2022-08-25: 160 ug via INTRAVENOUS

## 2022-08-25 MED ORDER — PHENYLEPHRINE 80 MCG/ML (10ML) SYRINGE FOR IV PUSH (FOR BLOOD PRESSURE SUPPORT)
PREFILLED_SYRINGE | INTRAVENOUS | Status: AC
  Filled 2022-08-25: qty 10

## 2022-08-25 MED ORDER — OXYCODONE-ACETAMINOPHEN 5-325 MG PO TABS
1.0000 | ORAL_TABLET | ORAL | 0 refills | Status: DC | PRN
  Filled 2022-08-25: qty 30, 5d supply, fill #0

## 2022-08-25 MED ORDER — CHLORHEXIDINE GLUCONATE 0.12 % MT SOLN: 15.0000 mL | Freq: Once | OROMUCOSAL | Status: AC

## 2022-08-25 MED ORDER — LACTATED RINGERS IV SOLN: INTRAVENOUS | Status: DC

## 2022-08-25 MED ORDER — PROPOFOL 10 MG/ML IV BOLUS
INTRAVENOUS | Status: DC | PRN
  Administered 2022-08-25: 170 mg via INTRAVENOUS

## 2022-08-25 MED ORDER — DEXAMETHASONE SODIUM PHOSPHATE 10 MG/ML IJ SOLN
INTRAMUSCULAR | Status: AC
  Filled 2022-08-25: qty 1

## 2022-08-25 MED ORDER — DEXAMETHASONE SODIUM PHOSPHATE 10 MG/ML IJ SOLN
INTRAMUSCULAR | Status: DC | PRN
  Administered 2022-08-25: 4 mg via INTRAVENOUS

## 2022-08-25 MED ORDER — ONDANSETRON HCL 4 MG/2ML IJ SOLN
INTRAMUSCULAR | Status: AC
  Filled 2022-08-25: qty 2

## 2022-08-25 MED ORDER — FENTANYL CITRATE (PF) 250 MCG/5ML IJ SOLN
INTRAMUSCULAR | Status: DC | PRN
  Administered 2022-08-25: 25 ug via INTRAVENOUS

## 2022-08-25 MED ORDER — LIDOCAINE 2% (20 MG/ML) 5 ML SYRINGE
INTRAMUSCULAR | Status: DC | PRN
  Administered 2022-08-25: 80 mg via INTRAVENOUS

## 2022-08-25 MED ORDER — LIDOCAINE 2% (20 MG/ML) 5 ML SYRINGE
INTRAMUSCULAR | Status: AC
  Filled 2022-08-25: qty 5

## 2022-08-25 MED ORDER — CEFAZOLIN SODIUM-DEXTROSE 2-4 GM/100ML-% IV SOLN
2.0000 g | INTRAVENOUS | Status: AC
  Administered 2022-08-25: 2 g via INTRAVENOUS

## 2022-08-25 SURGICAL SUPPLY — 47 items
BAG COUNTER SPONGE SURGICOUNT (BAG) ×1 IMPLANT
BAG SPNG CNTER NS LX DISP (BAG) ×1
BLADE CUDA 5.5 (BLADE) IMPLANT
BLADE EXCALIBUR 4.0X13 (MISCELLANEOUS) IMPLANT
BNDG CMPR 5X6 CHSV STRCH STRL (GAUZE/BANDAGES/DRESSINGS)
BNDG COHESIVE 6X5 TAN NS LF (GAUZE/BANDAGES/DRESSINGS) IMPLANT
BNDG COHESIVE 6X5 TAN ST LF (GAUZE/BANDAGES/DRESSINGS) ×1 IMPLANT
BNDG GAUZE DERMACEA FLUFF 4 (GAUZE/BANDAGES/DRESSINGS) IMPLANT
BNDG GZE DERMACEA 4 6PLY (GAUZE/BANDAGES/DRESSINGS) ×1
BUR OVAL 4.0 (BURR) IMPLANT
COVER SURGICAL LIGHT HANDLE (MISCELLANEOUS) ×2 IMPLANT
CUFF TOURN SGL QUICK 34 (TOURNIQUET CUFF)
CUFF TOURN SGL QUICK 42 (TOURNIQUET CUFF) IMPLANT
CUFF TRNQT CYL 34X4.125X (TOURNIQUET CUFF) IMPLANT
DRAPE ARTHROSCOPY W/POUCH 114 (DRAPES) ×1 IMPLANT
DRAPE OEC MINIVIEW 54X84 (DRAPES) IMPLANT
DRAPE U-SHAPE 47X51 STRL (DRAPES) ×1 IMPLANT
DRSG ADAPTIC 3X8 NADH LF (GAUZE/BANDAGES/DRESSINGS) IMPLANT
DRSG EMULSION OIL 3X3 NADH (GAUZE/BANDAGES/DRESSINGS) ×1 IMPLANT
DURAPREP 26ML APPLICATOR (WOUND CARE) ×1 IMPLANT
GAUZE PAD ABD 8X10 STRL (GAUZE/BANDAGES/DRESSINGS) ×1 IMPLANT
GAUZE SPONGE 4X4 12PLY STRL (GAUZE/BANDAGES/DRESSINGS) ×1 IMPLANT
GLOVE BIOGEL PI IND STRL 9 (GLOVE) ×1 IMPLANT
GLOVE SURG ORTHO 9.0 STRL STRW (GLOVE) ×1 IMPLANT
GOWN STRL REUS W/ TWL XL LVL3 (GOWN DISPOSABLE) ×3 IMPLANT
GOWN STRL REUS W/TWL XL LVL3 (GOWN DISPOSABLE) ×3
KIT BASIN OR (CUSTOM PROCEDURE TRAY) ×1 IMPLANT
KIT TURNOVER KIT B (KITS) ×1 IMPLANT
MANIFOLD NEPTUNE II (INSTRUMENTS) ×1 IMPLANT
NDL 18GX1X1/2 (RX/OR ONLY) (NEEDLE) ×1 IMPLANT
NEEDLE 18GX1X1/2 (RX/OR ONLY) (NEEDLE) IMPLANT
PACK ARTHROSCOPY DSU (CUSTOM PROCEDURE TRAY) ×1 IMPLANT
PAD ABD 8X10 STRL (GAUZE/BANDAGES/DRESSINGS) IMPLANT
PAD ARMBOARD 7.5X6 YLW CONV (MISCELLANEOUS) ×2 IMPLANT
PADDING CAST COTTON 6X4 STRL (CAST SUPPLIES) ×1 IMPLANT
PORT APPOLLO RF 90DEGREE MULTI (SURGICAL WAND) IMPLANT
SPONGE T-LAP 4X18 ~~LOC~~+RFID (SPONGE) ×1 IMPLANT
SUT ETHILON 4 0 PS 2 18 (SUTURE) ×1 IMPLANT
SUT MNCRL AB 3-0 PS2 18 (SUTURE) IMPLANT
SUT VIC AB 2-0 CT1 27 (SUTURE)
SUT VIC AB 2-0 CT1 TAPERPNT 27 (SUTURE) IMPLANT
SYR 20ML LL LF (SYRINGE) ×1 IMPLANT
TAPE STRIPS DRAPE STRL (GAUZE/BANDAGES/DRESSINGS) IMPLANT
TOWEL GREEN STERILE (TOWEL DISPOSABLE) ×1 IMPLANT
TOWEL GREEN STERILE FF (TOWEL DISPOSABLE) ×1 IMPLANT
TUBING ARTHROSCOPY IRRIG 16FT (MISCELLANEOUS) ×1 IMPLANT
WATER STERILE IRR 1000ML POUR (IV SOLUTION) ×1 IMPLANT

## 2022-08-25 NOTE — H&P (Signed)
Whitney Hill is an 52 y.o. female.   Chief Complaint: Impingement pain right ankle. HPI: Patient is a 52 year old woman with chronic anterior right ankle pain.  Patient has a history of syndesmotic injury status post previous internal fixation.  The internal fixation has been removed.  Patient is also status post bone stimulator for the ankle and there is a retained wire from the previous bone stimulator. Past Medical History:  Diagnosis Date   Arthritis    OA   Complication of anesthesia    "ITCHING TO FACE, BENADRYL HELP BUT DROPS BLOOD PRESSURE"   DVT (deep venous thrombosis) (HCC)    following first knee surgery in 2011   Gastric ulcer    Hashimoto's disease 09/18/2017   Headache    Heterozygous factor V Leiden mutation (HCC)    Hives    chronic   Hypothyroid    Iron deficiency anemia    Migraine     Past Surgical History:  Procedure Laterality Date   ANKLE FRACTURE SURGERY     1994, 1996   CHOLECYSTECTOMY     1998   COLONOSCOPY WITH PROPOFOL N/A 06/14/2018   Procedure: COLONOSCOPY WITH BIOPSY;  Surgeon: Midge Minium, MD;  Location: [facility];  Service: Endoscopy;  Laterality: N/A;   DILATION AND CURETTAGE OF UTERUS     1994, 2001   ESOPHAGOGASTRODUODENOSCOPY (EGD) WITH PROPOFOL N/A 06/14/2018   Procedure: ESOPHAGOGASTRODUODENOSCOPY (EGD) WITH PROPOFOL;  Surgeon: Midge Minium, MD;  Location: [facility];  Service: Endoscopy;  Laterality: N/A;   FOREIGN BODY REMOVAL N/A 06/14/2018   Procedure: FOREIGN BODY REMOVAL;  Surgeon: Midge Minium, MD;  Location: [facility];  Service: Endoscopy;  Laterality: N/A;  Staple Removal from Stomach   GASTRIC BYPASS     2012, revision 2013   KNEE ARTHROSCOPY     2011, 2013, 2015   KNEE SURGERY Right 09/21/2021   POLYPECTOMY N/A 06/14/2018   Procedure: POLYPECTOMY;  Surgeon: Midge Minium, MD;  Location: [facility];  Service: Endoscopy;  Laterality: N/A;   TOOTH EXTRACTION     2010   TOTAL KNEE  ARTHROPLASTY Left 05/25/2015   Procedure: LEFT TOTAL KNEE ARTHROPLASTY;  Surgeon: Sheral Apley, MD;  Location: [facility] OR;  Service: Orthopedics;  Laterality: Left;   TUBAL LIGATION     2004    Family History  Problem Relation Age of Onset   Stroke Mother    Heart attack Mother    AAA (abdominal aortic aneurysm) Mother    Kidney cancer Mother    Atrial fibrillation Mother    Diabetes Father    Dementia Father    Breast cancer Sister 62   Leukemia Sister    Breast cancer Maternal Grandmother        late 50's   Breast cancer Maternal Aunt 44       BRACA + dx twice age 57 and 85   Arthritis Other        parent   Hyperlipidemia Other        parent   Stroke Other        parent   Hypertension Other        parent   Kidney disease Other        parent, grandparent   Diabetes Other        parent, grandparent   Renal cancer Other        parent   Social History:  reports that she has never smoked. She has been exposed  to tobacco smoke. She has never used smokeless tobacco. She reports that she does not drink alcohol and does not use drugs.  Allergies:  Allergies  Allergen Reactions   Paxil [Paroxetine Hcl] Anaphylaxis    Throat swelled shut   Penicillins Hives    Had cefazolin 2017, 2024    Medications Prior to Admission  Medication Sig Dispense Refill   cyanocobalamin (VITAMIN B12) 1000 MCG/ML injection Inject 1 mL (1000 mcg total) into the muscle once weekly for 4 weeks and then once monthly 3 mL 3   cyclobenzaprine (FLEXERIL) 5 MG tablet Take 1 tablet (5 mg total) by mouth 3 (three) times daily as needed (muscle pain). 30 tablet 1   diclofenac sodium (VOLTAREN) 1 % GEL Apply 2 g topically 4 (four) times daily as needed (pain.).     EPINEPHrine 0.3 mg/0.3 mL IJ SOAJ injection Inject 0.3 mg into the muscle as needed for anaphylaxis. 2 each 0   levothyroxine (SYNTHROID) 150 MCG tablet Take 1 tablet (150 mcg total) by mouth daily before breakfast. 90 tablet 0    multivitamin-iron-minerals-folic acid (CENTRUM) chewable tablet Chew 1 tablet by mouth daily.     SUMAtriptan (IMITREX) 50 MG tablet TAKE 1 TABLET BY MOUTH EVERY 2 HOURS AS NEEDED FOR MIGRAINE. MAY REPEAT IN 2 HOURS IF HEADACHE PERSISTS OR RECURS. DO NOT TAKE MORE THAN 2 DOSES IN 24 HOURS. 9 tablet 0   tirzepatide (ZEPBOUND) 7.5 MG/0.5ML Pen Inject 7.5 mg into the skin once a week. (Patient taking differently: Inject 7.5 mg into the skin every Thursday.) 2 mL 2   Vitamin D, Ergocalciferol, (DRISDOL) 1.25 MG (50000 UNIT) CAPS capsule Take 1 capsule (50,000 Units total) by mouth every 7 (seven) days. (Patient taking differently: Take 50,000 Units by mouth every Thursday.) 12 capsule 1   apixaban (ELIQUIS) 5 MG TABS tablet Take 2 tablets (10mg ) twice daily for 7 days, then 1 tablet (5mg ) twice daily 60 tablet 0   Folic Acid 5 MG CAPS Take 5 mg by mouth daily.     Syringe/Needle, Disp, (SYRINGE 3CC/25GX1") 25G X 1" 3 ML MISC Use with b12 injections 50 each 0    No results found for this or any previous visit (from the past 48 hour(s)). No results found.  Review of Systems  All other systems reviewed and are negative.   Blood pressure 136/84, pulse 70, temperature 98 F (36.7 C), temperature source Oral, resp. rate 18, height 5\' 5"  (1.651 m), weight 97.5 kg, last menstrual period 08/24/2018, SpO2 99%. Physical Exam  Examination there is no redness no cellulitis no signs of infection.  Patient has pain to palpation anteriorly over the ankle there is no pain with range of motion of the subtalar joint.  Dorsiflexion of the ankle reproduces pain.  Radiographs show previous syndesmotic injury with calcification removal of internal fixation and retained wire from a bone stimulator. Assessment/Plan Assessment: Traumatic arthritis right ankle status post internal fixation for syndesmotic injury status post bone stimulator for delayed union.  Plan: Will plan for arthroscopic debridement of right ankle.   Risks and benefits were discussed including infection persistent pain need for additional surgery.  Patient states she understands wished to proceed at this time.  Nadara Mustard, MD 08/25/2022, 6:44 AM

## 2022-08-25 NOTE — Telephone Encounter (Signed)
Is this the factor 5 pt? Please see message below

## 2022-08-25 NOTE — Transfer of Care (Signed)
Immediate Anesthesia Transfer of Care Note  Patient: Whitney Hill  Procedure(s) Performed: RIGHT ANKLE ARTHROSCOPY (Right: Ankle)  Patient Location: PACU  Anesthesia Type:General and Regional  Level of Consciousness: awake, drowsy, patient cooperative, and responds to stimulation  Airway & Oxygen Therapy: Patient Spontanous Breathing and Patient connected to nasal cannula oxygen  Post-op Assessment: Report given to RN and Post -op Vital signs reviewed and stable  Post vital signs: Reviewed and stable  Last Vitals:  Vitals Value Taken Time  BP    Temp    Pulse 76 08/25/22 0813  Resp    SpO2 98 % 08/25/22 0813  Vitals shown include unfiled device data.  Last Pain:  Vitals:   08/25/22 0551  TempSrc:   PainSc: 3          Complications: No notable events documented.

## 2022-08-25 NOTE — Anesthesia Procedure Notes (Signed)
Anesthesia Regional Block: Popliteal block   Pre-Anesthetic Checklist: , timeout performed,  Correct Patient, Correct Site, Correct Laterality,  Correct Procedure, Correct Position, site marked,  Risks and benefits discussed,  Surgical consent,  Pre-op evaluation,  At surgeon's request and post-op pain management  Laterality: Right  Prep: chloraprep       Needles:  Injection technique: Single-shot  Needle Type: Echogenic Needle     Needle Length: 9cm  Needle Gauge: 21     Additional Needles:   Procedures:,,,, ultrasound used (permanent image in chart),,    Narrative:  Start time: 08/25/2022 7:15 AM End time: 08/25/2022 7:20 AM Injection made incrementally with aspirations every 5 mL.  Performed by: Personally  Anesthesiologist: Collene Schlichter, MD  Additional Notes: No pain on injection. No increased resistance to injection. Injection made in 5cc increments.  Good needle visualization.  Patient tolerated procedure well.

## 2022-08-25 NOTE — Telephone Encounter (Signed)
Patient called wanting to know when to start blood thinners.  Patient had right ankle surgery today.  CB# [phone].  Please advise.  Thank you.

## 2022-08-25 NOTE — Anesthesia Procedure Notes (Signed)
Anesthesia Regional Block: Adductor canal block   Pre-Anesthetic Checklist: , timeout performed,  Correct Patient, Correct Site, Correct Laterality,  Correct Procedure, Correct Position, site marked,  Risks and benefits discussed,  Surgical consent,  Pre-op evaluation,  At surgeon's request and post-op pain management  Laterality: Right  Prep: chloraprep       Needles:  Injection technique: Single-shot  Needle Type: Echogenic Needle     Needle Length: 9cm  Needle Gauge: 21     Additional Needles:   Procedures:,,,, ultrasound used (permanent image in chart),,    Narrative:  Start time: 08/25/2022 7:20 AM End time: 08/25/2022 7:25 AM Injection made incrementally with aspirations every 5 mL.  Performed by: Personally  Anesthesiologist: Collene Schlichter, MD  Additional Notes: No pain on injection. No increased resistance to injection. Injection made in 5cc increments.  Good needle visualization.  Patient tolerated procedure well.

## 2022-08-25 NOTE — Telephone Encounter (Signed)
-----   Message from Marikay Alar sent at 08/25/2022 11:44 AM EDT ----- We can recheck this at her next visit.

## 2022-08-25 NOTE — Anesthesia Procedure Notes (Signed)
Procedure Name: LMA Insertion Date/Time: 08/25/2022 7:29 AM  Performed by: Ayesha Rumpf, CRNAPre-anesthesia Checklist: Patient identified, Emergency Drugs available, Suction available and Patient being monitored Patient Re-evaluated:Patient Re-evaluated prior to induction Oxygen Delivery Method: Circle System Utilized Preoxygenation: Pre-oxygenation with 100% oxygen Induction Type: IV induction Ventilation: Mask ventilation without difficulty LMA: LMA inserted LMA Size: 4.0 Number of attempts: 1 Airway Equipment and Method: Bite block Placement Confirmation: positive ETCO2 Tube secured with: Tape Dental Injury: Teeth and Oropharynx as per pre-operative assessment

## 2022-08-25 NOTE — Telephone Encounter (Signed)
I sw pt and advised of message below.

## 2022-08-25 NOTE — Op Note (Signed)
08/25/2022  8:05 AM  PATIENT:  Whitney Hill    PRE-OPERATIVE DIAGNOSIS: Traumatic arthritis with impingement Right Ankle  POST-OPERATIVE DIAGNOSIS:  Same  PROCEDURE:  RIGHT ANKLE ARTHROSCOPY with debridement and decompression.  SURGEON:  Nadara Mustard, MD  PHYSICIAN ASSISTANT:None ANESTHESIA:   General  PREOPERATIVE INDICATIONS:  Whitney Hill is a  52 y.o. female with a diagnosis of Impingement Right Ankle who failed conservative measures and elected for surgical management.    The risks benefits and alternatives were discussed with the patient preoperatively including but not limited to the risks of infection, bleeding, nerve injury, cardiopulmonary complications, the need for revision surgery, among others, and the patient was willing to proceed.  OPERATIVE IMPLANTS:   * No implants in log *  @ENCIMAGES @  OPERATIVE FINDINGS: Patient had extensive traumatic arthritis of the right ankle with full-thickness articular defect of three quarters of the joint space.  OPERATIVE PROCEDURE: Patient brought the operating room and underwent a general anesthetic.  After close anesthesia obtained patient's right lower extremity was prepped using DuraPrep draped into a sterile field a timeout was called.  An 18-gauge needle was used to localize anterior medial portal.  The skin was incised blunt dissection was carried down to the capsule and a blunt trocar was used and inserted into the joint.  With visualization from within the joint the lateral portal was established with an 18-gauge needle skin was incised blunt dissection was carried down to the joint and a blunt trocar was used inserted into the joint.  There was extensive synovitis and articular cartilage defect with loose bodies.  Using a shaver the articular cartilage defects were debrided back to bleeding viable subchondral bone of both the distal tibia and talar dome.  The gutters were cleansed.  There was extensive articular  cartilage defect.  There was good petechial bleeding within the bone.  Electrocautery was used for hemostasis.  Then survey was performed of all compartments and all loose bodies were removed.  This was removed portals closed with 3-0 nylon sterile dressings applied patient was extubated taken the PACU in stable condition.   DISCHARGE PLANNING:  Antibiotic duration: Preoperative antibiotics  Weightbearing: Weightbearing as tolerated  Pain medication: Prescription of Percocet  Dressing care/ Wound VAC: Dry dressing removed in 3 days  Ambulatory devices: Crutches  Discharge to: Home.  Follow-up: In the office 1 week post operative.

## 2022-08-25 NOTE — Progress Notes (Signed)
Orthopedic Tech Progress Note Patient Details:  Whitney Hill [birth year] [id number] Dropped off Post op shoe per order from the nurse.  Ortho Devices Type of Ortho Device: Darco shoe Ortho Device/Splint Location: RLE Ortho Device/Splint Interventions: Ordered   Post Interventions Instructions Provided: Adjustment of device, Care of device  Blase Mess 08/25/2022, 8:38 AM

## 2022-08-25 NOTE — Telephone Encounter (Signed)
See messages below.  

## 2022-08-26 NOTE — Anesthesia Postprocedure Evaluation (Signed)
Anesthesia Post Note  Patient: Whitney Hill  Procedure(s) Performed: RIGHT ANKLE ARTHROSCOPY (Right: Ankle)     Patient location during evaluation: PACU Anesthesia Type: General Level of consciousness: awake and alert Pain management: pain level controlled Vital Signs Assessment: post-procedure vital signs reviewed and stable Respiratory status: spontaneous breathing, nonlabored ventilation, respiratory function stable and patient connected to nasal cannula oxygen Cardiovascular status: blood pressure returned to baseline and stable Postop Assessment: no apparent nausea or vomiting Anesthetic complications: no   No notable events documented.  Last Vitals:  Vitals:   08/25/22 0830 08/25/22 0845  BP: 112/75 123/70  Pulse: (!) 59 (!) 45  Resp: 13 11  Temp:  36.6 C  SpO2: 100% 100%    Last Pain:  Vitals:   08/25/22 0830  TempSrc:   PainSc: 0-No pain                 Collene Schlichter

## 2022-08-27 ENCOUNTER — Encounter (HOSPITAL_COMMUNITY): Payer: Self-pay | Admitting: Orthopedic Surgery

## 2022-08-29 ENCOUNTER — Encounter (HOSPITAL_COMMUNITY): Payer: Self-pay | Admitting: Orthopedic Surgery

## 2022-08-30 ENCOUNTER — Other Ambulatory Visit (HOSPITAL_BASED_OUTPATIENT_CLINIC_OR_DEPARTMENT_OTHER): Payer: Self-pay

## 2022-08-30 ENCOUNTER — Other Ambulatory Visit: Payer: Self-pay | Admitting: Family Medicine

## 2022-08-30 ENCOUNTER — Encounter: Payer: Commercial Managed Care - HMO | Admitting: Family

## 2022-08-30 MED ORDER — SUMATRIPTAN SUCCINATE 50 MG PO TABS
ORAL_TABLET | ORAL | 0 refills | Status: DC
  Filled 2022-08-30: qty 9, fill #0
  Filled 2022-09-04: qty 9, 15d supply, fill #0
  Filled 2022-09-16: qty 9, 13d supply, fill #0

## 2022-08-31 ENCOUNTER — Telehealth: Payer: Self-pay

## 2022-08-31 NOTE — Telephone Encounter (Signed)
Patient called stating she missed her appointment on 08/30/2022 to have her stitches removed.  Would like to R/S. Had right ankle surgery on 08/25/2022. Cb# [phone].  Please advise.

## 2022-09-01 ENCOUNTER — Telehealth: Payer: Self-pay | Admitting: Family

## 2022-09-01 NOTE — Telephone Encounter (Signed)
Correction PA Denny Peon has availablity 8/4

## 2022-09-01 NOTE — Telephone Encounter (Signed)
Called pt 1X and left vm that PA Erin said it's ok to make appt with PA erin for 1st available date. Pt missed her post op and need reschedule. Sept 6 with Denny Peon is ok for date of appt

## 2022-09-03 ENCOUNTER — Encounter: Payer: Self-pay | Admitting: Family Medicine

## 2022-09-04 ENCOUNTER — Other Ambulatory Visit: Payer: Self-pay

## 2022-09-04 ENCOUNTER — Ambulatory Visit
Admission: EM | Admit: 2022-09-04 | Discharge: 2022-09-04 | Disposition: A | Discharge status: Home / Self Care | Payer: Commercial Managed Care - HMO | Attending: Internal Medicine | Admitting: Internal Medicine

## 2022-09-04 ENCOUNTER — Other Ambulatory Visit (HOSPITAL_BASED_OUTPATIENT_CLINIC_OR_DEPARTMENT_OTHER): Payer: Self-pay

## 2022-09-04 DIAGNOSIS — H6690 Otitis media, unspecified, unspecified ear: Secondary | ICD-10-CM | POA: Diagnosis not present

## 2022-09-04 DIAGNOSIS — J309 Allergic rhinitis, unspecified: Secondary | ICD-10-CM

## 2022-09-04 MED ORDER — CEFDINIR 300 MG PO CAPS
300.0000 mg | ORAL_CAPSULE | Freq: Two times a day (BID) | ORAL | 0 refills | Status: DC
  Filled 2022-09-04: qty 20, 10d supply, fill #0

## 2022-09-04 MED ORDER — PSEUDOEPHEDRINE HCL 30 MG PO TABS
30.0000 mg | ORAL_TABLET | Freq: Three times a day (TID) | ORAL | 0 refills | Status: DC | PRN
Start: 2022-09-04 — End: 2022-11-01
  Filled 2022-09-04: qty 30, 10d supply, fill #0

## 2022-09-04 MED ORDER — ZEPBOUND 10 MG/0.5ML ~~LOC~~ SOAJ: 10.0000 mg | SUBCUTANEOUS | 1 refills | Status: DC

## 2022-09-04 MED ORDER — CETIRIZINE HCL 10 MG PO TABS
10.0000 mg | ORAL_TABLET | Freq: Every day | ORAL | 0 refills | Status: DC
Start: 2022-09-04 — End: 2022-11-01
  Filled 2022-09-04: qty 30, 30d supply, fill #0

## 2022-09-04 NOTE — Discharge Instructions (Addendum)
Please go ahead and start cefdinir for middle ear infection of the left side.  Use Zyrtec and pseudoephedrine for drainage of the eustachian tube bothering your ear and also of the throat.  Use Tylenol for aches and pains, fevers.

## 2022-09-04 NOTE — Telephone Encounter (Signed)
Medication has been pended

## 2022-09-04 NOTE — ED Triage Notes (Signed)
Pt c/o pain to left ear and left side of neck day 2-states she was intubated for surgery 8/16-NAD-steady gait

## 2022-09-04 NOTE — ED Provider Notes (Signed)
[facility]  Note:  This document was prepared using Conservation officer, historic buildings and may include unintentional dictation errors.  MRN: [id number] DOB: 1970-02-18  Subjective:   BAYLI LAUMANN is a 52 y.o. female presenting for 2-day history of acute onset moderate left ear pain, left-sided throat pain worse with swallowing.  No fever, drainage, runny or stuffy nose, cough.  Patient had a left ankle arthroscopy 08/25/2022 but did not have symptoms thereafter.  No current facility-administered medications for this encounter.  Current Outpatient Medications:    apixaban (ELIQUIS) 5 MG TABS tablet, Take 2 tablets (10mg ) twice daily for 7 days, then 1 tablet (5mg ) twice daily, Disp: 60 tablet, Rfl: 0   cyanocobalamin (VITAMIN B12) 1000 MCG/ML injection, Inject 1 mL (1000 mcg total) into the muscle once weekly for 4 weeks and then once monthly, Disp: 3 mL, Rfl: 3   cyclobenzaprine (FLEXERIL) 5 MG tablet, Take 1 tablet (5 mg total) by mouth 3 (three) times daily as needed (muscle pain)., Disp: 30 tablet, Rfl: 1   diclofenac sodium (VOLTAREN) 1 % GEL, Apply 2 g topically 4 (four) times daily as needed (pain.)., Disp: , Rfl:    EPINEPHrine 0.3 mg/0.3 mL IJ SOAJ injection, Inject 0.3 mg into the muscle as needed for anaphylaxis., Disp: 2 each, Rfl: 0   Folic Acid 5 MG CAPS, Take 5 mg by mouth daily., Disp: , Rfl:    levothyroxine (SYNTHROID) 150 MCG tablet, Take 1 tablet (150 mcg total) by mouth daily before breakfast., Disp: 90 tablet, Rfl: 0   multivitamin-iron-minerals-folic acid (CENTRUM) chewable tablet, Chew 1 tablet by mouth daily., Disp: , Rfl:    oxyCODONE-acetaminophen (PERCOCET/ROXICET) 5-325 MG tablet, Take 1 tablet by mouth every 4 (four) hours as needed., Disp: 30 tablet, Rfl: 0   SUMAtriptan (IMITREX) 50 MG tablet, TAKE 1 TABLET BY MOUTH EVERY 2 HOURS AS NEEDED FOR MIGRAINE. MAY REPEAT IN 2 HOURS IF HEADACHE PERSISTS OR RECURS. DO NOT TAKE MORE THAN 2  DOSES IN 24 HOURS., Disp: 9 tablet, Rfl: 0   Syringe/Needle, Disp, (SYRINGE 3CC/25GX1") 25G X 1" 3 ML MISC, Use with b12 injections, Disp: 50 each, Rfl: 0   tirzepatide (ZEPBOUND) 10 MG/0.5ML Pen, Inject 10 mg into the skin once a week., Disp: 2 mL, Rfl: 1   tirzepatide (ZEPBOUND) 7.5 MG/0.5ML Pen, Inject 7.5 mg into the skin once a week. (Patient taking differently: Inject 7.5 mg into the skin every Thursday.), Disp: 2 mL, Rfl: 2   Vitamin D, Ergocalciferol, (DRISDOL) 1.25 MG (50000 UNIT) CAPS capsule, Take 1 capsule (50,000 Units total) by mouth every 7 (seven) days. (Patient taking differently: Take 50,000 Units by mouth every Thursday.), Disp: 12 capsule, Rfl: 1   Allergies  Allergen Reactions   Paxil [Paroxetine Hcl] Anaphylaxis    Throat swelled shut   Penicillins Hives    Had cefazolin 2017, 2024    Past Medical History:  Diagnosis Date   Arthritis    OA   Complication of anesthesia    "ITCHING TO FACE, BENADRYL HELP BUT DROPS BLOOD PRESSURE"   DVT (deep venous thrombosis) (HCC)    following first knee surgery in 2011   Gastric ulcer    Hashimoto's disease 09/18/2017   Headache    Heterozygous factor V Leiden mutation (HCC)    Hives    chronic   Hypothyroid    Iron deficiency anemia    Migraine      Past Surgical History:  Procedure Laterality Date  ANKLE ARTHROSCOPY Right 08/25/2022   Procedure: RIGHT ANKLE ARTHROSCOPY;  Surgeon: Nadara Mustard, MD;  [facility];  Service: Orthopedics;  Laterality: Right;   ANKLE FRACTURE SURGERY     1994, 1996   CHOLECYSTECTOMY     1998   COLONOSCOPY WITH PROPOFOL N/A 06/14/2018   Procedure: COLONOSCOPY WITH BIOPSY;  Surgeon: Midge Minium, MD;  Location: [facility];  Service: Endoscopy;  Laterality: N/A;   DILATION AND CURETTAGE OF UTERUS     1994, 2001   ESOPHAGOGASTRODUODENOSCOPY (EGD) WITH PROPOFOL N/A 06/14/2018   Procedure: ESOPHAGOGASTRODUODENOSCOPY (EGD) WITH PROPOFOL;  Surgeon: Midge Minium, MD;  Location:  [facility];  Service: Endoscopy;  Laterality: N/A;   FOREIGN BODY REMOVAL N/A 06/14/2018   Procedure: FOREIGN BODY REMOVAL;  Surgeon: Midge Minium, MD;  Location: [facility];  Service: Endoscopy;  Laterality: N/A;  Staple Removal from Stomach   GASTRIC BYPASS     2012, revision 2013   KNEE ARTHROSCOPY     2011, 2013, 2015   KNEE SURGERY Right 09/21/2021   POLYPECTOMY N/A 06/14/2018   Procedure: POLYPECTOMY;  Surgeon: Midge Minium, MD;  Location: [facility];  Service: Endoscopy;  Laterality: N/A;   TOOTH EXTRACTION     2010   TOTAL KNEE ARTHROPLASTY Left 05/25/2015   Procedure: LEFT TOTAL KNEE ARTHROPLASTY;  Surgeon: Sheral Apley, MD;  Location: [facility] OR;  Service: Orthopedics;  Laterality: Left;   TUBAL LIGATION     2004    Family History  Problem Relation Age of Onset   Stroke Mother    Heart attack Mother    AAA (abdominal aortic aneurysm) Mother    Kidney cancer Mother    Atrial fibrillation Mother    Diabetes Father    Dementia Father    Breast cancer Sister 43   Leukemia Sister    Breast cancer Maternal Grandmother        late 50's   Breast cancer Maternal Aunt 19       BRACA + dx twice age 28 and 73   Arthritis Other        parent   Hyperlipidemia Other        parent   Stroke Other        parent   Hypertension Other        parent   Kidney disease Other        parent, grandparent   Diabetes Other        parent, grandparent   Renal cancer Other        parent    Social History   Tobacco Use   Smoking status: Never    Passive exposure: Current   Smokeless tobacco: Never  Vaping Use   Vaping status: Never Used  Substance Use Topics   Alcohol use: No    Alcohol/week: 0.0 standard drinks of alcohol   Drug use: No    ROS   Objective:   Vitals: BP 100/72 (BP Location: Right Arm)   Pulse 83   Temp 98.1 F (36.7 C) (Oral)   Resp 16   LMP 08/24/2018 (Exact Date)   SpO2 97%   Physical Exam Constitutional:       General: She is not in acute distress.    Appearance: Normal appearance. She is well-developed and normal weight. She is not ill-appearing, toxic-appearing or diaphoretic.  HENT:     Head: Normocephalic and atraumatic.     Right Ear: Tympanic membrane, ear canal and external ear  normal. No drainage or tenderness. No middle ear effusion. There is no impacted cerumen. Tympanic membrane is not erythematous or bulging.     Left Ear: Ear canal and external ear normal. Tenderness present. No drainage. A middle ear effusion is present. There is no impacted cerumen. Tympanic membrane is not erythematous or bulging.     Nose: No congestion or rhinorrhea.     Mouth/Throat:     Mouth: Mucous membranes are moist. No oral lesions.     Pharynx: No pharyngeal swelling, oropharyngeal exudate, posterior oropharyngeal erythema or uvula swelling.     Tonsils: No tonsillar exudate or tonsillar abscesses.     Comments: Significant drainage overlying pharynx. Eyes:     General: No scleral icterus.       Right eye: No discharge.        Left eye: No discharge.     Extraocular Movements: Extraocular movements intact.     Right eye: Normal extraocular motion.     Left eye: Normal extraocular motion.     Conjunctiva/sclera: Conjunctivae normal.  Cardiovascular:     Rate and Rhythm: Normal rate.  Pulmonary:     Effort: Pulmonary effort is normal.  Musculoskeletal:     Cervical back: Normal range of motion and neck supple.  Lymphadenopathy:     Cervical: No cervical adenopathy.  Skin:    General: Skin is warm and dry.  Neurological:     General: No focal deficit present.     Mental Status: She is alert and oriented to person, place, and time.  Psychiatric:        Mood and Affect: Mood normal.        Behavior: Behavior normal.     Assessment and Plan :   I have reviewed the PDMP during this encounter.  1. Acute otitis media, unspecified otitis media type   2. Allergic rhinitis, unspecified seasonality,  unspecified trigger    Will cover for a middle ear infection of the left side with cefdinir.  Patient has taken cefazolin multiple times in the past.  She should be able to tolerate cefdinir.  Recommend Zyrtec and pseudoephedrine for supportive care.  Counseled patient on potential for adverse effects with medications prescribed/recommended today, ER and return-to-clinic precautions discussed, patient verbalized understanding.    Wallis Bamberg, PA-C 09/04/22 1414

## 2022-09-08 ENCOUNTER — Other Ambulatory Visit (HOSPITAL_BASED_OUTPATIENT_CLINIC_OR_DEPARTMENT_OTHER): Payer: Self-pay

## 2022-09-08 ENCOUNTER — Encounter: Payer: Self-pay | Admitting: Family

## 2022-09-08 ENCOUNTER — Ambulatory Visit: Payer: Commercial Managed Care - HMO | Admitting: Family

## 2022-09-08 DIAGNOSIS — M19171 Post-traumatic osteoarthritis, right ankle and foot: Secondary | ICD-10-CM

## 2022-09-08 MED ORDER — OXYCODONE-ACETAMINOPHEN 5-325 MG PO TABS
1.0000 | ORAL_TABLET | Freq: Four times a day (QID) | ORAL | 0 refills | Status: DC | PRN
  Filled 2022-09-08: qty 30, 8d supply, fill #0

## 2022-09-08 NOTE — Progress Notes (Signed)
Post-Op Visit Note   Patient: Whitney Hill           Date of Birth: 01/31/1970           MRN: [id number] Visit Date: 09/08/2022 PCP: Glori Luis, MD  Chief Complaint: No chief complaint on file.   HPI:  HPI The patient is a 52 year old woman who is seen 2 weeks status post right ankle arthroscopy.  She states that she has been limiting her weightbearing some she does have significant pain with start up.  She feels her pain is mildly improved as compared to prior to surgery.  She does still have significant pain especially with weightbearing none at rest complains of some swelling.  No new numbness or tingling or weakness Ortho Exam On examination right ankle her portals are well-healed sutures are in place there is minimal edema of the ankle  Visit Diagnoses: No diagnosis found.  Plan: Sutures harvested today without incident.  Discussed typical course.  She will continue with a home exercise program follow-up in the office with Dr. Debroah Loop in 4 weeks.  She would like to discuss possible total knee arthroplasty on the right at that time  Follow-Up Instructions: No follow-ups on file.   Imaging: No results found.  Orders:  No orders of the defined types were placed in this encounter.  No orders of the defined types were placed in this encounter.    PMFS History: Patient Active Problem List   Diagnosis Date Noted   Traumatic arthritis of right ankle 08/25/2022   Fibromyalgia 06/12/2022   Positive ANA (antinuclear antibody) 05/19/2022   Anxiety and depression 01/23/2022   Breast pain, left 12/08/2021   New onset of headaches after age 73 12/08/2021   Endometrial polyp 06/08/2021   Rib pain on left side 06/08/2021   History of gastric ulcer 06/08/2021   Urine frequency 06/08/2021   Post-traumatic osteoarthritis of right ankle 04/08/2021   Encounter for general adult medical examination with abnormal findings 12/15/2020   History of COVID-19 12/15/2020    Trochanteric bursitis, left hip 10/08/2020   Proteinuria 01/19/2020   Arthritis of right knee 08/21/2019   Nocturia 08/05/2019   History of abnormal cervical Pap smear 07/09/2018   Polyp of sigmoid colon    Hypokalemia 02/05/2018   Thiamine deficiency 01/17/2018   Arthralgia 09/18/2017   Tick bite 09/18/2017   Vitamin D deficiency 09/18/2017   Vision loss 09/18/2017   Constipation 04/28/2016   Right ankle pain 04/28/2016   Abnormal uterine bleeding 02/07/2016   Iron deficiency 02/04/2016   Special screening for malignant neoplasms, colon 10/14/2015   Skin nodule 09/20/2015   Status post left partial knee replacement, medial aspect 07/09/2015   Obesity (BMI 30.0-34.9) 07/09/2015   Iron deficiency anemia 01/14/2015   Hypothyroidism 10/01/2014   Cramps, extremity 10/01/2014   Allergic rhinitis 10/01/2014   S/P gastric bypass 10/01/2014   Past Medical History:  Diagnosis Date   Arthritis    OA   Complication of anesthesia    "ITCHING TO FACE, BENADRYL HELP BUT DROPS BLOOD PRESSURE"   DVT (deep venous thrombosis) (HCC)    following first knee surgery in 2011   Gastric ulcer    Hashimoto's disease 09/18/2017   Headache    Heterozygous factor V Leiden mutation (HCC)    Hives    chronic   Hypothyroid    Iron deficiency anemia    Migraine     Family History  Problem Relation Age of Onset  Stroke Mother    Heart attack Mother    AAA (abdominal aortic aneurysm) Mother    Kidney cancer Mother    Atrial fibrillation Mother    Diabetes Father    Dementia Father    Breast cancer Sister 60   Leukemia Sister    Breast cancer Maternal Grandmother        late 50's   Breast cancer Maternal Aunt 19       BRACA + dx twice age 69 and 61   Arthritis Other        parent   Hyperlipidemia Other        parent   Stroke Other        parent   Hypertension Other        parent   Kidney disease Other        parent, grandparent   Diabetes Other        parent, grandparent   Renal  cancer Other        parent    Past Surgical History:  Procedure Laterality Date   ANKLE ARTHROSCOPY Right 08/25/2022   Procedure: RIGHT ANKLE ARTHROSCOPY;  Surgeon: Nadara Mustard, MD;  Location: [facility] OR;  Service: Orthopedics;  Laterality: Right;   ANKLE FRACTURE SURGERY     1994, 1996   CHOLECYSTECTOMY     1998   COLONOSCOPY WITH PROPOFOL N/A 06/14/2018   Procedure: COLONOSCOPY WITH BIOPSY;  Surgeon: Midge Minium, MD;  Location: [facility];  Service: Endoscopy;  Laterality: N/A;   DILATION AND CURETTAGE OF UTERUS     1994, 2001   ESOPHAGOGASTRODUODENOSCOPY (EGD) WITH PROPOFOL N/A 06/14/2018   Procedure: ESOPHAGOGASTRODUODENOSCOPY (EGD) WITH PROPOFOL;  Surgeon: Midge Minium, MD;  Location: [facility];  Service: Endoscopy;  Laterality: N/A;   FOREIGN BODY REMOVAL N/A 06/14/2018   Procedure: FOREIGN BODY REMOVAL;  Surgeon: Midge Minium, MD;  Location: [facility] SUR[facility];  Service: Endoscopy;  Laterality: N/A;  Staple Removal from Stomach   GASTRIC BYPASS     2012, revision 2013   KNEE ARTHROSCOPY     2011, 2013, 2015   KNEE SURGERY Right 09/21/2021   POLYPECTOMY N/A 06/14/2018   Procedure: POLYPECTOMY;  Surgeon: Midge Minium, MD;  Location: [facility];  Service: Endoscopy;  Laterality: N/A;   TOOTH EXTRACTION     2010   TOTAL KNEE ARTHROPLASTY Left 05/25/2015   Procedure: LEFT TOTAL KNEE ARTHROPLASTY;  Surgeon: Sheral Apley, MD;  Location: [facility] OR;  Service: Orthopedics;  Laterality: Left;   TUBAL LIGATION     2004   Social History   Occupational History   Not on file  Tobacco Use   Smoking status: Never    Passive exposure: Current   Smokeless tobacco: Never  Vaping Use   Vaping status: Never Used  Substance and Sexual Activity   Alcohol use: No    Alcohol/week: 0.0 standard drinks of alcohol   Drug use: No   Sexual activity: Not on file

## 2022-09-13 ENCOUNTER — Encounter: Payer: Commercial Managed Care - HMO | Admitting: Family

## 2022-09-15 ENCOUNTER — Ambulatory Visit (HOSPITAL_BASED_OUTPATIENT_CLINIC_OR_DEPARTMENT_OTHER)
Admission: RE | Admit: 2022-09-15 | Discharge: 2022-09-15 | Disposition: A | Discharge status: Home / Self Care | Payer: Commercial Managed Care - HMO | Source: Ambulatory Visit | Attending: Surgical | Admitting: Surgical

## 2022-09-15 ENCOUNTER — Other Ambulatory Visit: Payer: Self-pay

## 2022-09-15 ENCOUNTER — Telehealth: Payer: Self-pay | Admitting: Orthopedic Surgery

## 2022-09-15 DIAGNOSIS — M79661 Pain in right lower leg: Secondary | ICD-10-CM | POA: Insufficient documentation

## 2022-09-15 DIAGNOSIS — M19171 Post-traumatic osteoarthritis, right ankle and foot: Secondary | ICD-10-CM

## 2022-09-15 NOTE — Telephone Encounter (Signed)
I called pt and she is s/p a right ankle scope and debridement on 08/25/2022 she is Factor 5 clotting disorder started on eliquis at time of surgery and is 3 weeks out with increased pain and swelling that does resolve with elevation but slight discoloration and "feels hard" spoke with Franky Macho ok to get U/S to r/o DVT and call report to him. Will call vascular lab to see when they can get pt in for test.

## 2022-09-15 NOTE — Telephone Encounter (Signed)
Pt called in stating she is having issues and concerns with pain and clotting and would like to speak to someone either a nurse or Lajoyce Corners please advise pt had Right ankle surgery on 08/16

## 2022-09-15 NOTE — Telephone Encounter (Signed)
Called pt and advised no DVT

## 2022-09-15 NOTE — Telephone Encounter (Signed)
I called and sw Jessica at the [facility] imaging [phone] and was advised pt can come today at 4pm order in chart and marked stat with call report to [facility]. Pt is aware and will go today for U/S

## 2022-09-16 ENCOUNTER — Other Ambulatory Visit: Payer: Self-pay | Admitting: Family Medicine

## 2022-09-16 DIAGNOSIS — E039 Hypothyroidism, unspecified: Secondary | ICD-10-CM

## 2022-09-17 ENCOUNTER — Other Ambulatory Visit (HOSPITAL_BASED_OUTPATIENT_CLINIC_OR_DEPARTMENT_OTHER): Payer: Self-pay

## 2022-09-18 ENCOUNTER — Other Ambulatory Visit (HOSPITAL_BASED_OUTPATIENT_CLINIC_OR_DEPARTMENT_OTHER): Payer: Self-pay

## 2022-09-20 ENCOUNTER — Encounter: Payer: Self-pay | Admitting: Orthopedic Surgery

## 2022-09-26 ENCOUNTER — Ambulatory Visit (INDEPENDENT_AMBULATORY_CARE_PROVIDER_SITE_OTHER): Payer: Commercial Managed Care - HMO | Admitting: Orthopedic Surgery

## 2022-09-26 ENCOUNTER — Encounter: Payer: Self-pay | Admitting: Orthopedic Surgery

## 2022-09-26 DIAGNOSIS — M19171 Post-traumatic osteoarthritis, right ankle and foot: Secondary | ICD-10-CM | POA: Diagnosis not present

## 2022-09-26 DIAGNOSIS — M1711 Unilateral primary osteoarthritis, right knee: Secondary | ICD-10-CM

## 2022-09-26 MED ORDER — LIDOCAINE HCL 1 % IJ SOLN
2.0000 mL | INTRAMUSCULAR | Status: AC | PRN
Start: 2022-09-26 — End: 2022-09-26
  Administered 2022-09-26: 2 mL

## 2022-09-26 MED ORDER — METHYLPREDNISOLONE ACETATE 40 MG/ML IJ SUSP
40.0000 mg | INTRAMUSCULAR | Status: AC | PRN
Start: 2022-09-26 — End: 2022-09-26
  Administered 2022-09-26: 40 mg via INTRA_ARTICULAR

## 2022-09-26 NOTE — Progress Notes (Signed)
Office Visit Note   Patient: Whitney Hill           Date of Birth: [birth year]           MRN: [id number] Visit Date: 09/26/2022              Requested by: Glori Luis, MD [address] [place],  [place] [address] PCP: Glori Luis, MD  Chief Complaint  Patient presents with   Right Ankle - Routine Post Op    08/25/22 right ankle scope      HPI: Patient is a 52 year old woman who presents in follow-up 4 weeks status post right ankle arthroscopy for traumatic arthritis.  Patient also has arthritic pain in the right knee as well.  Assessment & Plan: Visit Diagnoses:  1. Post-traumatic osteoarthritis of right ankle   2. Arthritis of right knee     Plan: The right ankle was injected she tolerated this well reevaluate in 4 weeks.  Discussed the possibility of injection of the right knee at follow-up.  Follow-Up Instructions: Return in about 4 weeks (around 10/24/2022).   Ortho Exam  Patient is alert, oriented, no adenopathy, well-dressed, normal affect, normal respiratory effort. Examination of the right ankle there is no effusion there is no redness no cellulitis.  She has good passive range of motion.  Ultrasound was negative for DVT she has been using Voltaren gel.  Imaging: No results found. No images are attached to the encounter.  Labs: Lab Results  Component Value Date   HGBA1C 5.9 12/15/2020   HGBA1C 5.7 08/05/2019   HGBA1C 5.4 01/14/2018   ESRSEDRATE 11 05/19/2022   ESRSEDRATE 16 09/18/2017   CRP <3.0 05/19/2022   CRP 0.2 (L) 09/18/2017   REPTSTATUS 08/15/2019 FINAL 08/13/2019   CULT >=100,000 COLONIES/mL ESCHERICHIA COLI (A) 08/13/2019   LABORGA ESCHERICHIA COLI (A) 08/13/2019     Lab Results  Component Value Date   ALBUMIN 4.1 07/31/2022   ALBUMIN 4.2 12/08/2021   ALBUMIN 4.3 12/15/2020    Lab Results  Component Value Date   MG 2.0 01/18/2018   Lab Results  Component Value Date   VD25OH 14.52 (L) 07/31/2022    VD25OH 16.76 (L) 04/19/2022   VD25OH 8.75 (L) 01/23/2022    No results found for: "PREALBUMIN"    Latest Ref Rng & Units 07/31/2022   10:10 AM 12/08/2020   12:10 AM 10/22/2020    3:27 PM  CBC EXTENDED  WBC 4.0 - 10.5 K/uL 6.7  8.7  7.9   RBC 3.87 - 5.11 Mil/uL 4.15  4.15  4.02   Hemoglobin 12.0 - 15.0 g/dL 82.9  56.2  13.0   HCT 36.0 - 46.0 % 40.8  40.0  39.4   Platelets 150.0 - 400.0 K/uL 297.0  320  321   NEUT# 1.7 - 7.7 K/uL   4.9   Lymph# 0.7 - 4.0 K/uL   2.3      There is no height or weight on file to calculate BMI.  Orders:  No orders of the defined types were placed in this encounter.  No orders of the defined types were placed in this encounter.    Procedures: Medium Joint Inj: R ankle on 09/26/2022 12:53 PM Indications: pain and diagnostic evaluation Details: 22 G 1.5 in needle, anteromedial approach Medications: 2 mL lidocaine 1 %; 40 mg methylPREDNISolone acetate 40 MG/ML Outcome: tolerated well, no immediate complications Procedure, treatment alternatives, risks and benefits explained, specific risks discussed. Consent was given  by the patient. Immediately prior to procedure a time out was called to verify the correct patient, procedure, equipment, support staff and site/side marked as required. Patient was prepped and draped in the usual sterile fashion.      Clinical Data: No additional findings.  ROS:  All other systems negative, except as noted in the HPI. Review of Systems  Objective: Vital Signs: LMP 08/24/2018 (Exact Date)   Specialty Comments:  No specialty comments available.  PMFS History: Patient Active Problem List   Diagnosis Date Noted   Traumatic arthritis of right ankle 08/25/2022   Fibromyalgia 06/12/2022   Positive ANA (antinuclear antibody) 05/19/2022   Anxiety and depression 01/23/2022   Breast pain, left 12/08/2021   New onset of headaches after age 31 12/08/2021   Endometrial polyp 06/08/2021   Rib pain on left side  06/08/2021   History of gastric ulcer 06/08/2021   Urine frequency 06/08/2021   Post-traumatic osteoarthritis of right ankle 04/08/2021   Encounter for general adult medical examination with abnormal findings 12/15/2020   History of COVID-19 12/15/2020   Trochanteric bursitis, left hip 10/08/2020   Proteinuria 01/19/2020   Arthritis of right knee 08/21/2019   Nocturia 08/05/2019   History of abnormal cervical Pap smear 07/09/2018   Polyp of sigmoid colon    Hypokalemia 02/05/2018   Thiamine deficiency 01/17/2018   Arthralgia 09/18/2017   Tick bite 09/18/2017   Vitamin D deficiency 09/18/2017   Vision loss 09/18/2017   Constipation 04/28/2016   Right ankle pain 04/28/2016   Abnormal uterine bleeding 02/07/2016   Iron deficiency 02/04/2016   Special screening for malignant neoplasms, colon 10/14/2015   Skin nodule 09/20/2015   Status post left partial knee replacement, medial aspect 07/09/2015   Obesity (BMI 30.0-34.9) 07/09/2015   Iron deficiency anemia 01/14/2015   Hypothyroidism 10/01/2014   Cramps, extremity 10/01/2014   Allergic rhinitis 10/01/2014   S/P gastric bypass 10/01/2014   Past Medical History:  Diagnosis Date   Arthritis    OA   Complication of anesthesia    "ITCHING TO FACE, BENADRYL HELP BUT DROPS BLOOD PRESSURE"   DVT (deep venous thrombosis) (HCC)    following first knee surgery in 2011   Gastric ulcer    Hashimoto's disease 09/18/2017   Headache    Heterozygous factor V Leiden mutation (HCC)    Hives    chronic   Hypothyroid    Iron deficiency anemia    Migraine     Family History  Problem Relation Age of Onset   Stroke Mother    Heart attack Mother    AAA (abdominal aortic aneurysm) Mother    Kidney cancer Mother    Atrial fibrillation Mother    Diabetes Father    Dementia Father    Breast cancer Sister 60   Leukemia Sister    Breast cancer Maternal Grandmother        late 50's   Breast cancer Maternal Aunt 39       BRACA + dx twice  age 5 and 22   Arthritis Other        parent   Hyperlipidemia Other        parent   Stroke Other        parent   Hypertension Other        parent   Kidney disease Other        parent, grandparent   Diabetes Other        parent, grandparent   Renal cancer  Other        parent    Past Surgical History:  Procedure Laterality Date   ANKLE ARTHROSCOPY Right 08/25/2022   Procedure: RIGHT ANKLE ARTHROSCOPY;  Surgeon: Nadara Mustard, MD;  Location[facility] OR;  Service: Orthopedics;  Laterality: Right;   ANKLE FRACTURE SURGERY     1994, 1996   CHOLECYSTECTOMY     1998   COLONOSCOPY WITH PROPOFOL N/A 06/14/2018   Procedure: COLONOSCOPY WITH BIOPSY;  Surgeon: Midge Minium, MD;  Location: [facility];  Service: Endoscopy;  Laterality: N/A;   DILATION AND CURETTAGE OF UTERUS     1994, 2001   ESOPHAGOGASTRODUODENOSCOPY (EGD) WITH PROPOFOL N/A 06/14/2018   Procedure: ESOPHAGOGASTRODUODENOSCOPY (EGD) WITH PROPOFOL;  Surgeon: Midge Minium, MD;  Location: [facility];  Service: Endoscopy;  Laterality: N/A;   FOREIGN BODY REMOVAL N/A 06/14/2018   Procedure: FOREIGN BODY REMOVAL;  Surgeon: Midge Minium, MD;  Location: [facility];  Service: Endoscopy;  Laterality: N/A;  Staple Removal from Stomach   GASTRIC BYPASS     2012, revision 2013   KNEE ARTHROSCOPY     2011, 2013, 2015   KNEE SURGERY Right 09/21/2021   POLYPECTOMY N/A 06/14/2018   Procedure: POLYPECTOMY;  Surgeon: Midge Minium, MD;  Location: [facility];  Service: Endoscopy;  Laterality: N/A;   TOOTH EXTRACTION     2010   TOTAL KNEE ARTHROPLASTY Left 05/25/2015   Procedure: LEFT TOTAL KNEE ARTHROPLASTY;  Surgeon: Sheral Apley, MD;  Location: [facility] OR;  Service: Orthopedics;  Laterality: Left;   TUBAL LIGATION     2004   Social History   Occupational History   Not on file  Tobacco Use   Smoking status: Never    Passive exposure: Current   Smokeless tobacco: Never  Vaping Use   Vaping status:  Never Used  Substance and Sexual Activity   Alcohol use: No    Alcohol/week: 0.0 standard drinks of alcohol   Drug use: No   Sexual activity: Not on file

## 2022-10-05 ENCOUNTER — Ambulatory Visit: Payer: Commercial Managed Care - HMO | Admitting: Orthopedic Surgery

## 2022-10-06 ENCOUNTER — Other Ambulatory Visit: Payer: Self-pay | Admitting: Family Medicine

## 2022-10-06 ENCOUNTER — Other Ambulatory Visit (HOSPITAL_BASED_OUTPATIENT_CLINIC_OR_DEPARTMENT_OTHER): Payer: Self-pay

## 2022-10-06 MED ORDER — SUMATRIPTAN SUCCINATE 50 MG PO TABS
ORAL_TABLET | ORAL | 0 refills | Status: DC
  Filled 2022-10-06: qty 9, 13d supply, fill #0
  Filled 2022-10-11: qty 9, 14d supply, fill #0

## 2022-10-11 ENCOUNTER — Other Ambulatory Visit (HOSPITAL_BASED_OUTPATIENT_CLINIC_OR_DEPARTMENT_OTHER): Payer: Self-pay

## 2022-10-17 ENCOUNTER — Encounter: Payer: Self-pay | Admitting: Internal Medicine

## 2022-10-20 ENCOUNTER — Other Ambulatory Visit: Payer: Self-pay | Admitting: Family Medicine

## 2022-10-20 ENCOUNTER — Other Ambulatory Visit (HOSPITAL_BASED_OUTPATIENT_CLINIC_OR_DEPARTMENT_OTHER): Payer: Self-pay

## 2022-10-20 MED ORDER — SUMATRIPTAN SUCCINATE 50 MG PO TABS
ORAL_TABLET | ORAL | 0 refills | Status: DC
  Filled 2022-10-20: qty 9, 13d supply, fill #0
  Filled 2022-10-24: qty 9, 5d supply, fill #0
  Filled 2022-10-25: qty 9, 14d supply, fill #0
  Filled 2022-10-27 – 2022-11-03 (×2): qty 9, 30d supply, fill #0

## 2022-10-24 ENCOUNTER — Ambulatory Visit: Payer: Managed Care, Other (non HMO) | Admitting: Orthopedic Surgery

## 2022-10-24 DIAGNOSIS — M1711 Unilateral primary osteoarthritis, right knee: Secondary | ICD-10-CM

## 2022-10-24 DIAGNOSIS — M19171 Post-traumatic osteoarthritis, right ankle and foot: Secondary | ICD-10-CM

## 2022-10-25 ENCOUNTER — Encounter: Payer: Self-pay | Admitting: Orthopedic Surgery

## 2022-10-25 ENCOUNTER — Other Ambulatory Visit (HOSPITAL_BASED_OUTPATIENT_CLINIC_OR_DEPARTMENT_OTHER): Payer: Self-pay

## 2022-10-25 DIAGNOSIS — M1711 Unilateral primary osteoarthritis, right knee: Secondary | ICD-10-CM | POA: Diagnosis not present

## 2022-10-25 MED ORDER — LIDOCAINE HCL (PF) 1 % IJ SOLN
5.0000 mL | INTRAMUSCULAR | Status: AC | PRN
Start: 2022-10-25 — End: 2022-10-25
  Administered 2022-10-25: 5 mL

## 2022-10-25 MED ORDER — METHYLPREDNISOLONE ACETATE 40 MG/ML IJ SUSP
40.0000 mg | INTRAMUSCULAR | Status: AC | PRN
Start: 2022-10-25 — End: 2022-10-25
  Administered 2022-10-25: 40 mg via INTRA_ARTICULAR

## 2022-10-25 NOTE — Progress Notes (Signed)
Office Visit Note   Patient: Whitney Hill           Date of Birth: 1970-04-13           MRN: [id number] Visit Date: 10/24/2022              Requested by: Glori Luis, MD [address] [place],  [place] [address] PCP: Glori Luis, MD  Chief Complaint  Patient presents with   Right Ankle - Routine Post Op    08/25/22 right ankle scope S/p injection 09/26/2022      HPI: Patient is a 52 year old woman who presents with 2 separate issues.  She is status post right ankle arthroscopy 2 months ago and has had chronic osteoarthritis of her right knee.  Patient states she has been having increasing pain in the right knee.  She states that the right ankle is about 80% better after arthroscopic debridement.  Assessment & Plan: Visit Diagnoses:  1. Post-traumatic osteoarthritis of right ankle   2. Arthritis of right knee     Plan: Right knee was injected she will continue increasing activities with both the knee and the ankle.  Follow-Up Instructions: No follow-ups on file.   Ortho Exam  Patient is alert, oriented, no adenopathy, well-dressed, normal affect, normal respiratory effort. Examination there is no pain with passive range of motion of the right ankle.  Patient does have pain over the anterior scar line from her initial ankle fracture.  There is no redness or cellulitis around the ankle.  Right knee has minimal swelling crepitation with range of motion and tenderness to palpation of the medial and lateral joint line.  Imaging: No results found. No images are attached to the encounter.  Labs: Lab Results  Component Value Date   HGBA1C 5.9 12/15/2020   HGBA1C 5.7 08/05/2019   HGBA1C 5.4 01/14/2018   ESRSEDRATE 11 05/19/2022   ESRSEDRATE 16 09/18/2017   CRP <3.0 05/19/2022   CRP 0.2 (L) 09/18/2017   REPTSTATUS 08/15/2019 FINAL 08/13/2019   CULT >=100,000 COLONIES/mL ESCHERICHIA COLI (A) 08/13/2019   LABORGA ESCHERICHIA COLI (A) 08/13/2019      Lab Results  Component Value Date   ALBUMIN 4.1 07/31/2022   ALBUMIN 4.2 12/08/2021   ALBUMIN 4.3 12/15/2020    Lab Results  Component Value Date   MG 2.0 01/18/2018   Lab Results  Component Value Date   VD25OH 14.52 (L) 07/31/2022   VD25OH 16.76 (L) 04/19/2022   VD25OH 8.75 (L) 01/23/2022    No results found for: "PREALBUMIN"    Latest Ref Rng & Units 07/31/2022   10:10 AM 12/08/2020   12:10 AM 10/22/2020    3:27 PM  CBC EXTENDED  WBC 4.0 - 10.5 K/uL 6.7  8.7  7.9   RBC 3.87 - 5.11 Mil/uL 4.15  4.15  4.02   Hemoglobin 12.0 - 15.0 g/dL 21.3  08.6  57.8   HCT 36.0 - 46.0 % 40.8  40.0  39.4   Platelets 150.0 - 400.0 K/uL 297.0  320  321   NEUT# 1.7 - 7.7 K/uL   4.9   Lymph# 0.7 - 4.0 K/uL   2.3      There is no height or weight on file to calculate BMI.  Orders:  No orders of the defined types were placed in this encounter.  No orders of the defined types were placed in this encounter.    Procedures: Large Joint Inj: R knee on 10/25/2022 3:02 PM  Indications: pain and diagnostic evaluation Details: 22 G 1.5 in needle, anteromedial approach  Arthrogram: No  Medications: 5 mL lidocaine (PF) 1 %; 40 mg methylPREDNISolone acetate 40 MG/ML Outcome: tolerated well, no immediate complications Procedure, treatment alternatives, risks and benefits explained, specific risks discussed. Consent was given by the patient. Immediately prior to procedure a time out was called to verify the correct patient, procedure, equipment, support staff and site/side marked as required. Patient was prepped and draped in the usual sterile fashion.      Clinical Data: No additional findings.  ROS:  All other systems negative, except as noted in the HPI. Review of Systems  Objective: Vital Signs: LMP 08/24/2018 (Exact Date)   Specialty Comments:  No specialty comments available.  PMFS History: Patient Active Problem List   Diagnosis Date Noted   Traumatic arthritis of  right ankle 08/25/2022   Fibromyalgia 06/12/2022   Positive ANA (antinuclear antibody) 05/19/2022   Anxiety and depression 01/23/2022   Breast pain, left 12/08/2021   New onset of headaches after age 50 12/08/2021   Endometrial polyp 06/08/2021   Rib pain on left side 06/08/2021   History of gastric ulcer 06/08/2021   Urine frequency 06/08/2021   Post-traumatic osteoarthritis of right ankle 04/08/2021   Encounter for general adult medical examination with abnormal findings 12/15/2020   History of COVID-19 12/15/2020   Trochanteric bursitis, left hip 10/08/2020   Proteinuria 01/19/2020   Arthritis of right knee 08/21/2019   Nocturia 08/05/2019   History of abnormal cervical Pap smear 07/09/2018   Polyp of sigmoid colon    Hypokalemia 02/05/2018   Thiamine deficiency 01/17/2018   Arthralgia 09/18/2017   Tick bite 09/18/2017   Vitamin D deficiency 09/18/2017   Vision loss 09/18/2017   Constipation 04/28/2016   Right ankle pain 04/28/2016   Abnormal uterine bleeding 02/07/2016   Iron deficiency 02/04/2016   Special screening for malignant neoplasms, colon 10/14/2015   Skin nodule 09/20/2015   Status post left partial knee replacement, medial aspect 07/09/2015   Obesity (BMI 30.0-34.9) 07/09/2015   Iron deficiency anemia 01/14/2015   Hypothyroidism 10/01/2014   Cramps, extremity 10/01/2014   Allergic rhinitis 10/01/2014   S/P gastric bypass 10/01/2014   Past Medical History:  Diagnosis Date   Arthritis    OA   Complication of anesthesia    "ITCHING TO FACE, BENADRYL HELP BUT DROPS BLOOD PRESSURE"   DVT (deep venous thrombosis) (HCC)    following first knee surgery in 2011   Gastric ulcer    Hashimoto's disease 09/18/2017   Headache    Heterozygous factor V Leiden mutation (HCC)    Hives    chronic   Hypothyroid    Iron deficiency anemia    Migraine     Family History  Problem Relation Age of Onset   Stroke Mother    Heart attack Mother    AAA (abdominal aortic  aneurysm) Mother    Kidney cancer Mother    Atrial fibrillation Mother    Diabetes Father    Dementia Father    Breast cancer Sister 35   Leukemia Sister    Breast cancer Maternal Grandmother        late 50's   Breast cancer Maternal Aunt 7       BRACA + dx twice age 75 and 35   Arthritis Other        parent   Hyperlipidemia Other        parent   Stroke Other  parent   Hypertension Other        parent   Kidney disease Other        parent, grandparent   Diabetes Other        parent, grandparent   Renal cancer Other        parent    Past Surgical History:  Procedure Laterality Date   ANKLE ARTHROSCOPY Right 08/25/2022   Procedure: RIGHT ANKLE ARTHROSCOPY;  Surgeon: Nadara Mustard, MD;  Location: [facility] OR;  Service: Orthopedics;  Laterality: Right;   ANKLE FRACTURE SURGERY     1994, 1996   CHOLECYSTECTOMY     1998   COLONOSCOPY WITH PROPOFOL N/A 06/14/2018   Procedure: COLONOSCOPY WITH BIOPSY;  Surgeon: Midge Minium, MD;  Location: [facility];  Service: Endoscopy;  Laterality: N/A;   DILATION AND CURETTAGE OF UTERUS     1994, 2001   ESOPHAGOGASTRODUODENOSCOPY (EGD) WITH PROPOFOL N/A 06/14/2018   Procedure: ESOPHAGOGASTRODUODENOSCOPY (EGD) WITH PROPOFOL;  Surgeon: Midge Minium, MD;  Location: [facility];  Service: Endoscopy;  Laterality: N/A;   FOREIGN BODY REMOVAL N/A 06/14/2018   Procedure: FOREIGN BODY REMOVAL;  Surgeon: Midge Minium, MD;  Location: [facility];  Service: Endoscopy;  Laterality: N/A;  Staple Removal from Stomach   GASTRIC BYPASS     2012, revision 2013   KNEE ARTHROSCOPY     2011, 2013, 2015   KNEE SURGERY Right 09/21/2021   POLYPECTOMY N/A 06/14/2018   Procedure: POLYPECTOMY;  Surgeon: Midge Minium, MD;  Location: [facility];  Service: Endoscopy;  Laterality: N/A;   TOOTH EXTRACTION     2010   TOTAL KNEE ARTHROPLASTY Left 05/25/2015   Procedure: LEFT TOTAL KNEE ARTHROPLASTY;  Surgeon: Sheral Apley, MD;   Location: [facility] OR;  Service: Orthopedics;  Laterality: Left;   TUBAL LIGATION     2004   Social History   Occupational History   Not on file  Tobacco Use   Smoking status: Never    Passive exposure: Current   Smokeless tobacco: Never  Vaping Use   Vaping status: Never Used  Substance and Sexual Activity   Alcohol use: No    Alcohol/week: 0.0 standard drinks of alcohol   Drug use: No   Sexual activity: Not on file

## 2022-10-27 ENCOUNTER — Other Ambulatory Visit (HOSPITAL_BASED_OUTPATIENT_CLINIC_OR_DEPARTMENT_OTHER): Payer: Self-pay

## 2022-10-30 ENCOUNTER — Other Ambulatory Visit (HOSPITAL_BASED_OUTPATIENT_CLINIC_OR_DEPARTMENT_OTHER): Payer: Self-pay

## 2022-11-01 ENCOUNTER — Ambulatory Visit: Payer: Managed Care, Other (non HMO) | Admitting: Family Medicine

## 2022-11-01 VITALS — BP 116/78 | HR 91 | Temp 98.1°F | Ht 65.0 in | Wt 200.2 lb

## 2022-11-01 DIAGNOSIS — N644 Mastodynia: Secondary | ICD-10-CM

## 2022-11-01 DIAGNOSIS — M1711 Unilateral primary osteoarthritis, right knee: Secondary | ICD-10-CM | POA: Diagnosis not present

## 2022-11-01 DIAGNOSIS — Z23 Encounter for immunization: Secondary | ICD-10-CM

## 2022-11-01 DIAGNOSIS — G43909 Migraine, unspecified, not intractable, without status migrainosus: Secondary | ICD-10-CM | POA: Insufficient documentation

## 2022-11-01 DIAGNOSIS — K257 Chronic gastric ulcer without hemorrhage or perforation: Secondary | ICD-10-CM

## 2022-11-01 DIAGNOSIS — Z124 Encounter for screening for malignant neoplasm of cervix: Secondary | ICD-10-CM

## 2022-11-01 DIAGNOSIS — E60 Dietary zinc deficiency: Secondary | ICD-10-CM

## 2022-11-01 DIAGNOSIS — M25571 Pain in right ankle and joints of right foot: Secondary | ICD-10-CM

## 2022-11-01 DIAGNOSIS — E66811 Obesity, class 1: Secondary | ICD-10-CM

## 2022-11-01 DIAGNOSIS — G43809 Other migraine, not intractable, without status migrainosus: Secondary | ICD-10-CM | POA: Diagnosis not present

## 2022-11-01 DIAGNOSIS — G8929 Other chronic pain: Secondary | ICD-10-CM

## 2022-11-01 LAB — HEMOGLOBIN A1C: Hgb A1c MFr Bld: 5.5 % (ref 4.6–6.5)

## 2022-11-01 MED ORDER — TOPIRAMATE 25 MG PO TABS
25.0000 mg | ORAL_TABLET | Freq: Every day | ORAL | 2 refills | Status: DC
Start: 2022-11-01 — End: 2023-02-02

## 2022-11-01 MED ORDER — PANTOPRAZOLE SODIUM 40 MG PO TBEC
40.0000 mg | DELAYED_RELEASE_TABLET | Freq: Every day | ORAL | 3 refills | Status: DC
Start: 2022-11-01 — End: 2023-03-14

## 2022-11-01 MED ORDER — ONDANSETRON HCL 4 MG PO TABS: 4.0000 mg | ORAL_TABLET | Freq: Three times a day (TID) | ORAL | 0 refills | Status: DC | PRN

## 2022-11-01 NOTE — Assessment & Plan Note (Signed)
Chronic issue.  Has had extensive evaluation previously for this.  Recheck exam today.  Patient will have her mammogram in December.  Encouraged trial of topical diclofenac as advised by general surgery previously.

## 2022-11-01 NOTE — Assessment & Plan Note (Signed)
Chronic issue.  Status post intervention.  She will continue to see orthopedics.

## 2022-11-01 NOTE — Patient Instructions (Signed)
Nice to see you. Neurology should contact you to schedule an appointment. Gynecology should contact you to schedule an appointment. Please try the diclofenac for your breast pain. I sent in Topamax for you to start on for your migraines. I sent in Protonix for you to start on for your stomach.

## 2022-11-01 NOTE — Assessment & Plan Note (Addendum)
Chronic issue.  Severity has improved though she is having more frequent headaches.  She has had reassuring imaging less than a year ago.  We will proceed with Topamax 25 mg daily.  She can continue Imitrex for this as well.  We are referring to neurology as well.  Patient can use Zofran as needed for nausea related to her migraines.

## 2022-11-01 NOTE — Assessment & Plan Note (Signed)
Patient with reported chronic gastric ulcer.  Most recent EGD did reveal gastric ulcer.  Will place patient on Protonix 40 mg daily to be taken 30 minutes before breakfast.  If not improving she will let us know.

## 2022-11-01 NOTE — Progress Notes (Signed)
Marikay Alar, MD Phone: [phone]  Whitney Hill is a 52 y.o. female who presents today for f/u.  Obesity: Patient notes she is eating mostly protein and some fruits.  Does eat salads.  Does not use too much dressing.  Exercise is limited by orthopedic issues.  She is on Zepbound 10 mg weekly.  Does have 2 sodas per day though the volume of what she is drinking is much less than it used to be.  Chronic breast pain: Patient reports this is generally stable.  She wants this rechecked today to make sure that we do not feel anything new.  She had 2 diagnostic mammograms last year for this that were benign.  She saw general surgery for this earlier this year who felt this issue was musculoskeletal in nature.  Mammogram is due in December.  Right ankle and knee pain: Patient had arthroplasty in her right ankle.  She had significant pain following this and then had an injection which eventually helped with the discomfort.  She reports recent injection into her right knee and notes she needs a knee replacement in that knee as well.  She has had some discomfort over the scar along her right lateral lower leg and notes the orthopedist advised her this was just scar tissue.  Migraines: Patient notes migraine headaches.  Notes she has 1 severe headache per month though is having migraines about 9 days a month.  Notes nausea with these.  She has been taking her Imitrex with good benefit.  She has been on venlafaxine with no benefit.  She has not been on any other prophylactic medications.  Gastric ulcer: Patient feels as though her gastric ulcer has flared up.  She has a history of gastric ulcer going back many years.  She reports a surgery for this in the past.  She is been taking Pepcid with some benefit.  No reflux.  She notes that aches and the discomfort mimics hunger.  She notes this is consistent with her prior ulcer symptoms.  Social History   Tobacco Use  Smoking Status Never   Passive  exposure: Current  Smokeless Tobacco Never    Current Outpatient Medications on File Prior to Visit  Medication Sig Dispense Refill   cyanocobalamin (VITAMIN B12) 1000 MCG/ML injection Inject 1 mL (1000 mcg total) into the muscle once weekly for 4 weeks and then once monthly 3 mL 3   cyclobenzaprine (FLEXERIL) 5 MG tablet Take 1 tablet (5 mg total) by mouth 3 (three) times daily as needed (muscle pain). 30 tablet 1   diclofenac sodium (VOLTAREN) 1 % GEL Apply 2 g topically 4 (four) times daily as needed (pain.).     EPINEPHrine 0.3 mg/0.3 mL IJ SOAJ injection Inject 0.3 mg into the muscle as needed for anaphylaxis. 2 each 0   Folic Acid 5 MG CAPS Take 5 mg by mouth daily.     levothyroxine (SYNTHROID) 150 MCG tablet TAKE 1 TABLET BY MOUTH DAILY BEFORE BREAKFAST. 30 tablet 2   multivitamin-iron-minerals-folic acid (CENTRUM) chewable tablet Chew 1 tablet by mouth daily.     oxyCODONE-acetaminophen (PERCOCET/ROXICET) 5-325 MG tablet Take 1 tablet by mouth every 6 (six) hours as needed. 30 tablet 0   SUMAtriptan (IMITREX) 50 MG tablet TAKE 1 TABLET BY MOUTH EVERY 2 HOURS AS NEEDED FOR MIGRAINE. MAY REPEAT IN 2 HOURS IF HEADACHE PERSISTS OR RECURS. DO NOT TAKE MORE THAN 2 DOSES IN 24 HOURS. 9 tablet 0   Syringe/Needle, Disp, (SYRINGE 3CC/25GX1") 25G  X 1" 3 ML MISC Use with b12 injections 50 each 0   tirzepatide (ZEPBOUND) 10 MG/0.5ML Pen Inject 10 mg into the skin once a week. 2 mL 1   Vitamin D, Ergocalciferol, (DRISDOL) 1.25 MG (50000 UNIT) CAPS capsule Take 1 capsule (50,000 Units total) by mouth every 7 (seven) days. (Patient taking differently: Take 50,000 Units by mouth every Thursday.) 12 capsule 1   No current facility-administered medications on file prior to visit.     ROS see history of present illness  Objective  Physical Exam Vitals:   11/01/22 1113  BP: 116/78  Pulse: 91  Temp: 98.1 F (36.7 C)  SpO2: 97%    BP Readings from Last 3 Encounters:  11/01/22 116/78   09/04/22 100/72  08/25/22 123/70   Wt Readings from Last 3 Encounters:  11/01/22 200 lb 3.2 oz (90.8 kg)  08/25/22 215 lb (97.5 kg)  07/31/22 218 lb 3.2 oz (99 kg)    Physical Exam Constitutional:      General: She is not in acute distress.    Appearance: She is not diaphoretic.  Cardiovascular:     Rate and Rhythm: Normal rate and regular rhythm.     Heart sounds: Normal heart sounds.  Pulmonary:     Effort: Pulmonary effort is normal.     Breath sounds: Normal breath sounds.  Musculoskeletal:       Feet:  Feet:     Comments: Scar noted in the area of the line above on the right side, no underlying palpable abnormalities Skin:    General: Skin is warm and dry.  Neurological:     Mental Status: She is alert.      Assessment/Plan: Please see individual problem list.  Obesity (BMI 30.0-34.9) Assessment & Plan: Chronic issue.  Continue Zepbound 10 mg weekly.  She will continue to eat a healthy diet.  Orders: -     Hemoglobin A1c  Breast pain, left Assessment & Plan: Chronic issue.  Has had extensive evaluation previously for this.  Recheck exam today.  Patient will have her mammogram in December.  Encouraged trial of topical diclofenac as advised by general surgery previously.   Chronic pain of right ankle Assessment & Plan: Chronic issue.  Status post intervention.  She will continue to see orthopedics.   Arthritis of right knee Assessment & Plan: Chronic issue.  She will continue to see orthopedics.   Low zinc level -     Zinc  Cervical cancer screening -     Ambulatory referral to Gynecology  Need for influenza vaccination -     Flu vaccine trivalent PF, 6mos and older(Flulaval,Afluria,Fluarix,Fluzone)  Other migraine without status migrainosus, not intractable Assessment & Plan: Chronic issue.  Severity has improved though she is having more frequent headaches.  She has had reassuring imaging less than a year ago.  We will proceed with Topamax 25  mg daily.  She can continue Imitrex for this as well.  We are referring to neurology as well.  Patient can use Zofran as needed for nausea related to her migraines.  Orders: -     Topiramate; Take 1 tablet (25 mg total) by mouth daily.  Dispense: 30 tablet; Refill: 2 -     Ambulatory referral to Neurology -     Ondansetron HCl; Take 1 tablet (4 mg total) by mouth every 8 (eight) hours as needed for nausea or vomiting.  Dispense: 20 tablet; Refill: 0  Chronic gastric ulcer without hemorrhage and without perforation  Assessment & Plan: Patient with reported chronic gastric ulcer.  Most recent EGD did reveal gastric ulcer.  Will place patient on Protonix 40 mg daily to be taken 30 minutes before breakfast.  If not improving she will let us know.  Orders: -     Pantoprazole Sodium; Take 1 tablet (40 mg total) by mouth daily.  Dispense: 30 tablet; Refill: 3    Return in about 3 months (around 02/01/2023) for Migraine follow-up.  I have spent 42 minutes in the care of this patient regarding history taking, documentation, completion of exam, discussion of plan, placing orders.   Marikay Alar, MD [facility] Primary [facility]

## 2022-11-01 NOTE — Assessment & Plan Note (Signed)
Chronic issue.  Continue Zepbound 10 mg weekly.  She will continue to eat a healthy diet.

## 2022-11-01 NOTE — Assessment & Plan Note (Signed)
Chronic issue.  She will continue to see orthopedics. ?

## 2022-11-03 ENCOUNTER — Other Ambulatory Visit (HOSPITAL_BASED_OUTPATIENT_CLINIC_OR_DEPARTMENT_OTHER): Payer: Self-pay

## 2022-11-09 LAB — ZINC: Zinc: 66 ug/dL (ref 60–130)

## 2022-11-17 ENCOUNTER — Encounter: Payer: Self-pay | Admitting: Neurology

## 2022-11-28 ENCOUNTER — Encounter: Payer: Self-pay | Admitting: Family Medicine

## 2022-11-28 ENCOUNTER — Other Ambulatory Visit: Payer: Self-pay | Admitting: Family Medicine

## 2022-11-28 DIAGNOSIS — G43809 Other migraine, not intractable, without status migrainosus: Secondary | ICD-10-CM

## 2022-11-29 ENCOUNTER — Other Ambulatory Visit (HOSPITAL_BASED_OUTPATIENT_CLINIC_OR_DEPARTMENT_OTHER): Payer: Self-pay

## 2022-11-29 MED ORDER — SUMATRIPTAN SUCCINATE 50 MG PO TABS
ORAL_TABLET | ORAL | 0 refills | Status: DC
  Filled 2022-11-29: qty 9, 30d supply, fill #0

## 2022-11-29 MED ORDER — ZEPBOUND 10 MG/0.5ML ~~LOC~~ SOAJ: 10.0000 mg | SUBCUTANEOUS | 1 refills | Status: DC

## 2022-12-01 MED ORDER — ONDANSETRON HCL 4 MG PO TABS: 4.0000 mg | ORAL_TABLET | Freq: Three times a day (TID) | ORAL | 0 refills | Status: DC | PRN

## 2022-12-21 ENCOUNTER — Other Ambulatory Visit: Payer: Self-pay | Admitting: Family Medicine

## 2022-12-21 DIAGNOSIS — G43809 Other migraine, not intractable, without status migrainosus: Secondary | ICD-10-CM

## 2022-12-21 MED ORDER — ONDANSETRON HCL 4 MG PO TABS: 4.0000 mg | ORAL_TABLET | Freq: Three times a day (TID) | ORAL | 0 refills | Status: DC | PRN

## 2022-12-21 MED ORDER — SUMATRIPTAN SUCCINATE 50 MG PO TABS: ORAL_TABLET | ORAL | 0 refills | Status: DC

## 2022-12-23 ENCOUNTER — Other Ambulatory Visit: Payer: Self-pay | Admitting: Family Medicine

## 2022-12-23 DIAGNOSIS — E039 Hypothyroidism, unspecified: Secondary | ICD-10-CM

## 2023-01-12 NOTE — Progress Notes (Deleted)
 Initial neurology clinic note  Whitney Hill MRN: [id number] DOB: Apr 22, 1970  Referring provider: Kent Pear, MD  Primary care provider: Kent Pear, MD  Reason for consult:  headaches  Subjective:  This is Ms. Whitney Hill, a 53 y.o. ***-handed female with a medical history of *** who presents to neurology clinic with ***. The patient is accompanied by ***.  *** Having about 9 headaches per month, one very severe Associated with nausea Imitrex  helps Never been on preventative PCP prescribed Topamax  25 mg daily on 11/01/22 Zofran  for nausea  Vitamins:  Caffeine use: Smoker: EtOH use: Restrictive diet: Family history:   MEDICATIONS:  Outpatient Encounter Medications as of 01/24/2023  Medication Sig Note   cyanocobalamin  (VITAMIN B12) 1000 MCG/ML injection Inject 1 mL (1000 mcg total) into the muscle once weekly for 4 weeks and then once monthly    cyclobenzaprine  (FLEXERIL ) 5 MG tablet Take 1 tablet (5 mg total) by mouth 3 (three) times daily as needed (muscle pain).    diclofenac  sodium (VOLTAREN ) 1 % GEL Apply 2 g topically 4 (four) times daily as needed (pain.).    EPINEPHrine  0.3 mg/0.3 mL IJ SOAJ injection Inject 0.3 mg into the muscle as needed for anaphylaxis. 08/25/2022: Has never used   Folic Acid  5 MG CAPS Take 5 mg by mouth daily. 08/18/2022: New supplement patient will start post operatively   levothyroxine  (SYNTHROID ) 150 MCG tablet TAKE 1 TABLET BY MOUTH EVERY DAY BEFORE BREAKFAST    multivitamin-iron -minerals-folic acid  (CENTRUM) chewable tablet Chew 1 tablet by mouth daily.    ondansetron  (ZOFRAN ) 4 MG tablet Take 1 tablet (4 mg total) by mouth every 8 (eight) hours as needed for nausea or vomiting.    oxyCODONE -acetaminophen  (PERCOCET/ROXICET) 5-325 MG tablet Take 1 tablet by mouth every 6 (six) hours as needed.    pantoprazole  (PROTONIX ) 40 MG tablet Take 1 tablet (40 mg total) by mouth daily.    SUMAtriptan  (IMITREX ) 50 MG tablet  TAKE 1 TABLET BY MOUTH EVERY 2 HOURS AS NEEDED FOR MIGRAINE. MAY REPEAT IN 2 HOURS IF HEADACHE PERSISTS OR RECURS. DO NOT TAKE MORE THAN 2 DOSES IN 24 HOURS.    Syringe/Needle, Disp, (SYRINGE 3CC/25GX1") 25G X 1" 3 ML MISC Use with b12 injections    tirzepatide  (ZEPBOUND ) 10 MG/0.5ML Pen Inject 10 mg into the skin once a week.    topiramate  (TOPAMAX ) 25 MG tablet Take 1 tablet (25 mg total) by mouth daily.    Vitamin D , Ergocalciferol , (DRISDOL ) 1.25 MG (50000 UNIT) CAPS capsule Take 1 capsule (50,000 Units total) by mouth every 7 (seven) days. (Patient taking differently: Take 50,000 Units by mouth every Thursday.)    No facility-administered encounter medications on file as of 01/24/2023.    PAST MEDICAL HISTORY: Past Medical History:  Diagnosis Date   Arthritis    OA   Complication of anesthesia    "ITCHING TO FACE, BENADRYL  HELP BUT DROPS BLOOD PRESSURE"   DVT (deep venous thrombosis) (HCC)    following first knee surgery in 2011   Gastric ulcer    Hashimoto's disease 09/18/2017   Headache    Heterozygous factor V Leiden mutation (HCC)    Hives    chronic   Hypothyroid    Iron  deficiency anemia    Migraine     PAST SURGICAL HISTORY: Past Surgical History:  Procedure Laterality Date   ANKLE ARTHROSCOPY Right 08/25/2022   Procedure: RIGHT ANKLE ARTHROSCOPY;  Surgeon: Timothy Ford, MD;  Location: [facility]  OR;  Service: Orthopedics;  Laterality: Right;   ANKLE FRACTURE SURGERY     1994, 1996   CHOLECYSTECTOMY     1998   COLONOSCOPY WITH PROPOFOL  N/A 06/14/2018   Procedure: COLONOSCOPY WITH BIOPSY;  Surgeon: Marnee Sink, MD;  Location: [facility];  Service: Endoscopy;  Laterality: N/A;   DILATION AND CURETTAGE OF UTERUS     1994, 2001   ESOPHAGOGASTRODUODENOSCOPY (EGD) WITH PROPOFOL  N/A 06/14/2018   Procedure: ESOPHAGOGASTRODUODENOSCOPY (EGD) WITH PROPOFOL ;  Surgeon: Marnee Sink, MD;  Location: [facility];  Service: Endoscopy;  Laterality: N/A;   FOREIGN BODY  REMOVAL N/A 06/14/2018   Procedure: FOREIGN BODY REMOVAL;  Surgeon: Marnee Sink, MD;  Location: [facility];  Service: Endoscopy;  Laterality: N/A;  Staple Removal from Stomach   GASTRIC BYPASS     2012, revision 2013   KNEE ARTHROSCOPY     2011, 2013, 2015   KNEE SURGERY Right 09/21/2021   POLYPECTOMY N/A 06/14/2018   Procedure: POLYPECTOMY;  Surgeon: Marnee Sink, MD;  Location: [facility];  Service: Endoscopy;  Laterality: N/A;   TOOTH EXTRACTION     2010   TOTAL KNEE ARTHROPLASTY Left 05/25/2015   Procedure: LEFT TOTAL KNEE ARTHROPLASTY;  Surgeon: Saundra Curl, MD;  Location: [facility] OR;  Service: Orthopedics;  Laterality: Left;   TUBAL LIGATION     2004    ALLERGIES: Allergies  Allergen Reactions   Paxil [Paroxetine Hcl] Anaphylaxis    Throat swelled shut   Penicillins Hives    Had cefazolin  2017, 2024    FAMILY HISTORY: Family History  Problem Relation Age of Onset   Stroke Mother    Heart attack Mother    AAA (abdominal aortic aneurysm) Mother    Kidney cancer Mother    Atrial fibrillation Mother    Diabetes Father    Dementia Father    Breast cancer Sister 43   Leukemia Sister    Breast cancer Maternal Grandmother        late 50's   Breast cancer Maternal Aunt 16       BRACA + dx twice age 65 and 91   Arthritis Other        parent   Hyperlipidemia Other        parent   Stroke Other        parent   Hypertension Other        parent   Kidney disease Other        parent, grandparent   Diabetes Other        parent, grandparent   Renal cancer Other        parent    SOCIAL HISTORY: Social History   Tobacco Use   Smoking status: Never    Passive exposure: Current   Smokeless tobacco: Never  Vaping Use   Vaping status: Never Used  Substance Use Topics   Alcohol use: No    Alcohol/week: 0.0 standard drinks of alcohol   Drug use: No   Social History   Social History Narrative   Not on file    Objective:  Vital Signs:  LMP  08/24/2018 (Exact Date)   ***  Labs and Imaging review: Internal labs: 11/01/22: Zinc  wnl HbA1c: 5.5  07/31/22: CMET unremarkable Lipid panel: tChol 179, LDL 96, TG 170 Vit D low at 14.52 TSH wnl B1 wnl B12: 300 CBC significant for MCV of 98.2 Ferritin 67.1 Vit A wnl Vit E wnl Folate low at 2.1 Copper  wnl  05/19/22: CRP wnl ESR wnl dsDNA neg SSA, SSB neg Anti-SM ab neg RNP ab neg  12/08/21: CCP neg RF neg CK: 60  External labs: ***  Imaging: MRI brain wo contrast (12/16/21): FINDINGS: Brain: No restricted diffusion to suggest acute or subacute infarct.No acute hemorrhage, mass, mass effect, or midline shift. No hydrocephalus or extra-axial collection.No hemosiderin deposition to suggest remote hemorrhage.   Vascular: Patent arterial flow voids.   Skull and upper cervical spine: Normal marrow signal.   Sinuses/Orbits: Minimal mucosal thickening in the right maxillary sinus. Otherwise clear paranasal sinuses. The orbits are unremarkable.   Other: The mastoid air cells are well aerated.   IMPRESSION: No acute intracranial process. No etiology is seen for the patient's headaches.  MRI cervical spine wo contrast (01/18/18): FINDINGS: Alignment: Straightening of the normal cervical lordosis. No listhesis or malalignment.   Vertebrae: Vertebral body heights well maintained without evidence for acute or chronic fracture. Bone marrow signal intensity within normal limits. No discrete or worrisome osseous lesions. No abnormal marrow edema.   Cord: Signal intensity within the cervical spinal cord is normal. No cord signal abnormality to suggest demyelinating disease. Normal cord caliber and morphology.   Posterior Fossa, vertebral arteries, paraspinal tissues: Visualized brain within normal limits. Craniocervical junction normal. Paraspinous and prevertebral soft tissues within normal limits. Normal intravascular flow voids seen within the vertebral  arteries bilaterally.   Disc levels:   C2-C3: Unremarkable.   C3-C4:  Unremarkable.   C4-C5:  Unremarkable.   C5-C6:  Unremarkable.   C6-C7: Mild annular disc bulge. No significant canal or foraminal stenosis.   C7-T1:  Unremarkable.   Visualized upper thoracic spine is unremarkable.   IMPRESSION: 1. Normal MRI appearance of the cervical spinal cord. No findings to suggest demyelinating disease or other abnormality. 2. Minor noncompressive disc bulging at C6-7 without stenosis or impingement. Otherwise unremarkable MRI of the cervical spine.  EMG of LUE (10/04/2018 by Dr. Walden Guise): Impression: Normal study. There is no electrodiagnostic evidence of a large fiber neuropathy or cervical radiculopathy in the left upper extremity.  ***  Assessment/Plan:  MITALI SHENEFIELD is a 53 y.o. female who presents for evaluation of ***. *** has a relevant medical history of ***. *** neurological examination is pertinent for ***. Available diagnostic data is significant for ***. This constellation of symptoms and objective data would most likely localize to ***. ***  PLAN: -Blood work: *** ***vit D and B12 supplementatiom?  -Return to clinic ***  The impression above as well as the plan as outlined below were extensively discussed with the patient (in the company of ***) who voiced understanding. All questions were answered to their satisfaction.  The patient was counseled on pertinent fall precautions per the printed material provided today, and as noted under the "Patient Instructions" section below.***  When available, results of the above investigations and possible further recommendations will be communicated to the patient via telephone/MyChart. Patient to call office if not contacted after expected testing turnaround time.   Total time spent reviewing records, interview, history/exam, documentation, and coordination of care on day of encounter:  *** min   Thank you for  allowing me to participate in patient's care.  If I can answer any additional questions, I would be pleased to do so.  Rommie Coats, MD   CC: Kent Pear, MD [address] [place] [place] [address]  CC: Referring provider: Kent Pear, MD [address] [place],  [place] [address]

## 2023-01-13 ENCOUNTER — Encounter: Payer: Self-pay | Admitting: Internal Medicine

## 2023-01-15 ENCOUNTER — Telehealth: Payer: Self-pay | Admitting: Family Medicine

## 2023-01-15 NOTE — Telephone Encounter (Signed)
Lft pt vm to call ofc regarding referral. Thanks

## 2023-01-17 ENCOUNTER — Telehealth: Payer: Self-pay

## 2023-01-17 NOTE — Telephone Encounter (Signed)
 Scheduled new patient appointment for patient.

## 2023-01-23 ENCOUNTER — Other Ambulatory Visit: Payer: Self-pay | Admitting: Family Medicine

## 2023-01-23 DIAGNOSIS — G43809 Other migraine, not intractable, without status migrainosus: Secondary | ICD-10-CM

## 2023-01-24 ENCOUNTER — Ambulatory Visit: Payer: Managed Care, Other (non HMO) | Admitting: Neurology

## 2023-01-30 ENCOUNTER — Ambulatory Visit: Payer: Commercial Managed Care - HMO | Admitting: Orthopedic Surgery

## 2023-02-02 ENCOUNTER — Ambulatory Visit (INDEPENDENT_AMBULATORY_CARE_PROVIDER_SITE_OTHER): Payer: Commercial Managed Care - HMO | Admitting: Family Medicine

## 2023-02-02 ENCOUNTER — Encounter: Payer: Self-pay | Admitting: Family Medicine

## 2023-02-02 VITALS — BP 100/72 | HR 82 | Temp 98.0°F | Resp 18 | Ht 65.0 in | Wt 187.2 lb

## 2023-02-02 DIAGNOSIS — E039 Hypothyroidism, unspecified: Secondary | ICD-10-CM | POA: Diagnosis not present

## 2023-02-02 DIAGNOSIS — E66811 Obesity, class 1: Secondary | ICD-10-CM

## 2023-02-02 DIAGNOSIS — B372 Candidiasis of skin and nail: Secondary | ICD-10-CM | POA: Insufficient documentation

## 2023-02-02 DIAGNOSIS — G43009 Migraine without aura, not intractable, without status migrainosus: Secondary | ICD-10-CM

## 2023-02-02 DIAGNOSIS — M722 Plantar fascial fibromatosis: Secondary | ICD-10-CM | POA: Insufficient documentation

## 2023-02-02 LAB — TSH: TSH: 3.46 u[IU]/mL (ref 0.35–5.50)

## 2023-02-02 MED ORDER — NYSTATIN 100000 UNIT/GM EX POWD: 1.0000 | Freq: Three times a day (TID) | CUTANEOUS | 1 refills | Status: DC | PRN

## 2023-02-02 MED ORDER — ZEPBOUND 10 MG/0.5ML ~~LOC~~ SOAJ: 10.0000 mg | SUBCUTANEOUS | 2 refills | Status: DC

## 2023-02-02 NOTE — Progress Notes (Signed)
Marikay Alar, MD Phone: [phone]  Whitney Hill is a 53 y.o. female who presents today for f/u.  Obesity: Patient notes she is not able to exercise given orthopedic issues.  She is eating small meals.  Zepbound has worked well for her and she continues to lose weight.  She notes no side effects with this.  Foot and ankle pain: Patient notes she has plantar fasciitis.  Notes discomfort in the bottom of her feet when she wakes up in the morning.  She was supposed see orthopedics earlier this week that she had a cold and could not go.  Hypothyroidism: Taking Synthroid.  No skin changes.  No heat or cold intolerance.  Migraines: Patient notes these had slowed down until the last couple of days.  She had to reschedule her neurology visit.  Candidal intertrigo: Patient notes red itchy skin in the folds of her abdomen that occurs periodically when she is more active.  Previously treated with nystatin powder.  Social History   Tobacco Use  Smoking Status Never   Passive exposure: Current  Smokeless Tobacco Never    Current Outpatient Medications on File Prior to Visit  Medication Sig Dispense Refill   cyanocobalamin (VITAMIN B12) 1000 MCG/ML injection Inject 1 mL (1000 mcg total) into the muscle once weekly for 4 weeks and then once monthly 3 mL 3   cyclobenzaprine (FLEXERIL) 5 MG tablet Take 1 tablet (5 mg total) by mouth 3 (three) times daily as needed (muscle pain). 30 tablet 1   diclofenac sodium (VOLTAREN) 1 % GEL Apply 2 g topically 4 (four) times daily as needed (pain.).     EPINEPHrine 0.3 mg/0.3 mL IJ SOAJ injection Inject 0.3 mg into the muscle as needed for anaphylaxis. 2 each 0   Folic Acid 5 MG CAPS Take 5 mg by mouth daily.     levothyroxine (SYNTHROID) 150 MCG tablet TAKE 1 TABLET BY MOUTH EVERY DAY BEFORE BREAKFAST 30 tablet 2   multivitamin-iron-minerals-folic acid (CENTRUM) chewable tablet Chew 1 tablet by mouth daily.     ondansetron (ZOFRAN) 4 MG tablet TAKE  1 TABLET BY MOUTH EVERY 8 HOURS AS NEEDED FOR NAUSEA AND VOMITING 20 tablet 0   pantoprazole (PROTONIX) 40 MG tablet Take 1 tablet (40 mg total) by mouth daily. 30 tablet 3   SUMAtriptan (IMITREX) 50 MG tablet TAKE 1 TABLET BY MOUTH EVERY 2 HOURS AS NEEDED FOR MIGRAINE. MAY REPEAT IN 2 HOURS IF HEADACHE PERSISTS OR RECURS. DO NOT TAKE MORE THAN 2 DOSES IN 24 HOURS. 9 tablet 0   Syringe/Needle, Disp, (SYRINGE 3CC/25GX1") 25G X 1" 3 ML MISC Use with b12 injections 50 each 0   Vitamin D, Ergocalciferol, (DRISDOL) 1.25 MG (50000 UNIT) CAPS capsule Take 1 capsule (50,000 Units total) by mouth every 7 (seven) days. (Patient taking differently: Take 50,000 Units by mouth every Thursday.) 12 capsule 1   No current facility-administered medications on file prior to visit.     ROS see history of present illness  Objective  Physical Exam Vitals:   02/02/23 1139  BP: 100/72  Pulse: 82  Resp: 18  Temp: 98 F (36.7 C)  SpO2: 98%    BP Readings from Last 3 Encounters:  02/02/23 100/72  11/01/22 116/78  09/04/22 100/72   Wt Readings from Last 3 Encounters:  02/02/23 187 lb 4 oz (84.9 kg)  11/01/22 200 lb 3.2 oz (90.8 kg)  08/25/22 215 lb (97.5 kg)    Physical Exam Constitutional:  General: She is not in acute distress.    Appearance: She is not diaphoretic.  Cardiovascular:     Rate and Rhythm: Normal rate and regular rhythm.     Heart sounds: Normal heart sounds.  Pulmonary:     Effort: Pulmonary effort is normal.     Breath sounds: Normal breath sounds.  Skin:    General: Skin is warm and dry.     Comments: Small patch of likely candidal intertrigo under her abdominal skin folds  Neurological:     Mental Status: She is alert.      Assessment/Plan: Please see individual problem list.  Migraine without aura and without status migrainosus, not intractable Assessment & Plan: Chronic issue.  Patient can continue Imitrex as needed.  She will see neurology as  planned.   Hypothyroidism, unspecified type Assessment & Plan: Chronic issue.  Check TSH.  For now continue Synthroid 150 mcg daily.  Orders: -     TSH  Candidal intertrigo Assessment & Plan: Nystatin powder sent to pharmacy for her to use as needed.  Orders: -     Nystatin; Apply 1 Application topically 3 (three) times daily as needed (candidal intertrigo).  Dispense: 60 g; Refill: 1  Obesity (BMI 30.0-34.9) Assessment & Plan: Chronic issue.  Weight continues to trend down adequately.  She will continue Zepbound 10 mg weekly.  Refill sent to pharmacy.  Continue healthy diet.  Orders: -     Zepbound; Inject 10 mg into the skin once a week.  Dispense: 2 mL; Refill: 2  Plantar fasciitis Assessment & Plan: Patient will see orthopedics as planned.  Discussed icing and stretching.     Return in about 3 months (around 05/03/2023) for with [facility].   Marikay Alar, MD [facility] Primary Care [facility]

## 2023-02-02 NOTE — Assessment & Plan Note (Signed)
Chronic issue.  Patient can continue Imitrex as needed.  She will see neurology as planned.

## 2023-02-02 NOTE — Assessment & Plan Note (Signed)
Chronic issue.  Check TSH.  For now continue Synthroid 150 mcg daily.

## 2023-02-02 NOTE — Assessment & Plan Note (Signed)
Patient will see orthopedics as planned.  Discussed icing and stretching.

## 2023-02-02 NOTE — Assessment & Plan Note (Signed)
Nystatin powder sent to pharmacy for her to use as needed.

## 2023-02-02 NOTE — Assessment & Plan Note (Signed)
Chronic issue.  Weight continues to trend down adequately.  She will continue Zepbound 10 mg weekly.  Refill sent to pharmacy.  Continue healthy diet.

## 2023-02-12 NOTE — Progress Notes (Signed)
 Initial neurology clinic note  Whitney Hill MRN: [id number] DOB: 09-16-1970  Referring provider: Maribeth Camellia MATSU, MD  Primary care provider: Maribeth Camellia MATSU, MD  Reason for consult:  headaches  Subjective:  This is Ms. Whitney Hill, a 53 y.o. right-handed female with a medical history of hypothyroidism, migraines, iron  deficiency anemia, OA, gastric bypass surgery, thiamine  deficiency, B12 deficiency, vit D deficiency, folate deficiency who presents to neurology clinic with headaches. The patient is alone today.  Patient has a chronic headache for many years. It became more severe about 2 years ago. She would mainly have one when waking up. It is usually right sided around the eye and back of the head, but can be on the left side. It was occurred every day and would last all day. She took tylenol , but this did nothing. Her daughter has bad migraines and gave her a Imitrex  100 mg and this got rid of the headache. She endorses photophobia, phonophobia, and nausea, but no vomiting. She denies any recent vision changes or vision loss.  She would get up and move around and sometimes headache would improve. She can now get a headache at any time. She does not think laying down makes her headache worse. She does not think she snores. She feels well rested in the morning. She is not tired throughout the day.  She then spoke with her PCP. She was started on Imitrex  50 mg PRN about 2 years ago. This helped, but she was having to take all 9 every month. She does not usually take at headache They improved but increased in frequency about 3-4 months ago. She was started on Topamax  25 mg daily on 11/01/22 by PCP. Patient took it about 3 weeks but stopped because it made all food and drink taste horrible. Taste returned to normal after stopping it.   Her current headaches are 7/10 but were 10/10 about 2 years ago. She will have 2-3 headaches per week currently.  Current  medications: -Imitrex  50 mg PRN - almost always helps, maybe not always completely resolved though -Zofran  PRN for nausea  Past medications:  -Topamax  - stopped due to abnormal taste  Caffeine use: 2 sodas per day Smoker: Never EtOH use: None Restrictive diet: No Family history of neurologic disease: Daughter and son with migraines  Of note, patient has seen neurology in the past for weakness and vision changes and found to have B1 deficiency. She also gets B12 injections.  Of note, patient has had 12 ankle or leg surgeries due to fractures and OA.  MEDICATIONS:  Outpatient Encounter Medications as of 02/14/2023  Medication Sig Note   cyanocobalamin  (VITAMIN B12) 1000 MCG/ML injection Inject 1 mL (1000 mcg total) into the muscle once weekly for 4 weeks and then once monthly    cyclobenzaprine  (FLEXERIL ) 5 MG tablet Take 1 tablet (5 mg total) by mouth 3 (three) times daily as needed (muscle pain).    diclofenac  sodium (VOLTAREN ) 1 % GEL Apply 2 g topically 4 (four) times daily as needed (pain.).    EPINEPHrine  0.3 mg/0.3 mL IJ SOAJ injection Inject 0.3 mg into the muscle as needed for anaphylaxis. 08/25/2022: Has never used   Folic Acid  5 MG CAPS Take 5 mg by mouth daily. 08/18/2022: New supplement patient will start post operatively   levothyroxine  (SYNTHROID ) 150 MCG tablet TAKE 1 TABLET BY MOUTH EVERY DAY BEFORE BREAKFAST    multivitamin-iron -minerals-folic acid  (CENTRUM) chewable tablet Chew 1 tablet by mouth daily.  nortriptyline  (PAMELOR ) 10 MG capsule Take 1 capsule (10 mg) at bedtime for 1 week, then increase to 2 capsules (20 mg) at bedtime thereafter    nystatin  (MYCOSTATIN /NYSTOP ) powder Apply 1 Application topically 3 (three) times daily as needed (candidal intertrigo).    pantoprazole  (PROTONIX ) 40 MG tablet Take 1 tablet (40 mg total) by mouth daily.    Syringe/Needle, Disp, (SYRINGE 3CC/25GX1) 25G X 1 3 ML MISC Use with b12 injections    tirzepatide  (ZEPBOUND ) 10 MG/0.5ML  Pen Inject 10 mg into the skin once a week.    Vitamin D , Ergocalciferol , (DRISDOL ) 1.25 MG (50000 UNIT) CAPS capsule Take 1 capsule (50,000 Units total) by mouth every 7 (seven) days. (Patient taking differently: Take 50,000 Units by mouth every Thursday.)    [DISCONTINUED] ondansetron  (ZOFRAN ) 4 MG tablet TAKE 1 TABLET BY MOUTH EVERY 8 HOURS AS NEEDED FOR NAUSEA AND VOMITING    [DISCONTINUED] SUMAtriptan  (IMITREX ) 50 MG tablet TAKE 1 TABLET BY MOUTH EVERY 2 HOURS AS NEEDED FOR MIGRAINE. MAY REPEAT IN 2 HOURS IF HEADACHE PERSISTS OR RECURS. DO NOT TAKE MORE THAN 2 DOSES IN 24 HOURS.    ondansetron  (ZOFRAN ) 4 MG tablet Take 1 tablet (4 mg total) by mouth as needed for nausea or vomiting.    SUMAtriptan  (IMITREX ) 100 MG tablet Take 1 tablet (100 mg total) by mouth every 2 (two) hours as needed for migraine. Max 2 tablets in 24 hours.    No facility-administered encounter medications on file as of 02/14/2023.    PAST MEDICAL HISTORY: Past Medical History:  Diagnosis Date   Arthritis    OA   Complication of anesthesia    ITCHING TO FACE, BENADRYL  HELP BUT DROPS BLOOD PRESSURE   DVT (deep venous thrombosis) (HCC)    following first knee surgery in 2011   Gastric ulcer    Hashimoto's disease 09/18/2017   Headache    Heterozygous factor V Leiden mutation (HCC)    Hives    chronic   Hypothyroid    Iron  deficiency anemia    Migraine     PAST SURGICAL HISTORY: Past Surgical History:  Procedure Laterality Date   ANKLE ARTHROSCOPY Right 08/25/2022   Procedure: RIGHT ANKLE ARTHROSCOPY;  Surgeon: Harden Jerona GAILS, MD;  Location: [facility] OR;  Service: Orthopedics;  Laterality: Right;   ANKLE FRACTURE SURGERY     1994, 1996   CHOLECYSTECTOMY     1998   COLONOSCOPY WITH PROPOFOL  N/A 06/14/2018   Procedure: COLONOSCOPY WITH BIOPSY;  Surgeon: Jinny Carmine, MD;  Location: [facility];  Service: Endoscopy;  Laterality: N/A;   DILATION AND CURETTAGE OF UTERUS     1994, 2001    ESOPHAGOGASTRODUODENOSCOPY (EGD) WITH PROPOFOL  N/A 06/14/2018   Procedure: ESOPHAGOGASTRODUODENOSCOPY (EGD) WITH PROPOFOL ;  Surgeon: Jinny Carmine, MD;  Location: [facility];  Service: Endoscopy;  Laterality: N/A;   FOREIGN BODY REMOVAL N/A 06/14/2018   Procedure: FOREIGN BODY REMOVAL;  Surgeon: Jinny Carmine, MD;  Location: [facility];  Service: Endoscopy;  Laterality: N/A;  Staple Removal from Stomach   GASTRIC BYPASS     2012, revision 2013   KNEE ARTHROSCOPY     2011, 2013, 2015   KNEE SURGERY Right 09/21/2021   POLYPECTOMY N/A 06/14/2018   Procedure: POLYPECTOMY;  Surgeon: Jinny Carmine, MD;  Location: [facility];  Service: Endoscopy;  Laterality: N/A;   TOOTH EXTRACTION     2010   TOTAL KNEE ARTHROPLASTY Left 05/25/2015   Procedure: LEFT TOTAL KNEE ARTHROPLASTY;  Surgeon:  Evalene JONETTA Chancy, MD;  Location: [facility] OR;  Service: Orthopedics;  Laterality: Left;   TUBAL LIGATION     2004    ALLERGIES: Allergies  Allergen Reactions   Paxil [Paroxetine Hcl] Anaphylaxis    Throat swelled shut   Penicillins Hives    Had cefazolin  2017, 2024    FAMILY HISTORY: Family History  Problem Relation Age of Onset   Stroke Mother    Heart attack Mother    AAA (abdominal aortic aneurysm) Mother    Kidney cancer Mother    Atrial fibrillation Mother    Diabetes Father    Dementia Father    Breast cancer Sister 51   Leukemia Sister    Breast cancer Maternal Grandmother        late 50's   Breast cancer Maternal Aunt 41       BRACA + dx twice age 56 and 38   Arthritis Other        parent   Hyperlipidemia Other        parent   Stroke Other        parent   Hypertension Other        parent   Kidney disease Other        parent, grandparent   Diabetes Other        parent, grandparent   Renal cancer Other        parent    SOCIAL HISTORY: Social History   Tobacco Use   Smoking status: Never    Passive exposure: Current   Smokeless tobacco: Never  Vaping Use    Vaping status: Never Used  Substance Use Topics   Alcohol use: No    Alcohol/week: 0.0 standard drinks of alcohol   Drug use: No   Social History   Social History Narrative   Are you right handed or left handed? right   Are you currently employed ? no   What is your current occupation?   Do you live at home alone?   Who lives with you? family   What type of home do you live in: 1 story or 2 story? two   Caffiene 2 soda a day     Objective:  Vital Signs:  BP 109/73   Pulse 74   Ht 5' 5 (1.651 m)   Wt 190 lb (86.2 kg)   LMP 08/24/2018 (Exact Date)   SpO2 97%   BMI 31.62 kg/m   General: No acute distress.  Patient appears well-groomed.   Head:  Normocephalic/atraumatic Eyes:  fundi examined, disc margins clear, no obvious papilledema Neck: supple Heart: regular rate and rhythm Lungs: Clear to auscultation bilaterally. Vascular: No carotid bruits.  Neurological Exam: Mental status: alert and oriented, speech fluent and not dysarthric, language intact.  Cranial nerves: CN I: not tested CN II: pupils equal, round and reactive to light, visual fields intact CN III, IV, VI:  full range of motion, no nystagmus, no ptosis CN V: facial sensation intact. CN VII: upper and lower face symmetric CN VIII: hearing intact CN IX, X: uvula midline CN XI: sternocleidomastoid and trapezius muscles intact CN XII: tongue midline  Bulk & Tone: normal. Motor:  muscle strength 5/5 throughout Deep Tendon Reflexes:  2+ throughout.   Sensation:  Pinprick, vibratory, and proprioceptive sensation intact. Finger to nose testing:  Without dysmetria.   Gait:  Narrow based, antalgic gait.  Labs and Imaging review: Internal labs: TSH (02/02/23): wnl  11/01/22: Zinc  wnl HbA1c: 5.5  07/31/22: CMET  unremarkable Lipid panel: tChol 179, LDL 96, TG 170 Vit D low at 14.52 TSH wnl B1 wnl B12: 300 CBC significant for MCV of 98.2 Ferritin 67.1 Vit A wnl Vit E wnl Folate low at  2.1 Copper  wnl  05/19/22: CRP wnl ESR wnl dsDNA neg SSA, SSB neg Anti-SM ab neg RNP ab neg  12/08/21: CCP neg RF neg CK: 60  Imaging: MRI brain wo contrast (12/16/21): FINDINGS: Brain: No restricted diffusion to suggest acute or subacute infarct.No acute hemorrhage, mass, mass effect, or midline shift. No hydrocephalus or extra-axial collection.No hemosiderin deposition to suggest remote hemorrhage.   Vascular: Patent arterial flow voids.   Skull and upper cervical spine: Normal marrow signal.   Sinuses/Orbits: Minimal mucosal thickening in the right maxillary sinus. Otherwise clear paranasal sinuses. The orbits are unremarkable.   Other: The mastoid air cells are well aerated.   IMPRESSION: No acute intracranial process. No etiology is seen for the patient's headaches.  MRI cervical spine wo contrast (01/18/18): FINDINGS: Alignment: Straightening of the normal cervical lordosis. No listhesis or malalignment.   Vertebrae: Vertebral body heights well maintained without evidence for acute or chronic fracture. Bone marrow signal intensity within normal limits. No discrete or worrisome osseous lesions. No abnormal marrow edema.   Cord: Signal intensity within the cervical spinal cord is normal. No cord signal abnormality to suggest demyelinating disease. Normal cord caliber and morphology.   Posterior Fossa, vertebral arteries, paraspinal tissues: Visualized brain within normal limits. Craniocervical junction normal. Paraspinous and prevertebral soft tissues within normal limits. Normal intravascular flow voids seen within the vertebral arteries bilaterally.   Disc levels:   C2-C3: Unremarkable.   C3-C4:  Unremarkable.   C4-C5:  Unremarkable.   C5-C6:  Unremarkable.   C6-C7: Mild annular disc bulge. No significant canal or foraminal stenosis.   C7-T1:  Unremarkable.   Visualized upper thoracic spine is unremarkable.   IMPRESSION: 1. Normal MRI  appearance of the cervical spinal cord. No findings to suggest demyelinating disease or other abnormality. 2. Minor noncompressive disc bulging at C6-7 without stenosis or impingement. Otherwise unremarkable MRI of the cervical spine.  EMG of LUE (10/04/2018 by Dr. Lane): Impression: Normal study. There is no electrodiagnostic evidence of a large fiber neuropathy or cervical radiculopathy in the left upper extremity.   Assessment/Plan:  Whitney Hill is a 53 y.o. female who presents for evaluation of headaches. She has a relevant medical history of hypothyroidism, migraines, iron  deficiency anemia, OA, gastric bypass surgery, thiamine  deficiency, B12 deficiency, vit D deficiency, folate deficiency. Her neurological examination is essentially normal today. Patient's symptoms are most consistent with episodic migraine without aura. She has 8-12 migraines per month currently. She has previously taken Topamax , but was unable to tolerate it (metallic taste).  PLAN: -Continue folate, vit D, and B12 supplementation -For migraines: Migraine prevention: Nortriptyline  10 mg at bedtime for 1 week then increase to 20 mg at bedtime Migraine rescue:  Sumatriptan  100 mg as needed at headache onset, can repeat in 2 hours if needed. Zofran  as needed for nausea Limit use of pain relievers to no more than 2 days out of week to prevent risk of rebound or medication-overuse headache. Keep headache diary  -Return to clinic in 3 months  The impression above as well as the plan as outlined below were extensively discussed with the patient who voiced understanding. All questions were answered to their satisfaction.  When available, results of the above investigations and possible further recommendations will be communicated to the  patient via telephone/MyChart. Patient to call office if not contacted after expected testing turnaround time.   Total time spent reviewing records, interview, history/exam,  documentation, and coordination of care on day of encounter:  50 min   Thank you for allowing me to participate in patient's care.  If I can answer any additional questions, I would be pleased to do so.  Venetia Potters, MD   CC: Maribeth Camellia MATSU, MD [address] [place] [address]  CC: Referring provider: Maribeth Camellia MATSU, MD [address] [place],  [place] [address]

## 2023-02-14 ENCOUNTER — Encounter: Payer: Self-pay | Admitting: Neurology

## 2023-02-14 ENCOUNTER — Other Ambulatory Visit (HOSPITAL_BASED_OUTPATIENT_CLINIC_OR_DEPARTMENT_OTHER): Payer: Self-pay

## 2023-02-14 ENCOUNTER — Other Ambulatory Visit: Payer: Self-pay

## 2023-02-14 ENCOUNTER — Ambulatory Visit: Payer: Commercial Managed Care - HMO | Admitting: Neurology

## 2023-02-14 VITALS — BP 109/73 | HR 74 | Ht 65.0 in | Wt 190.0 lb

## 2023-02-14 DIAGNOSIS — E519 Thiamine deficiency, unspecified: Secondary | ICD-10-CM | POA: Diagnosis not present

## 2023-02-14 DIAGNOSIS — R110 Nausea: Secondary | ICD-10-CM

## 2023-02-14 DIAGNOSIS — H53149 Visual discomfort, unspecified: Secondary | ICD-10-CM

## 2023-02-14 DIAGNOSIS — G43009 Migraine without aura, not intractable, without status migrainosus: Secondary | ICD-10-CM

## 2023-02-14 DIAGNOSIS — E538 Deficiency of other specified B group vitamins: Secondary | ICD-10-CM

## 2023-02-14 DIAGNOSIS — F40298 Other specified phobia: Secondary | ICD-10-CM

## 2023-02-14 DIAGNOSIS — E559 Vitamin D deficiency, unspecified: Secondary | ICD-10-CM

## 2023-02-14 DIAGNOSIS — G43809 Other migraine, not intractable, without status migrainosus: Secondary | ICD-10-CM

## 2023-02-14 MED ORDER — ONDANSETRON HCL 4 MG PO TABS
4.0000 mg | ORAL_TABLET | Freq: Three times a day (TID) | ORAL | 3 refills | Status: DC | PRN
  Filled 2023-02-14: qty 20, 7d supply, fill #0
  Filled 2023-04-03: qty 20, 7d supply, fill #1
  Filled 2023-04-19 – 2023-04-20 (×2): qty 20, 7d supply, fill #2
  Filled 2023-05-16: qty 20, 7d supply, fill #3

## 2023-02-14 MED ORDER — SUMATRIPTAN SUCCINATE 100 MG PO TABS
100.0000 mg | ORAL_TABLET | ORAL | 5 refills | Status: DC | PRN
  Filled 2023-02-14 – 2023-02-15 (×2): qty 9, 30d supply, fill #0
  Filled 2023-02-20: qty 27, 90d supply, fill #1
  Filled 2023-04-03: qty 24, 90d supply, fill #2
  Filled 2023-04-19 – 2023-04-20 (×2): qty 18, 60d supply, fill #2
  Filled 2023-05-16 – 2023-06-25 (×2): qty 18, 60d supply, fill #3
  Filled 2023-06-26: qty 6, 1d supply, fill #3

## 2023-02-14 MED ORDER — NORTRIPTYLINE HCL 10 MG PO CAPS
20.0000 mg | ORAL_CAPSULE | Freq: Every day | ORAL | 5 refills | Status: DC
  Filled 2023-02-14: qty 60, 33d supply, fill #0
  Filled 2023-04-03: qty 60, 33d supply, fill #1
  Filled 2023-04-19 – 2023-05-04 (×2): qty 60, 33d supply, fill #2
  Filled 2023-06-04 – 2023-06-29 (×3): qty 60, 33d supply, fill #3

## 2023-02-14 NOTE — Patient Instructions (Addendum)
-  Continue folate, vit D, and B12 supplementation -For migraines: Migraine prevention: Nortriptyline  10 mg at bedtime for 1 week then increase to 20 mg at bedtime Migraine rescue:  Sumatriptan  100 mg as needed at headache onset, can repeat in 2 hours if needed. Zofran  as needed for nausea Limit use of pain relievers to no more than 2 days out of week to prevent risk of rebound or medication-overuse headache. Keep headache diary  -Return to clinic in 3 months  The physicians and staff at [facility] are committed to providing excellent care. You may receive a survey requesting feedback about your experience at our office. We strive to receive very good responses to the survey questions. If you feel that your experience would prevent you from giving the office a very good  response, please contact our office to try to remedy the situation. We may be reached at [phone]. Thank you for taking the time out of your busy day to complete the survey.  Venetia Potters, MD [facility]  More migraine information: Be aware of common food triggers:  - Caffeine:  coffee, black tea, cola, Mt. Dew  - Chocolate  - Dairy:  aged cheeses (brie, blue, cheddar, gouda, Parmasan, provolone, romano, Swiss, etc), chocolate milk, buttermilk, sour cream, limit eggs and yogurt  - Nuts, peanut butter  - Alcohol  - Cereals/grains:  FRESH breads (fresh bagels, sourdough, doughnuts), yeast productions  - Processed/canned/aged/cured meats (pre-packaged deli meats, hotdogs)  - MSG/glutamate:  soy sauce, flavor enhancer, pickled/preserved/marinated foods  - Sweeteners:  aspartame (Equal, Nutrasweet).  Sugar and Splenda are okay  - Vegetables:  legumes (lima beans, lentils, snow peas, fava beans, pinto peans, peas, garbanzo beans), sauerkraut, onions, olives, pickles  - Fruit:  avocados, bananas, citrus fruit (orange, lemon, grapefruit), mango  - Other:  Frozen meals, macaroni and cheese Routine exercise Stay  adequately hydrated (aim for 64 oz water  daily) Keep headache diary Maintain proper stress management Maintain proper sleep hygiene Do not skip meals Consider supplements:  magnesium  citrate 400mg  daily, riboflavin 400mg  daily, coenzyme Q10 100mg  three times daily.

## 2023-02-15 ENCOUNTER — Other Ambulatory Visit (HOSPITAL_BASED_OUTPATIENT_CLINIC_OR_DEPARTMENT_OTHER): Payer: Self-pay

## 2023-02-15 ENCOUNTER — Ambulatory Visit: Payer: Commercial Managed Care - HMO | Admitting: Orthopedic Surgery

## 2023-02-15 DIAGNOSIS — M722 Plantar fascial fibromatosis: Secondary | ICD-10-CM

## 2023-02-19 ENCOUNTER — Encounter: Payer: Self-pay | Admitting: Orthopedic Surgery

## 2023-02-19 ENCOUNTER — Ambulatory Visit: Payer: Commercial Managed Care - HMO | Admitting: Orthopedic Surgery

## 2023-02-19 NOTE — Progress Notes (Signed)
 Office Visit Note   Patient: Whitney Hill           Date of Birth: January 17, 1970           MRN: [id number] Visit Date: 02/15/2023              Requested by: Maribeth Camellia MATSU, MD [address] [place],  [place] [address] PCP: Maribeth Camellia MATSU, MD  Chief Complaint  Patient presents with   Right Ankle - Follow-up    08/25/22 right ankle scope       HPI: Patient is a 53 year old woman who is seen for follow-up she is status post right ankle injection and right ankle arthroscopy.  She states she currently is having bilateral plantar fascia pain.  Left foot is worse than the right.  She states she has had plantar fasciitis for about 12 years she has used inserts and stretching without relief.  She has pain with start up pain first thing in the morning.  Assessment & Plan: Visit Diagnoses:  1. Plantar fasciitis, bilateral     Plan: Will have patient follow-up with Dr. Burnetta for evaluation for shockwave therapy.  Follow-Up Instructions: No follow-ups on file.   Ortho Exam  Patient is alert, oriented, no adenopathy, well-dressed, normal affect, normal respiratory effort. Examination patient has dorsiflexion to neutral.  She is tender to palpation of the origin of the plantar fascia bilaterally.  The tarsal tunnel is not tender to palpation.  Lateral compression of the calcaneus is not tender.  Patient has worked on stretching and stiff soled shoes.  Imaging: No results found. No images are attached to the encounter.  Labs: Lab Results  Component Value Date   HGBA1C 5.5 11/01/2022   HGBA1C 5.9 12/15/2020   HGBA1C 5.7 08/05/2019   ESRSEDRATE 11 05/19/2022   ESRSEDRATE 16 09/18/2017   CRP <3.0 05/19/2022   CRP 0.2 (L) 09/18/2017   REPTSTATUS 08/15/2019 FINAL 08/13/2019   CULT >=100,000 COLONIES/mL ESCHERICHIA COLI (A) 08/13/2019   LABORGA ESCHERICHIA COLI (A) 08/13/2019     Lab Results  Component Value Date   ALBUMIN 4.1 07/31/2022   ALBUMIN 4.2  12/08/2021   ALBUMIN 4.3 12/15/2020    Lab Results  Component Value Date   MG 2.0 01/18/2018   Lab Results  Component Value Date   VD25OH 14.52 (L) 07/31/2022   VD25OH 16.76 (L) 04/19/2022   VD25OH 8.75 (L) 01/23/2022    No results found for: PREALBUMIN    Latest Ref Rng & Units 07/31/2022   10:10 AM 12/08/2020   12:10 AM 10/22/2020    3:27 PM  CBC EXTENDED  WBC 4.0 - 10.5 K/uL 6.7  8.7  7.9   RBC 3.87 - 5.11 Mil/uL 4.15  4.15  4.02   Hemoglobin 12.0 - 15.0 g/dL 86.6  86.4  86.6   HCT 36.0 - 46.0 % 40.8  40.0  39.4   Platelets 150.0 - 400.0 K/uL 297.0  320  321   NEUT# 1.7 - 7.7 K/uL   4.9   Lymph# 0.7 - 4.0 K/uL   2.3      There is no height or weight on file to calculate BMI.  Orders:  No orders of the defined types were placed in this encounter.  No orders of the defined types were placed in this encounter.    Procedures: No procedures performed  Clinical Data: No additional findings.  ROS:  All other systems negative, except as noted in the HPI. Review of  Systems  Objective: Vital Signs: LMP 08/24/2018 (Exact Date)   Specialty Comments:  No specialty comments available.  PMFS History: Patient Active Problem List   Diagnosis Date Noted   Candidal intertrigo 02/02/2023   Plantar fasciitis 02/02/2023   Chronic gastric ulcer without hemorrhage and without perforation 11/01/2022   Migraine 11/01/2022   Traumatic arthritis of right ankle 08/25/2022   Fibromyalgia 06/12/2022   Positive ANA (antinuclear antibody) 05/19/2022   Anxiety and depression 01/23/2022   Breast pain, left 12/08/2021   New onset of headaches after age 71 12/08/2021   Endometrial polyp 06/08/2021   Rib pain on left side 06/08/2021   History of gastric ulcer 06/08/2021   Urine frequency 06/08/2021   Post-traumatic osteoarthritis of right ankle 04/08/2021   Encounter for general adult medical examination with abnormal findings 12/15/2020   History of COVID-19 12/15/2020    Trochanteric bursitis, left hip 10/08/2020   Proteinuria 01/19/2020   Arthritis of right knee 08/21/2019   Nocturia 08/05/2019   History of abnormal cervical Pap smear 07/09/2018   Polyp of sigmoid colon    Hypokalemia 02/05/2018   Thiamine  deficiency 01/17/2018   Arthralgia 09/18/2017   Tick bite 09/18/2017   Vitamin D  deficiency 09/18/2017   Vision loss 09/18/2017   Constipation 04/28/2016   Right ankle pain 04/28/2016   Abnormal uterine bleeding 02/07/2016   Iron  deficiency 02/04/2016   Special screening for malignant neoplasms, colon 10/14/2015   Skin nodule 09/20/2015   Status post left partial knee replacement, medial aspect 07/09/2015   Obesity (BMI 30.0-34.9) 07/09/2015   Iron  deficiency anemia 01/14/2015   Hypothyroidism 10/01/2014   Cramps, extremity 10/01/2014   Allergic rhinitis 10/01/2014   S/P gastric bypass 10/01/2014   Past Medical History:  Diagnosis Date   Arthritis    OA   Complication of anesthesia    ITCHING TO FACE, BENADRYL  HELP BUT DROPS BLOOD PRESSURE   DVT (deep venous thrombosis) (HCC)    following first knee surgery in 2011   Gastric ulcer    Hashimoto's disease 09/18/2017   Headache    Heterozygous factor V Leiden mutation (HCC)    Hives    chronic   Hypothyroid    Iron  deficiency anemia    Migraine     Family History  Problem Relation Age of Onset   Stroke Mother    Heart attack Mother    AAA (abdominal aortic aneurysm) Mother    Kidney cancer Mother    Atrial fibrillation Mother    Diabetes Father    Dementia Father    Breast cancer Sister 73   Leukemia Sister    Breast cancer Maternal Grandmother        late 50's   Breast cancer Maternal Aunt 50       BRACA + dx twice age 101 and 103   Arthritis Other        parent   Hyperlipidemia Other        parent   Stroke Other        parent   Hypertension Other        parent   Kidney disease Other        parent, grandparent   Diabetes Other        parent, grandparent   Renal  cancer Other        parent    Past Surgical History:  Procedure Laterality Date   ANKLE ARTHROSCOPY Right 08/25/2022   Procedure: RIGHT ANKLE ARTHROSCOPY;  Surgeon: Harden Jerona GAILS,  MD;  Location: [facility] OR;  Service: Orthopedics;  Laterality: Right;   ANKLE FRACTURE SURGERY     1994, 1996   CHOLECYSTECTOMY     1998   COLONOSCOPY WITH PROPOFOL  N/A 06/14/2018   Procedure: COLONOSCOPY WITH BIOPSY;  Surgeon: Jinny Carmine, MD;  Location: [facility];  Service: Endoscopy;  Laterality: N/A;   DILATION AND CURETTAGE OF UTERUS     1994, 2001   ESOPHAGOGASTRODUODENOSCOPY (EGD) WITH PROPOFOL  N/A 06/14/2018   Procedure: ESOPHAGOGASTRODUODENOSCOPY (EGD) WITH PROPOFOL ;  Surgeon: Jinny Carmine, MD;  Location: [facility];  Service: Endoscopy;  Laterality: N/A;   FOREIGN BODY REMOVAL N/A 06/14/2018   Procedure: FOREIGN BODY REMOVAL;  Surgeon: Jinny Carmine, MD;  Location: [facility];  Service: Endoscopy;  Laterality: N/A;  Staple Removal from Stomach   GASTRIC BYPASS     2012, revision 2013   KNEE ARTHROSCOPY     2011, 2013, 2015   KNEE SURGERY Right 09/21/2021   POLYPECTOMY N/A 06/14/2018   Procedure: POLYPECTOMY;  Surgeon: Jinny Carmine, MD;  Location: [facility];  Service: Endoscopy;  Laterality: N/A;   TOOTH EXTRACTION     2010   TOTAL KNEE ARTHROPLASTY Left 05/25/2015   Procedure: LEFT TOTAL KNEE ARTHROPLASTY;  Surgeon: Evalene JONETTA Chancy, MD;  Location: [facility] OR;  Service: Orthopedics;  Laterality: Left;   TUBAL LIGATION     2004   Social History   Occupational History   Not on file  Tobacco Use   Smoking status: Never    Passive exposure: Current   Smokeless tobacco: Never  Vaping Use   Vaping status: Never Used  Substance and Sexual Activity   Alcohol use: No    Alcohol/week: 0.0 standard drinks of alcohol   Drug use: No   Sexual activity: Not on file

## 2023-02-20 ENCOUNTER — Encounter: Payer: Self-pay | Admitting: Internal Medicine

## 2023-02-20 ENCOUNTER — Other Ambulatory Visit (HOSPITAL_BASED_OUTPATIENT_CLINIC_OR_DEPARTMENT_OTHER): Payer: Self-pay

## 2023-02-25 ENCOUNTER — Other Ambulatory Visit: Payer: Self-pay | Admitting: Family Medicine

## 2023-03-05 ENCOUNTER — Encounter: Payer: Commercial Managed Care - HMO | Admitting: Obstetrics and Gynecology

## 2023-03-09 ENCOUNTER — Ambulatory Visit: Payer: Managed Care, Other (non HMO) | Admitting: Neurology

## 2023-03-14 ENCOUNTER — Other Ambulatory Visit: Payer: Self-pay

## 2023-03-14 DIAGNOSIS — K257 Chronic gastric ulcer without hemorrhage or perforation: Secondary | ICD-10-CM

## 2023-03-14 MED ORDER — PANTOPRAZOLE SODIUM 40 MG PO TBEC: 40.0000 mg | DELAYED_RELEASE_TABLET | Freq: Every day | ORAL | 3 refills | Status: AC

## 2023-04-03 ENCOUNTER — Ambulatory Visit: Payer: Commercial Managed Care - HMO | Admitting: Sports Medicine

## 2023-04-03 ENCOUNTER — Other Ambulatory Visit (HOSPITAL_BASED_OUTPATIENT_CLINIC_OR_DEPARTMENT_OTHER): Payer: Self-pay

## 2023-04-04 ENCOUNTER — Other Ambulatory Visit: Payer: Self-pay

## 2023-04-13 ENCOUNTER — Other Ambulatory Visit: Payer: Self-pay

## 2023-04-13 DIAGNOSIS — E66811 Obesity, class 1: Secondary | ICD-10-CM

## 2023-04-20 ENCOUNTER — Other Ambulatory Visit (HOSPITAL_BASED_OUTPATIENT_CLINIC_OR_DEPARTMENT_OTHER): Payer: Self-pay

## 2023-04-23 ENCOUNTER — Other Ambulatory Visit (HOSPITAL_COMMUNITY)
Admission: RE | Admit: 2023-04-23 | Discharge: 2023-04-23 | Disposition: A | Discharge status: Home / Self Care | Source: Ambulatory Visit | Attending: Obstetrics and Gynecology | Admitting: Obstetrics and Gynecology

## 2023-04-23 ENCOUNTER — Ambulatory Visit: Payer: Commercial Managed Care - HMO | Admitting: Obstetrics and Gynecology

## 2023-04-23 ENCOUNTER — Other Ambulatory Visit: Payer: Self-pay

## 2023-04-23 VITALS — BP 117/72 | HR 72 | Ht 65.0 in | Wt 180.0 lb

## 2023-04-23 DIAGNOSIS — Z803 Family history of malignant neoplasm of breast: Secondary | ICD-10-CM

## 2023-04-23 DIAGNOSIS — Z124 Encounter for screening for malignant neoplasm of cervix: Secondary | ICD-10-CM

## 2023-04-23 DIAGNOSIS — Z1339 Encounter for screening examination for other mental health and behavioral disorders: Secondary | ICD-10-CM

## 2023-04-23 DIAGNOSIS — Z1211 Encounter for screening for malignant neoplasm of colon: Secondary | ICD-10-CM | POA: Diagnosis not present

## 2023-04-23 DIAGNOSIS — Z01419 Encounter for gynecological examination (general) (routine) without abnormal findings: Secondary | ICD-10-CM

## 2023-04-23 DIAGNOSIS — N840 Polyp of corpus uteri: Secondary | ICD-10-CM

## 2023-04-23 DIAGNOSIS — B372 Candidiasis of skin and nail: Secondary | ICD-10-CM

## 2023-04-23 DIAGNOSIS — Z86018 Personal history of other benign neoplasm: Secondary | ICD-10-CM

## 2023-04-23 DIAGNOSIS — Z1231 Encounter for screening mammogram for malignant neoplasm of breast: Secondary | ICD-10-CM | POA: Diagnosis not present

## 2023-04-23 NOTE — Progress Notes (Signed)
 ANNUAL EXAM Patient name: Whitney Hill MRN [id number]  Date of birth: Oct 02, 1970 Chief Complaint:   Gynecologic Exam (Painful intercourse)  History of Present Illness:   Whitney Hill is a 53 y.o. female being seen today for a routine annual exam.    History significant for: Family history of breast cancer: in her aunt (dx in her 30s, BRCA +) and her sister (dx in her 19s, uncertain genetic history), patient reports negative BRCA testing for herself about 5 years ago Factor V Leiden History of uterine polyps and fibroids: had workup for bleeding and was diagnosed with these 5 years ago. No bleeding since. Wants to know if she needs follow up on this. Chronic breast pain: had multiple mammograms and saw a breast surgeon related to this 1-2 years ago, reports pain had not worsened, maybe slightly improved and is stable   Last pap: 2020, normal  Last mammogram: 2023 Last colonoscopy: 2020, due for screening this year  Last DEXA: never, denies risk factors         04/23/2023    2:28 PM 02/02/2023   11:46 AM 11/01/2022   11:23 AM 07/31/2022    9:33 AM 04/19/2022    3:58 PM  Depression screen PHQ 2/9  Decreased Interest 0 0 0 0 0  Down, Depressed, Hopeless 0 0 0 0 0  PHQ - 2 Score 0 0 0 0 0  Altered sleeping 0 1 1 1 1   Tired, decreased energy 1 1 1 1 1   Change in appetite 0 0 0 0 0  Feeling bad or failure about yourself  0 0 0 1 0  Trouble concentrating 1 1 1 1 1   Moving slowly or fidgety/restless 0 0 0 0 0  Suicidal thoughts 0 0 0 0 0  PHQ-9 Score 2 3 3 4 3   Difficult doing work/chores  Not difficult at all Not difficult at all Not difficult at all Not difficult at all        04/23/2023    2:28 PM 02/02/2023   11:46 AM 11/01/2022   11:24 AM 07/31/2022    9:33 AM  GAD 7 : Generalized Anxiety Score  Nervous, Anxious, on Edge 0 0 0 0  Control/stop worrying 1 0 0 0  Worry too much - different things 1 0 0 1  Trouble relaxing 0 1 0 0  Restless 0 0 0 0  Easily  annoyed or irritable 0 1 0 0  Afraid - awful might happen 0 0 0 0  Total GAD 7 Score 2 2 0 1  Anxiety Difficulty  Not difficult at all Not difficult at all Not difficult at all     Review of Systems:   Pertinent items are noted in HPI Denies any headaches, blurred vision, fatigue, shortness of breath, chest pain, abdominal pain, abnormal vaginal discharge/itching/odor/irritation, problems with periods, bowel movements, urination, or intercourse unless otherwise stated above. Pertinent History Reviewed:  Reviewed past medical,surgical, social and family history.  Reviewed problem list, medications and allergies. Physical Assessment:   Vitals:   04/23/23 1421  BP: 117/72  Pulse: 72  Weight: 180 lb (81.6 kg)  Height: 5\' 5"  (1.651 m)  Body mass index is 29.95 kg/m.        Physical Examination:   General appearance - well appearing, and in no distress  Mental status - alert, oriented to person, place, and time  Psych:  She has a normal mood and affect  Skin - warm and dry, normal  color, no suspicious lesions noted  Chest - effort normal, all lung fields clear to auscultation bilaterally  Heart - normal rate and regular rhythm  Neck:  midline trachea, no thyromegaly or nodules  Breasts - breasts appear normal, no suspicious masses, no skin or nipple changes or  axillary nodes  Abdomen - soft, nontender, nondistended, no masses or organomegaly  Pelvic -  VULVA: normal appearing vulva with no masses, tenderness or lesions   VAGINA: normal appearing vagina with normal color and discharge, no lesions   CERVIX: normal appearing cervix without discharge or lesions, no CMT  Thin prep pap is done with HR HPV cotesting  UTERUS: uterus is felt to be normal size, shape, consistency and nontender   ADNEXA: No adnexal masses or tenderness noted.  Extremities:  No swelling or varicosities noted  Chaperone present for exam  No results found for this or any previous visit (from the past 24  hours).  Assessment & Plan:  1. Well woman exam with routine gynecological exam (Primary) Pap/HPV Mammo ordered Colonoscopy referral DEXA age 53  2. Cervical cancer screening - Cytology - PAP  3. Encounter for screening mammogram for malignant neoplasm of breast - MM 3D SCREENING MAMMOGRAM BILATERAL BREAST; Future  4. Colon cancer screening - Ambulatory referral to Gastroenterology  5. Family history of breast cancer in first degree relative Gail index risk calculated at 14.8% lifetime risk which is considered average risk. She has had negative BRCA testing approximately 5 years ago. I have encouraged her to reach out to her sister to see if she had genetic testing done in the setting of her premenopausal breast cancer diagnosis. If she had a genetic condition different from BRCA, then the patient should consider testing  6. History of uterine fibroid Counseled on options of repeat ultrasound, patient reports some discomfort with sex which could be related to fibroid history. No bleeding currently - US  PELVIC COMPLETE WITH TRANSVAGINAL; Future  7. Uterine polyp No bleeding reported. Counseled on repeating ultrasound, patient requests - US  PELVIC COMPLETE WITH TRANSVAGINAL; Future   Orders Placed This Encounter  Procedures   MM 3D SCREENING MAMMOGRAM BILATERAL BREAST   US  PELVIC COMPLETE WITH TRANSVAGINAL   Ambulatory referral to Gastroenterology     Marci Setter, MD 04/23/2023 3:31 PM

## 2023-04-25 MED ORDER — NYSTATIN 100000 UNIT/GM EX POWD: 1.0000 | Freq: Three times a day (TID) | CUTANEOUS | 0 refills | Status: AC | PRN

## 2023-04-26 ENCOUNTER — Other Ambulatory Visit (HOSPITAL_COMMUNITY): Payer: Self-pay

## 2023-04-26 ENCOUNTER — Telehealth: Payer: Self-pay

## 2023-04-26 NOTE — Telephone Encounter (Signed)
 Pharmacy Patient Advocate Encounter   Received notification from RX Request Messages that prior authorization for Zepbound is required/requested.   Insurance verification completed.   The patient is insured through [employer] .   Per test claim: Medication is not covered by pt's plan

## 2023-04-26 NOTE — Telephone Encounter (Signed)
 Medication is not covered by pt's plan, see encounter dated 04/26/2023 for further information. Please sign off on rx in this encounter as PA team is unable to resolve RX requests. Thank you

## 2023-04-27 ENCOUNTER — Encounter: Payer: Self-pay | Admitting: Internal Medicine

## 2023-04-27 LAB — CYTOLOGY - PAP
Chlamydia: NEGATIVE
Comment: NEGATIVE
Comment: NEGATIVE
Comment: NORMAL
Diagnosis: NEGATIVE
High risk HPV: NEGATIVE
Neisseria Gonorrhea: NEGATIVE

## 2023-04-30 ENCOUNTER — Encounter (HOSPITAL_BASED_OUTPATIENT_CLINIC_OR_DEPARTMENT_OTHER): Payer: Self-pay

## 2023-04-30 ENCOUNTER — Ambulatory Visit (HOSPITAL_BASED_OUTPATIENT_CLINIC_OR_DEPARTMENT_OTHER)
Admission: RE | Admit: 2023-04-30 | Discharge: 2023-04-30 | Disposition: A | Discharge status: Home / Self Care | Source: Ambulatory Visit | Attending: Obstetrics and Gynecology | Admitting: Obstetrics and Gynecology

## 2023-04-30 ENCOUNTER — Encounter: Payer: Self-pay | Admitting: Obstetrics and Gynecology

## 2023-04-30 DIAGNOSIS — N840 Polyp of corpus uteri: Secondary | ICD-10-CM | POA: Diagnosis present

## 2023-04-30 DIAGNOSIS — Z1231 Encounter for screening mammogram for malignant neoplasm of breast: Secondary | ICD-10-CM | POA: Insufficient documentation

## 2023-04-30 DIAGNOSIS — Z86018 Personal history of other benign neoplasm: Secondary | ICD-10-CM | POA: Insufficient documentation

## 2023-05-01 ENCOUNTER — Telehealth: Payer: Self-pay

## 2023-05-01 ENCOUNTER — Encounter: Payer: Self-pay | Admitting: Obstetrics and Gynecology

## 2023-05-01 NOTE — Telephone Encounter (Signed)
 Patient made aware of results.  EMB scheduled for 05/18/23. Arrie Lares [employer]

## 2023-05-01 NOTE — Telephone Encounter (Signed)
-----   Message from Marci Setter sent at 05/01/2023  3:58 PM EDT ----- Please review EMB with patient and schedule her for this

## 2023-05-10 ENCOUNTER — Ambulatory Visit (INDEPENDENT_AMBULATORY_CARE_PROVIDER_SITE_OTHER): Payer: Commercial Managed Care - HMO | Admitting: Nurse Practitioner

## 2023-05-10 VITALS — BP 110/72 | HR 67 | Temp 98.2°F | Ht 65.0 in | Wt 177.0 lb

## 2023-05-10 DIAGNOSIS — E039 Hypothyroidism, unspecified: Secondary | ICD-10-CM | POA: Diagnosis not present

## 2023-05-10 DIAGNOSIS — E66811 Obesity, class 1: Secondary | ICD-10-CM

## 2023-05-10 MED ORDER — ZEPBOUND 12.5 MG/0.5ML ~~LOC~~ SOAJ: 12.5000 mg | SUBCUTANEOUS | 0 refills | Status: AC

## 2023-05-10 NOTE — Assessment & Plan Note (Signed)
 She has experienced significant weight loss with Zepbound  and reports no major side effects. She adheres to a high-protein diet following gastric bypass. She has recently noticed a stall in her weight loss. She is interested in increasing her dose. Increase Zepbound  to 12.5 mg weekly. Follow up in three months to assess weight loss progress.

## 2023-05-10 NOTE — Assessment & Plan Note (Signed)
 TSH levels fluctuate but are well-managed on levothyroxine . Continue Levothyroxine  150 mcg daily. Recheck thyroid  function at next appointment.

## 2023-05-10 NOTE — Progress Notes (Signed)
 Whitney Burkitt, NP-C Phone: [phone]  Whitney Hill is a 53 y.o. female who presents today for transfer of care.   Discussed the use of AI scribe software for clinical note transcription with the patient, who gave verbal consent to proceed.  History of Present Illness   Whitney Hill is a 53 year old female who presents for transfer of care with left ear pain and weight management concerns.  She has been experiencing left ear pain for the past couple of months. She has had ear infections twice in the last year, with symptoms that sometimes become severe but then clear up. She visited a [place] urgent care about four to five months ago and was prescribed antibiotics. Despite treatment, the pain persists intermittently. In February, while in [place], she experienced severe ear pain accompanied by a sensation of choking and subsequent illness lasting two weeks. The ear pain does not include fullness, popping, drainage, congestion, fever, or sore throat, except when she is getting sick.  She is managing her weight with Zepbound , which she pays for herself. She has been on a 10 mg dose for a long time and previously used Mounjaro  at 15 mg until shortages occurred. She started her weight loss journey at around 251 pounds and has lost approximately 80 pounds. She occasionally experiences nausea if she does not eat, which she attributes to her history of gastric bypass surgery. She emphasizes the importance of maintaining a high-protein diet.  She has a history of hypothyroidism managed with levothyroxine . A few years ago, her TSH levels varied significantly within the same week, which she believes contributes to her lifelong struggle with weight. She has not experienced new skin changes, nail changes, heart palpitations, or temperature regulation issues recently.  She has a history of multiple leg and ankle surgeries, with the most recent surgery in October last year resulting in significant  pain for three months. She developed plantar fasciitis but reports improvement after staying off her feet. She has not rescheduled a procedure initially planned to address the condition as the symptoms have subsided.      Social History   Tobacco Use  Smoking Status Never   Passive exposure: Current  Smokeless Tobacco Never    Current Outpatient Medications on File Prior to Visit  Medication Sig Dispense Refill   cyanocobalamin  (VITAMIN B12) 1000 MCG/ML injection Inject 1 mL (1000 mcg total) into the muscle once weekly for 4 weeks and then once monthly 3 mL 3   cyclobenzaprine  (FLEXERIL ) 5 MG tablet Take 1 tablet (5 mg total) by mouth 3 (three) times daily as needed (muscle pain). 30 tablet 1   diclofenac  sodium (VOLTAREN ) 1 % GEL Apply 2 g topically 4 (four) times daily as needed (pain.).     EPINEPHrine  0.3 mg/0.3 mL IJ SOAJ injection Inject 0.3 mg into the muscle as needed for anaphylaxis. 2 each 0   Folic Acid  5 MG CAPS Take 5 mg by mouth daily.     levothyroxine  (SYNTHROID ) 150 MCG tablet TAKE 1 TABLET BY MOUTH EVERY DAY BEFORE BREAKFAST 30 tablet 2   multivitamin-iron -minerals-folic acid  (CENTRUM) chewable tablet Chew 1 tablet by mouth daily.     nortriptyline  (PAMELOR ) 10 MG capsule Take 1 capsule (10 mg) at bedtime for 1 week, then increase to 2 capsules (20 mg) at bedtime thereafter 60 capsule 5   nystatin  (MYCOSTATIN /NYSTOP ) powder Apply 1 Application topically 3 (three) times daily as needed (candidal intertrigo). 60 g 0   ondansetron  (ZOFRAN ) 4 MG  tablet Take 1 tablet (4 mg total) by mouth every 8 (eight) hours as needed for nausea or vomiting. 20 tablet 3   pantoprazole  (PROTONIX ) 40 MG tablet Take 1 tablet (40 mg total) by mouth daily. 30 tablet 3   SUMAtriptan  (IMITREX ) 100 MG tablet Take 1 tablet (100 mg total) by mouth every 2 (two) hours as needed for migraine. Max 2 tablets in 24 hours. 10 tablet 5   Syringe/Needle, Disp, (SYRINGE 3CC/25GX1") 25G X 1" 3 ML MISC Use with  b12 injections 50 each 0   Vitamin D , Ergocalciferol , (DRISDOL ) 1.25 MG (50000 UNIT) CAPS capsule Take 1 capsule (50,000 Units total) by mouth every 7 (seven) days. (Patient taking differently: Take 50,000 Units by mouth every Thursday.) 12 capsule 1   No current facility-administered medications on file prior to visit.     ROS see history of present illness  Objective  Physical Exam Vitals:   05/10/23 1300  BP: 110/72  Pulse: 67  Temp: 98.2 F (36.8 C)  SpO2: 98%    BP Readings from Last 3 Encounters:  05/10/23 110/72  04/23/23 117/72  02/14/23 109/73   Wt Readings from Last 3 Encounters:  05/10/23 177 lb (80.3 kg)  04/23/23 180 lb (81.6 kg)  02/14/23 190 lb (86.2 kg)    Physical Exam Constitutional:      General: She is not in acute distress.    Appearance: Normal appearance.  HENT:     Head: Normocephalic.  Cardiovascular:     Rate and Rhythm: Normal rate and regular rhythm.     Heart sounds: Normal heart sounds.  Pulmonary:     Effort: Pulmonary effort is normal.     Breath sounds: Normal breath sounds.  Skin:    General: Skin is warm and dry.  Neurological:     General: No focal deficit present.     Mental Status: She is alert.  Psychiatric:        Mood and Affect: Mood normal.        Behavior: Behavior normal.     Assessment/Plan: Please see individual problem list.  Hypothyroidism, unspecified type Assessment & Plan: TSH levels fluctuate but are well-managed on levothyroxine . Continue Levothyroxine  150 mcg daily. Recheck thyroid  function at next appointment.    Obesity (BMI 30.0-34.9) Assessment & Plan: She has experienced significant weight loss with Zepbound  and reports no major side effects. She adheres to a high-protein diet following gastric bypass. She has recently noticed a stall in her weight loss. She is interested in increasing her dose. Increase Zepbound  to 12.5 mg weekly. Follow up in three months to assess weight loss  progress.  Orders: -     Zepbound ; Inject 12.5 mg into the skin once a week.  Dispense: 6 mL; Refill: 0    Return in about 3 months (around 08/10/2023) for Follow up.   Whitney Burkitt, NP-C [facility]

## 2023-05-16 ENCOUNTER — Other Ambulatory Visit: Payer: Self-pay

## 2023-05-16 ENCOUNTER — Other Ambulatory Visit (HOSPITAL_BASED_OUTPATIENT_CLINIC_OR_DEPARTMENT_OTHER): Payer: Self-pay

## 2023-05-18 ENCOUNTER — Ambulatory Visit: Admitting: Obstetrics and Gynecology

## 2023-05-18 VITALS — BP 92/64 | HR 66 | Ht 65.0 in | Wt 178.0 lb

## 2023-05-18 DIAGNOSIS — N941 Unspecified dyspareunia: Secondary | ICD-10-CM | POA: Diagnosis not present

## 2023-05-18 DIAGNOSIS — N8003 Adenomyosis of the uterus: Secondary | ICD-10-CM

## 2023-05-18 NOTE — Progress Notes (Signed)
   ESTABLISHED GYNECOLOGY VISIT Chief Complaint  Patient presents with   Follow-up    Subjective:  Whitney Hill is a 53 y.o. who presents today for follow up.  She had an ultrasound done due to a history of polyps and fibroids. Pap smear was normal with negative HPV  She also reports pain with intercourse. Has been present her whole life. Has pain with pelvic exams as well. Does not use lubricants. Unsure if it is related to dryness or not.   FINDINGS: Uterus   Measurements: 8.3 x 3.5 x 5.6 cm = volume: 85 mL. No fibroids or other mass visualized. Heterogeneous myometrial echotexture with numerous echogenic foci suggestive of adenomyosis.   Endometrium   Thickness: 1.6 mm.  No focal abnormality visualized.   Right ovary: Not visualized   Left ovary: Not visualized   Other findings   No abnormal free fluid.   IMPRESSION: 1. Heterogeneous uterus suggestive of adenomyosis. No visualized fibroids. 2. Nonvisualized ovaries.  Review of Systems:   Pertinent items are noted in HPI  Pertinent History Reviewed:  Reviewed past medical,surgical, social and family history.  Reviewed problem list, medications and allergies.  Objective:   Vitals:   05/18/23 0916  BP: 92/64  Pulse: 66  Weight: 178 lb (80.7 kg)  Height: 5\' 5"  (1.651 m)   Physical Examination:   General appearance - well appearing, and in no distress  Mental status - alert, oriented to person, place, and time  Psych:  normal mood and affect    Assessment and Plan:  1. Adenomyosis (Primary) Discussed findings on ultrasound in detail with the patient. Initially had discussed endometrial biopsy as thought the thickness for her endometrial stripe was 1.6 cm not mm. Her EMS is thin at 1.6 mm and does not require biopsy. I reviewed the findings on ultrasound and diagnosis of adenomyosis which is unlikely to cause her issues in her postmenopausal state but does make sense given her painful heavy periods  previously. Advised to follow up for any vaginal bleeding.  2. Dyspareunia in female Reviewed symptoms in detail. If feels like dryness/friction, try vaginal lubricants. Given factor V leiden, would not want to use vaginal estrogen. Reviewed option of pelvic floor PT if it feels muscular in origin. She wishes to start with lubricants and will let me know if she wants to use pelvic PT     No follow-ups on file.  Future Appointments  Date Time Provider Department Center  05/21/2023  3:15 PM Timothy Ford, MD [facility] None  06/07/2023  2:00 PM Ellene Gustin, MD [facility] None  08/10/2023 10:40 AM Bluford Burkitt, NP [facility]    Marci Setter, MD, FACOG Obstetrician & Gynecologist, [facility], [facility]

## 2023-05-21 ENCOUNTER — Ambulatory Visit: Admitting: Orthopedic Surgery

## 2023-05-21 ENCOUNTER — Other Ambulatory Visit (HOSPITAL_BASED_OUTPATIENT_CLINIC_OR_DEPARTMENT_OTHER): Payer: Self-pay

## 2023-05-21 DIAGNOSIS — M1711 Unilateral primary osteoarthritis, right knee: Secondary | ICD-10-CM | POA: Diagnosis not present

## 2023-05-21 DIAGNOSIS — M722 Plantar fascial fibromatosis: Secondary | ICD-10-CM

## 2023-05-21 DIAGNOSIS — M19171 Post-traumatic osteoarthritis, right ankle and foot: Secondary | ICD-10-CM | POA: Diagnosis not present

## 2023-05-21 MED ORDER — PREDNISONE 10 MG PO TABS
10.0000 mg | ORAL_TABLET | Freq: Every day | ORAL | 0 refills | Status: AC
  Filled 2023-05-21: qty 30, 30d supply, fill #0

## 2023-05-22 ENCOUNTER — Encounter: Payer: Self-pay | Admitting: Orthopedic Surgery

## 2023-05-22 DIAGNOSIS — M19171 Post-traumatic osteoarthritis, right ankle and foot: Secondary | ICD-10-CM | POA: Diagnosis not present

## 2023-05-22 DIAGNOSIS — M1711 Unilateral primary osteoarthritis, right knee: Secondary | ICD-10-CM | POA: Diagnosis not present

## 2023-05-22 MED ORDER — LIDOCAINE HCL 1 % IJ SOLN
2.0000 mL | INTRAMUSCULAR | Status: AC | PRN
  Administered 2023-05-22: 2 mL

## 2023-05-22 MED ORDER — METHYLPREDNISOLONE ACETATE 40 MG/ML IJ SUSP
40.0000 mg | INTRAMUSCULAR | Status: AC | PRN
  Administered 2023-05-22: 40 mg via INTRA_ARTICULAR

## 2023-05-22 MED ORDER — LIDOCAINE HCL (PF) 1 % IJ SOLN
5.0000 mL | INTRAMUSCULAR | Status: AC | PRN
  Administered 2023-05-22: 5 mL

## 2023-05-22 NOTE — Progress Notes (Signed)
 Office Visit Note   Patient: Whitney Hill           Date of Birth: 05/06/1970           MRN: [id number] Visit Date: 05/21/2023              Requested by: Bluford Burkitt, NP [id number] [place],  [place] [address] PCP: Bluford Burkitt, NP  Chief Complaint  Patient presents with   Right Ankle - Follow-up    S/p ankle scope 08/2022      HPI: Patient is a 53 year old woman who is seen for 3 separate issues.  Patient has a history of traumatic arthritis right ankle status post ankle arthroscopy August 2024.  Patient also has arthritis of her right knee status post injection of the right knee October 2024.  Patient also has a history of planta fasciitis which she states is asymptomatic at this time.  Assessment & Plan: Visit Diagnoses:  1. Post-traumatic osteoarthritis of right ankle   2. Arthritis of right knee   3. Plantar fasciitis, bilateral     Plan: Right knee and right ankle were injected.  Patient was provided a prescription for prednisone  for flareups.  Follow-Up Instructions: Return if symptoms worsen or fail to improve.   Ortho Exam  Patient is alert, oriented, no adenopathy, well-dressed, normal affect, normal respiratory effort. Examination patient's feet are asymptomatic.  She is tender to palpation anterior medial to the right ankle.  Subtalar motion and ankle have good range of motion negative anterior drawer.  Right knee has crepitation with range of motion collaterals and cruciates are stable.  Imaging: No results found. No images are attached to the encounter.  Labs: Lab Results  Component Value Date   HGBA1C 5.5 11/01/2022   HGBA1C 5.9 12/15/2020   HGBA1C 5.7 08/05/2019   ESRSEDRATE 11 05/19/2022   ESRSEDRATE 16 09/18/2017   CRP <3.0 05/19/2022   CRP 0.2 (L) 09/18/2017   REPTSTATUS 08/15/2019 FINAL 08/13/2019   CULT >=100,000 COLONIES/mL ESCHERICHIA COLI (A) 08/13/2019   LABORGA ESCHERICHIA COLI (A) 08/13/2019     Lab Results   Component Value Date   ALBUMIN 4.1 07/31/2022   ALBUMIN 4.2 12/08/2021   ALBUMIN 4.3 12/15/2020    Lab Results  Component Value Date   MG 2.0 01/18/2018   Lab Results  Component Value Date   VD25OH 14.52 (L) 07/31/2022   VD25OH 16.76 (L) 04/19/2022   VD25OH 8.75 (L) 01/23/2022    No results found for: "PREALBUMIN"    Latest Ref Rng & Units 07/31/2022   10:10 AM 12/08/2020   12:10 AM 10/22/2020    3:27 PM  CBC EXTENDED  WBC 4.0 - 10.5 K/uL 6.7  8.7  7.9   RBC 3.87 - 5.11 Mil/uL 4.15  4.15  4.02   Hemoglobin 12.0 - 15.0 g/dL 84.1  66.0  63.0   HCT 36.0 - 46.0 % 40.8  40.0  39.4   Platelets 150.0 - 400.0 K/uL 297.0  320  321   NEUT# 1.7 - 7.7 K/uL   4.9   Lymph# 0.7 - 4.0 K/uL   2.3      There is no height or weight on file to calculate BMI.  Orders:  No orders of the defined types were placed in this encounter.  Meds ordered this encounter  Medications   predniSONE  (DELTASONE ) 10 MG tablet    Sig: Take 1 tablet (10 mg total) by mouth daily with breakfast.    Dispense:  30 tablet    Refill:  0     Procedures: Large Joint Inj: R knee on 05/22/2023 8:02 AM Indications: pain and diagnostic evaluation Details: 22 G 1.5 in needle, anteromedial approach  Arthrogram: No  Medications: 5 mL lidocaine  (PF) 1 %; 40 mg methylPREDNISolone  acetate 40 MG/ML Outcome: tolerated well, no immediate complications Procedure, treatment alternatives, risks and benefits explained, specific risks discussed. Consent was given by the patient. Immediately prior to procedure a time out was called to verify the correct patient, procedure, equipment, support staff and site/side marked as required. Patient was prepped and draped in the usual sterile fashion.    Medium Joint Inj: R ankle on 05/22/2023 8:05 AM Indications: pain and diagnostic evaluation Details: 22 G 1.5 in needle, anteromedial approach Medications: 2 mL lidocaine  1 %; 40 mg methylPREDNISolone  acetate 40 MG/ML Outcome:  tolerated well, no immediate complications Procedure, treatment alternatives, risks and benefits explained, specific risks discussed. Consent was given by the patient. Immediately prior to procedure a time out was called to verify the correct patient, procedure, equipment, support staff and site/side marked as required. Patient was prepped and draped in the usual sterile fashion.      Clinical Data: No additional findings.  ROS:  All other systems negative, except as noted in the HPI. Review of Systems  Objective: Vital Signs: LMP 08/24/2018 (Exact Date)   Specialty Comments:  No specialty comments available.  PMFS History: Patient Active Problem List   Diagnosis Date Noted   Candidal intertrigo 02/02/2023   Plantar fasciitis 02/02/2023   Chronic gastric ulcer without hemorrhage and without perforation 11/01/2022   Migraine 11/01/2022   Traumatic arthritis of right ankle 08/25/2022   Fibromyalgia 06/12/2022   Positive ANA (antinuclear antibody) 05/19/2022   Anxiety and depression 01/23/2022   Breast pain, left 12/08/2021   New onset of headaches after age 73 12/08/2021   Endometrial polyp 06/08/2021   Rib pain on left side 06/08/2021   History of gastric ulcer 06/08/2021   Urine frequency 06/08/2021   Post-traumatic osteoarthritis of right ankle 04/08/2021   Encounter for general adult medical examination with abnormal findings 12/15/2020   History of COVID-19 12/15/2020   Trochanteric bursitis, left hip 10/08/2020   Proteinuria 01/19/2020   Arthritis of right knee 08/21/2019   Nocturia 08/05/2019   History of abnormal cervical Pap smear 07/09/2018   Polyp of sigmoid colon    Hypokalemia 02/05/2018   Thiamine  deficiency 01/17/2018   Arthralgia 09/18/2017   Tick bite 09/18/2017   Vitamin D  deficiency 09/18/2017   Vision loss 09/18/2017   Constipation 04/28/2016   Right ankle pain 04/28/2016   Abnormal uterine bleeding 02/07/2016   Iron  deficiency 02/04/2016    Special screening for malignant neoplasms, colon 10/14/2015   Skin nodule 09/20/2015   Status post left partial knee replacement, medial aspect 07/09/2015   Obesity (BMI 30.0-34.9) 07/09/2015   Iron  deficiency anemia 01/14/2015   Hypothyroidism 10/01/2014   Cramps, extremity 10/01/2014   Allergic rhinitis 10/01/2014   S/P gastric bypass 10/01/2014   Past Medical History:  Diagnosis Date   Arthritis    OA   Complication of anesthesia    "ITCHING TO FACE, BENADRYL  HELP BUT DROPS BLOOD PRESSURE"   DVT (deep venous thrombosis) (HCC)    following first knee surgery in 2011   Gastric ulcer    Hashimoto's disease 09/18/2017   Headache    Heterozygous factor V Leiden mutation (HCC)    Hives    chronic   Hypothyroid  Iron  deficiency anemia    Migraine     Family History  Problem Relation Age of Onset   Stroke Mother    Heart attack Mother    AAA (abdominal aortic aneurysm) Mother    Kidney cancer Mother    Atrial fibrillation Mother    Diabetes Father    Dementia Father    Breast cancer Sister 48   Leukemia Sister    Breast cancer Maternal Grandmother        late 50's   Breast cancer Maternal Aunt 27       BRACA + dx twice age 60 and 82   Arthritis Other        parent   Hyperlipidemia Other        parent   Stroke Other        parent   Hypertension Other        parent   Kidney disease Other        parent, grandparent   Diabetes Other        parent, grandparent   Renal cancer Other        parent    Past Surgical History:  Procedure Laterality Date   ANKLE ARTHROSCOPY Right 08/25/2022   Procedure: RIGHT ANKLE ARTHROSCOPY;  Surgeon: Timothy Ford, MD;  Location: [facility] OR;  Service: Orthopedics;  Laterality: Right;   ANKLE FRACTURE SURGERY     1994, 1996   CHOLECYSTECTOMY     1998   COLONOSCOPY WITH PROPOFOL  N/A 06/14/2018   Procedure: COLONOSCOPY WITH BIOPSY;  Surgeon: Marnee Sink, MD;  Location: [facility];  Service: Endoscopy;  Laterality: N/A;    DILATION AND CURETTAGE OF UTERUS     1994, 2001   ESOPHAGOGASTRODUODENOSCOPY (EGD) WITH PROPOFOL  N/A 06/14/2018   Procedure: ESOPHAGOGASTRODUODENOSCOPY (EGD) WITH PROPOFOL ;  Surgeon: Marnee Sink, MD;  Location: [facility];  Service: Endoscopy;  Laterality: N/A;   FOREIGN BODY REMOVAL N/A 06/14/2018   Procedure: FOREIGN BODY REMOVAL;  Surgeon: Marnee Sink, MD;  Location: [facility];  Service: Endoscopy;  Laterality: N/A;  Staple Removal from Stomach   GASTRIC BYPASS     2012, revision 2013   KNEE ARTHROSCOPY     2011, 2013, 2015   KNEE SURGERY Right 09/21/2021   POLYPECTOMY N/A 06/14/2018   Procedure: POLYPECTOMY;  Surgeon: Marnee Sink, MD;  Location: [facility];  Service: Endoscopy;  Laterality: N/A;   TOOTH EXTRACTION     2010   TOTAL KNEE ARTHROPLASTY Left 05/25/2015   Procedure: LEFT TOTAL KNEE ARTHROPLASTY;  Surgeon: Saundra Curl, MD;  Location: [facility] OR;  Service: Orthopedics;  Laterality: Left;   TUBAL LIGATION     2004   Social History   Occupational History   Not on file  Tobacco Use   Smoking status: Never    Passive exposure: Current   Smokeless tobacco: Never  Vaping Use   Vaping status: Never Used  Substance and Sexual Activity   Alcohol use: No    Alcohol/week: 0.0 standard drinks of alcohol   Drug use: No   Sexual activity: Not on file

## 2023-05-30 NOTE — Progress Notes (Deleted)
 NEUROLOGY FOLLOW UP OFFICE NOTE  DARENE Hill [id number]  Subjective:  Whitney Hill is a 53 y.o. year old right-handed female with a medical history of hypothyroidism, migraines, iron  deficiency anemia, OA, gastric bypass surgery, thiamine  deficiency, B12 deficiency, vit D deficiency, folate deficiency who we last saw on 02/14/23 for headaches.  To briefly review: 02/14/23: Patient has a chronic headache for many years. It became more severe about 2 years ago. She would mainly have one when waking up. It is usually right sided around the eye and back of the head, but can be on the left side. It was occurred every day and would last all day. She took tylenol , but this did nothing. Her daughter has bad migraines and gave her a Imitrex  100 mg and this got rid of the headache. She endorses photophobia, phonophobia, and nausea, but no vomiting. She denies any recent vision changes or vision loss.   She would get up and move around and sometimes headache would improve. She can now get a headache at any time. She does not think laying down makes her headache worse. She does not think she snores. She feels well rested in the morning. She is not tired throughout the day.   She then spoke with her PCP. She was started on Imitrex  50 mg PRN about 2 years ago. This helped, but she was having to take all 9 every month. She does not usually take at headache They improved but increased in frequency about 3-4 months ago. She was started on Topamax  25 mg daily on 11/01/22 by PCP. Patient took it about 3 weeks but stopped because it made all food and drink taste horrible. Taste returned to normal after stopping it.    Her current headaches are 7/10 but were 10/10 about 2 years ago. She will have 2-3 headaches per week currently.   Current medications: -Imitrex  50 mg PRN - almost always helps, maybe not always completely resolved though -Zofran  PRN for nausea   Past medications:  -Topamax  - stopped due  to abnormal taste   Caffeine use: 2 sodas per day Smoker: Never EtOH use: None Restrictive diet: No Family history of neurologic disease: Daughter and son with migraines   Of note, patient has seen neurology in the past for weakness and vision changes and found to have B1 deficiency. She also gets B12 injections.   Of note, patient has had 12 ankle or leg surgeries due to fractures and OA.  Most recent Assessment and Plan (02/14/23): Whitney Hill is a 53 y.o. female who presents for evaluation of headaches. She has a relevant medical history of hypothyroidism, migraines, iron  deficiency anemia, OA, gastric bypass surgery, thiamine  deficiency, B12 deficiency, vit D deficiency, folate deficiency. Her neurological examination is essentially normal today. Patient's symptoms are most consistent with episodic migraine without aura. She has 8-12 migraines per month currently. She has previously taken Topamax , but was unable to tolerate it (metallic taste).   PLAN: -Continue folate, vit D, and B12 supplementation -For migraines: Migraine prevention: Nortriptyline  10 mg at bedtime for 1 week then increase to 20 mg at bedtime Migraine rescue:  Sumatriptan  100 mg as needed at headache onset, can repeat in 2 hours if needed. Zofran  as needed for nausea Limit use of pain relievers to no more than 2 days out of week to prevent risk of rebound or medication-overuse headache. Keep headache diary  Since their last visit: ***  MEDICATIONS:  Outpatient Encounter Medications as of 06/07/2023  Medication Sig Note   cyanocobalamin  (VITAMIN B12) 1000 MCG/ML injection Inject 1 mL (1000 mcg total) into the muscle once weekly for 4 weeks and then once monthly    cyclobenzaprine  (FLEXERIL ) 5 MG tablet Take 1 tablet (5 mg total) by mouth 3 (three) times daily as needed (muscle pain).    diclofenac  sodium (VOLTAREN ) 1 % GEL Apply 2 g topically 4 (four) times daily as needed (pain.).    EPINEPHrine  0.3 mg/0.3  mL IJ SOAJ injection Inject 0.3 mg into the muscle as needed for anaphylaxis. 08/25/2022: Has never used   Folic Acid  5 MG CAPS Take 5 mg by mouth daily. 08/18/2022: New supplement patient will start post operatively   levothyroxine  (SYNTHROID ) 150 MCG tablet TAKE 1 TABLET BY MOUTH EVERY DAY BEFORE BREAKFAST    multivitamin-iron -minerals-folic acid  (CENTRUM) chewable tablet Chew 1 tablet by mouth daily.    nortriptyline  (PAMELOR ) 10 MG capsule Take 1 capsule (10 mg) at bedtime for 1 week, then increase to 2 capsules (20 mg) at bedtime thereafter    nystatin  (MYCOSTATIN /NYSTOP ) powder Apply 1 Application topically 3 (three) times daily as needed (candidal intertrigo).    ondansetron  (ZOFRAN ) 4 MG tablet Take 1 tablet (4 mg total) by mouth every 8 (eight) hours as needed for nausea or vomiting.    pantoprazole  (PROTONIX ) 40 MG tablet Take 1 tablet (40 mg total) by mouth daily.    predniSONE  (DELTASONE ) 10 MG tablet Take 1 tablet (10 mg total) by mouth daily with breakfast.    SUMAtriptan  (IMITREX ) 100 MG tablet Take 1 tablet (100 mg total) by mouth every 2 (two) hours as needed for migraine. Max 2 tablets in 24 hours.    Syringe/Needle, Disp, (SYRINGE 3CC/25GX1") 25G X 1" 3 ML MISC Use with b12 injections    tirzepatide  (ZEPBOUND ) 12.5 MG/0.5ML Pen Inject 12.5 mg into the skin once a week.    Vitamin D , Ergocalciferol , (DRISDOL ) 1.25 MG (50000 UNIT) CAPS capsule Take 1 capsule (50,000 Units total) by mouth every 7 (seven) days. (Patient taking differently: Take 50,000 Units by mouth every Thursday.)    No facility-administered encounter medications on file as of 06/07/2023.    PAST MEDICAL HISTORY: Past Medical History:  Diagnosis Date   Arthritis    OA   Complication of anesthesia    "ITCHING TO FACE, BENADRYL  HELP BUT DROPS BLOOD PRESSURE"   DVT (deep venous thrombosis) (HCC)    following first knee surgery in 2011   Gastric ulcer    Hashimoto's disease 09/18/2017   Headache    Heterozygous  factor V Leiden mutation (HCC)    Hives    chronic   Hypothyroid    Iron  deficiency anemia    Migraine     PAST SURGICAL HISTORY: Past Surgical History:  Procedure Laterality Date   ANKLE ARTHROSCOPY Right 08/25/2022   Procedure: RIGHT ANKLE ARTHROSCOPY;  Surgeon: Timothy Ford, MD;  Location: [facility] OR;  Service: Orthopedics;  Laterality: Right;   ANKLE FRACTURE SURGERY     1994, 1996   CHOLECYSTECTOMY     1998   COLONOSCOPY WITH PROPOFOL  N/A 06/14/2018   Procedure: COLONOSCOPY WITH BIOPSY;  Surgeon: Marnee Sink, MD;  Location: [facility];  Service: Endoscopy;  Laterality: N/A;   DILATION AND CURETTAGE OF UTERUS     1994, 2001   ESOPHAGOGASTRODUODENOSCOPY (EGD) WITH PROPOFOL  N/A 06/14/2018   Procedure: ESOPHAGOGASTRODUODENOSCOPY (EGD) WITH PROPOFOL ;  Surgeon: Marnee Sink, MD;  Location: [facility];  Service: Endoscopy;  Laterality: N/A;  FOREIGN BODY REMOVAL N/A 06/14/2018   Procedure: FOREIGN BODY REMOVAL;  Surgeon: Marnee Sink, MD;  Location: [facility];  Service: Endoscopy;  Laterality: N/A;  Staple Removal from Stomach   GASTRIC BYPASS     2012, revision 2013   KNEE ARTHROSCOPY     2011, 2013, 2015   KNEE SURGERY Right 09/21/2021   POLYPECTOMY N/A 06/14/2018   Procedure: POLYPECTOMY;  Surgeon: Marnee Sink, MD;  Location: [facility];  Service: Endoscopy;  Laterality: N/A;   TOOTH EXTRACTION     2010   TOTAL KNEE ARTHROPLASTY Left 05/25/2015   Procedure: LEFT TOTAL KNEE ARTHROPLASTY;  Surgeon: Saundra Curl, MD;  Location: [facility] OR;  Service: Orthopedics;  Laterality: Left;   TUBAL LIGATION     2004    ALLERGIES: Allergies  Allergen Reactions   Paxil [Paroxetine Hcl] Anaphylaxis    Throat swelled shut   Penicillins Hives and Itching    Had cefazolin  2017, 2024    FAMILY HISTORY: Family History  Problem Relation Age of Onset   Stroke Mother    Heart attack Mother    AAA (abdominal aortic aneurysm) Mother    Kidney cancer  Mother    Atrial fibrillation Mother    Diabetes Father    Dementia Father    Breast cancer Sister 18   Leukemia Sister    Breast cancer Maternal Grandmother        late 50's   Breast cancer Maternal Aunt 69       BRACA + dx twice age 81 and 76   Arthritis Other        parent   Hyperlipidemia Other        parent   Stroke Other        parent   Hypertension Other        parent   Kidney disease Other        parent, grandparent   Diabetes Other        parent, grandparent   Renal cancer Other        parent    SOCIAL HISTORY: Social History   Tobacco Use   Smoking status: Never    Passive exposure: Current   Smokeless tobacco: Never  Vaping Use   Vaping status: Never Used  Substance Use Topics   Alcohol use: No    Alcohol/week: 0.0 standard drinks of alcohol   Drug use: No   Social History   Social History Narrative   Are you right handed or left handed? right   Are you currently employed ? no   What is your current occupation?   Do you live at home alone?   Who lives with you? family   What type of home do you live in: 1 story or 2 story? two   Caffiene 2 soda a day       Objective:  Vital Signs:  LMP 08/24/2018 (Exact Date)   ***  Labs and Imaging review: No new results***  Previously reviewed results: TSH (02/02/23): wnl   11/01/22: Zinc  wnl HbA1c: 5.5   07/31/22: CMET unremarkable Lipid panel: tChol 179, LDL 96, TG 170 Vit D low at 14.52 TSH wnl B1 wnl B12: 300 CBC significant for MCV of 98.2 Ferritin 67.1 Vit A wnl Vit E wnl Folate low at 2.1 Copper  wnl   05/19/22: CRP wnl ESR wnl dsDNA neg SSA, SSB neg Anti-SM ab neg RNP ab neg   12/08/21: CCP neg RF neg CK: 60  Imaging: MRI brain wo contrast (12/16/21): FINDINGS: Brain: No restricted diffusion to suggest acute or subacute infarct.No acute hemorrhage, mass, mass effect, or midline shift. No hydrocephalus or extra-axial collection.No hemosiderin deposition to suggest  remote hemorrhage.   Vascular: Patent arterial flow voids.   Skull and upper cervical spine: Normal marrow signal.   Sinuses/Orbits: Minimal mucosal thickening in the right maxillary sinus. Otherwise clear paranasal sinuses. The orbits are unremarkable.   Other: The mastoid air cells are well aerated.   IMPRESSION: No acute intracranial process. No etiology is seen for the patient's headaches.   MRI cervical spine wo contrast (01/18/18): FINDINGS: Alignment: Straightening of the normal cervical lordosis. No listhesis or malalignment.   Vertebrae: Vertebral body heights well maintained without evidence for acute or chronic fracture. Bone marrow signal intensity within normal limits. No discrete or worrisome osseous lesions. No abnormal marrow edema.   Cord: Signal intensity within the cervical spinal cord is normal. No cord signal abnormality to suggest demyelinating disease. Normal cord caliber and morphology.   Posterior Fossa, vertebral arteries, paraspinal tissues: Visualized brain within normal limits. Craniocervical junction normal. Paraspinous and prevertebral soft tissues within normal limits. Normal intravascular flow voids seen within the vertebral arteries bilaterally.   Disc levels:   C2-C3: Unremarkable.   C3-C4:  Unremarkable.   C4-C5:  Unremarkable.   C5-C6:  Unremarkable.   C6-C7: Mild annular disc bulge. No significant canal or foraminal stenosis.   C7-T1:  Unremarkable.   Visualized upper thoracic spine is unremarkable.   IMPRESSION: 1. Normal MRI appearance of the cervical spinal cord. No findings to suggest demyelinating disease or other abnormality. 2. Minor noncompressive disc bulging at C6-7 without stenosis or impingement. Otherwise unremarkable MRI of the cervical spine.   EMG of LUE (10/04/2018 by Dr. Walden Guise): Impression: Normal study. There is no electrodiagnostic evidence of a large fiber neuropathy or cervical radiculopathy in the  left upper extremity.   Assessment/Plan:  This is Whitney Hill, a 53 y.o. female with: ***   Plan: ***  Return to clinic in ***  Total time spent reviewing records, interview, history/exam, documentation, and coordination of care on day of encounter:  *** min  Rommie Coats, MD

## 2023-06-07 ENCOUNTER — Ambulatory Visit: Payer: Self-pay | Admitting: Neurology

## 2023-06-15 ENCOUNTER — Other Ambulatory Visit (HOSPITAL_BASED_OUTPATIENT_CLINIC_OR_DEPARTMENT_OTHER): Payer: Self-pay

## 2023-06-18 ENCOUNTER — Other Ambulatory Visit (HOSPITAL_BASED_OUTPATIENT_CLINIC_OR_DEPARTMENT_OTHER): Payer: Self-pay

## 2023-06-18 ENCOUNTER — Encounter: Payer: Self-pay | Admitting: Internal Medicine

## 2023-06-25 ENCOUNTER — Other Ambulatory Visit (HOSPITAL_BASED_OUTPATIENT_CLINIC_OR_DEPARTMENT_OTHER): Payer: Self-pay

## 2023-06-25 ENCOUNTER — Other Ambulatory Visit: Payer: Self-pay | Admitting: Neurology

## 2023-06-25 DIAGNOSIS — G43809 Other migraine, not intractable, without status migrainosus: Secondary | ICD-10-CM

## 2023-06-25 DIAGNOSIS — G43009 Migraine without aura, not intractable, without status migrainosus: Secondary | ICD-10-CM

## 2023-06-26 ENCOUNTER — Encounter: Payer: Self-pay | Admitting: Internal Medicine

## 2023-06-26 ENCOUNTER — Other Ambulatory Visit (HOSPITAL_BASED_OUTPATIENT_CLINIC_OR_DEPARTMENT_OTHER): Payer: Self-pay

## 2023-06-26 ENCOUNTER — Other Ambulatory Visit: Payer: Self-pay

## 2023-06-26 MED ORDER — ONDANSETRON HCL 4 MG PO TABS
4.0000 mg | ORAL_TABLET | Freq: Three times a day (TID) | ORAL | 3 refills | Status: DC | PRN
Start: 2023-06-26 — End: 2023-06-29
  Filled 2023-06-26: qty 20, 7d supply, fill #0

## 2023-06-27 ENCOUNTER — Other Ambulatory Visit (HOSPITAL_BASED_OUTPATIENT_CLINIC_OR_DEPARTMENT_OTHER): Payer: Self-pay

## 2023-06-27 ENCOUNTER — Other Ambulatory Visit: Payer: Self-pay | Admitting: Neurology

## 2023-06-27 DIAGNOSIS — G43009 Migraine without aura, not intractable, without status migrainosus: Secondary | ICD-10-CM

## 2023-06-28 ENCOUNTER — Other Ambulatory Visit (HOSPITAL_BASED_OUTPATIENT_CLINIC_OR_DEPARTMENT_OTHER): Payer: Self-pay

## 2023-06-28 ENCOUNTER — Encounter: Payer: Self-pay | Admitting: Internal Medicine

## 2023-06-28 MED ORDER — SUMATRIPTAN SUCCINATE 100 MG PO TABS
ORAL_TABLET | ORAL | 4 refills | Status: DC
Start: 2023-06-28 — End: 2023-06-29
  Filled 2023-06-28: qty 9, 4d supply, fill #0

## 2023-06-29 ENCOUNTER — Encounter: Payer: Self-pay | Admitting: Internal Medicine

## 2023-06-29 ENCOUNTER — Ambulatory Visit: Admitting: Neurology

## 2023-06-29 ENCOUNTER — Encounter: Payer: Self-pay | Admitting: Neurology

## 2023-06-29 ENCOUNTER — Other Ambulatory Visit: Payer: Self-pay

## 2023-06-29 ENCOUNTER — Other Ambulatory Visit (HOSPITAL_BASED_OUTPATIENT_CLINIC_OR_DEPARTMENT_OTHER): Payer: Self-pay

## 2023-06-29 VITALS — BP 100/60 | HR 73 | Ht 65.0 in | Wt 174.0 lb

## 2023-06-29 DIAGNOSIS — E559 Vitamin D deficiency, unspecified: Secondary | ICD-10-CM

## 2023-06-29 DIAGNOSIS — H53149 Visual discomfort, unspecified: Secondary | ICD-10-CM

## 2023-06-29 DIAGNOSIS — F40298 Other specified phobia: Secondary | ICD-10-CM | POA: Diagnosis not present

## 2023-06-29 DIAGNOSIS — G43809 Other migraine, not intractable, without status migrainosus: Secondary | ICD-10-CM

## 2023-06-29 DIAGNOSIS — R110 Nausea: Secondary | ICD-10-CM

## 2023-06-29 DIAGNOSIS — G43009 Migraine without aura, not intractable, without status migrainosus: Secondary | ICD-10-CM

## 2023-06-29 DIAGNOSIS — E538 Deficiency of other specified B group vitamins: Secondary | ICD-10-CM

## 2023-06-29 MED ORDER — SUMATRIPTAN SUCCINATE 100 MG PO TABS
ORAL_TABLET | ORAL | 4 refills | Status: DC
  Filled 2023-07-06: qty 9, 30d supply, fill #0
  Filled 2023-07-31: qty 9, 30d supply, fill #1
  Filled 2023-09-02: qty 9, 30d supply, fill #2

## 2023-06-29 MED ORDER — ONDANSETRON HCL 4 MG PO TABS
4.0000 mg | ORAL_TABLET | Freq: Three times a day (TID) | ORAL | 3 refills | Status: DC | PRN
Start: 2023-06-29 — End: 2023-10-30
  Filled 2023-07-06 – 2023-07-07 (×2): qty 20, 7d supply, fill #0
  Filled 2023-07-31: qty 20, 7d supply, fill #1
  Filled 2023-09-02: qty 20, 7d supply, fill #2
  Filled 2023-10-01: qty 20, 7d supply, fill #3

## 2023-06-29 MED ORDER — NORTRIPTYLINE HCL 10 MG PO CAPS
30.0000 mg | ORAL_CAPSULE | Freq: Every day | ORAL | 5 refills | Status: AC
  Filled 2023-06-29 – 2023-07-02 (×2): qty 90, 30d supply, fill #0
  Filled 2023-07-30: qty 90, 30d supply, fill #1
  Filled 2023-08-28: qty 90, 30d supply, fill #2
  Filled 2023-09-27: qty 90, 30d supply, fill #3
  Filled 2023-10-26 – 2023-11-12 (×2): qty 90, 30d supply, fill #4
  Filled 2023-12-06 – 2024-01-10 (×4): qty 90, 30d supply, fill #5

## 2023-06-29 NOTE — Progress Notes (Signed)
 NEUROLOGY FOLLOW UP OFFICE NOTE  Whitney Hill [id number]  Subjective:  Whitney Hill is a 53 y.o. year old right-handed female with a medical history of hypothyroidism, migraines, iron  deficiency anemia, OA, gastric bypass surgery, thiamine  deficiency, B12 deficiency, vit D deficiency, folate deficiency who we last saw on 02/14/23 for headaches.  To briefly review: 02/14/23: Patient has a chronic headache for many years. It became more severe about 2 years ago. She would mainly have one when waking up. It is usually right sided around the eye and back of the head, but can be on the left side. It was occurred every day and would last all day. She took tylenol , but this did nothing. Her daughter has bad migraines and gave her a Imitrex  100 mg and this got rid of the headache. She endorses photophobia, phonophobia, and nausea, but no vomiting. She denies any recent vision changes or vision loss.   She would get up and move around and sometimes headache would improve. She can now get a headache at any time. She does not think laying down makes her headache worse. She does not think she snores. She feels well rested in the morning. She is not tired throughout the day.   She then spoke with her PCP. She was started on Imitrex  50 mg PRN about 2 years ago. This helped, but she was having to take all 9 every month. She does not usually take at headache They improved but increased in frequency about 3-4 months ago. She was started on Topamax  25 mg daily on 11/01/22 by PCP. Patient took it about 3 weeks but stopped because it made all food and drink taste horrible. Taste returned to normal after stopping it.    Her current headaches are 7/10 but were 10/10 about 2 years ago. She will have 2-3 headaches per week currently.   Current medications: -Imitrex  50 mg PRN - almost always helps, maybe not always completely resolved though -Zofran  PRN for nausea   Past medications:  -Topamax  - stopped due  to abnormal taste   Caffeine use: 2 sodas per day Smoker: Never EtOH use: None Restrictive diet: No Family history of neurologic disease: Daughter and son with migraines   Of note, patient has seen neurology in the past for weakness and vision changes and found to have B1 deficiency. She also gets B12 injections.   Of note, patient has had 12 ankle or leg surgeries due to fractures and OA.  Most recent Assessment and Plan (02/14/23): Whitney Hill is a 53 y.o. female who presents for evaluation of headaches. She has a relevant medical history of hypothyroidism, migraines, iron  deficiency anemia, OA, gastric bypass surgery, thiamine  deficiency, B12 deficiency, vit D deficiency, folate deficiency. Her neurological examination is essentially normal today. Patient's symptoms are most consistent with episodic migraine without aura. She has 8-12 migraines per month currently. She has previously taken Topamax , but was unable to tolerate it (metallic taste).   PLAN: -Continue folate, vit D, and B12 supplementation -For migraines: Migraine prevention: Nortriptyline  10 mg at bedtime for 1 week then increase to 20 mg at bedtime Migraine rescue:  Sumatriptan  100 mg as needed at headache onset, can repeat in 2 hours if needed. Zofran  as needed for nausea Limit use of pain relievers to no more than 2 days out of week to prevent risk of rebound or medication-overuse headache. Keep headache diary  Since their last visit: Patient was doing well in terms of headaches until recently.  She was having maybe one headache per week. She went to [place] for a month and did not have Nortriptyline  (mid 02/2023 to end of 03/2023). When she came back, she was having 1 headache a week, that she would wake up with. They would take Sumatriptan  that would resolve the headache. She was then in [place] and still doing okay.  In the last week, she has had more headaches. She came back from [place] and a week later headaches got  worse. She has had a headache essentially everyday. Sumatriptan  did not work on one of the headaches until she took it twice.  She denies significant jet lag. She thinks increased humidity and rain the past week may be contributing.  She has no new complaints.  MEDICATIONS:  Outpatient Encounter Medications as of 06/29/2023  Medication Sig Note   cyanocobalamin  (VITAMIN B12) 1000 MCG/ML injection Inject 1 mL (1000 mcg total) into the muscle once weekly for 4 weeks and then once monthly    cyclobenzaprine  (FLEXERIL ) 5 MG tablet Take 1 tablet (5 mg total) by mouth 3 (three) times daily as needed (muscle pain).    diclofenac  sodium (VOLTAREN ) 1 % GEL Apply 2 g topically 4 (four) times daily as needed (pain.).    EPINEPHrine  0.3 mg/0.3 mL IJ SOAJ injection Inject 0.3 mg into the muscle as needed for anaphylaxis. 08/25/2022: Has never used   Folic Acid  5 MG CAPS Take 5 mg by mouth daily. 08/18/2022: New supplement patient will start post operatively   levothyroxine  (SYNTHROID ) 150 MCG tablet TAKE 1 TABLET BY MOUTH EVERY DAY BEFORE BREAKFAST    multivitamin-iron -minerals-folic acid  (CENTRUM) chewable tablet Chew 1 tablet by mouth daily.    nortriptyline  (PAMELOR ) 10 MG capsule Take 1 capsule (10 mg) at bedtime for 1 week, then increase to 2 capsules (20 mg) at bedtime thereafter    nystatin  (MYCOSTATIN /NYSTOP ) powder Apply 1 Application topically 3 (three) times daily as needed (candidal intertrigo).    ondansetron  (ZOFRAN ) 4 MG tablet Take 1 tablet (4 mg total) by mouth every 8 (eight) hours as needed for nausea or vomiting.    pantoprazole  (PROTONIX ) 40 MG tablet Take 1 tablet (40 mg total) by mouth daily.    SUMAtriptan  (IMITREX ) 100 MG tablet Take as directed.  Maximum of 2 tablets (200 mg) in 24 hours.    Syringe/Needle, Disp, (SYRINGE 3CC/25GX1) 25G X 1 3 ML MISC Use with b12 injections    tirzepatide  (ZEPBOUND ) 12.5 MG/0.5ML Pen Inject 12.5 mg into the skin once a week. (Patient taking  differently: Inject 10 mg into the skin once a week.)    Vitamin D , Ergocalciferol , (DRISDOL ) 1.25 MG (50000 UNIT) CAPS capsule Take 1 capsule (50,000 Units total) by mouth every 7 (seven) days.    predniSONE  (DELTASONE ) 10 MG tablet Take 1 tablet (10 mg total) by mouth daily with breakfast. (Patient not taking: Reported on 06/29/2023)    No facility-administered encounter medications on file as of 06/29/2023.    PAST MEDICAL HISTORY: Past Medical History:  Diagnosis Date   Arthritis    OA   Complication of anesthesia    ITCHING TO FACE, BENADRYL  HELP BUT DROPS BLOOD PRESSURE   DVT (deep venous thrombosis) (HCC)    following first knee surgery in 2011   Gastric ulcer    Hashimoto's disease 09/18/2017   Headache    Heterozygous factor V Leiden mutation (HCC)    Hives    chronic   Hypothyroid    Iron  deficiency anemia  Migraine     PAST SURGICAL HISTORY: Past Surgical History:  Procedure Laterality Date   ANKLE ARTHROSCOPY Right 08/25/2022   Procedure: RIGHT ANKLE ARTHROSCOPY;  Surgeon: Timothy Ford, MD;  Location: [facility] OR;  Service: Orthopedics;  Laterality: Right;   ANKLE FRACTURE SURGERY     1994, 1996   CHOLECYSTECTOMY     1998   COLONOSCOPY WITH PROPOFOL  N/A 06/14/2018   Procedure: COLONOSCOPY WITH BIOPSY;  Surgeon: Marnee Sink, MD;  Location: [facility];  Service: Endoscopy;  Laterality: N/A;   DILATION AND CURETTAGE OF UTERUS     1994, 2001   ESOPHAGOGASTRODUODENOSCOPY (EGD) WITH PROPOFOL  N/A 06/14/2018   Procedure: ESOPHAGOGASTRODUODENOSCOPY (EGD) WITH PROPOFOL ;  Surgeon: Marnee Sink, MD;  Location: [facility];  Service: Endoscopy;  Laterality: N/A;   FOREIGN BODY REMOVAL N/A 06/14/2018   Procedure: FOREIGN BODY REMOVAL;  Surgeon: Marnee Sink, MD;  Location: [facility];  Service: Endoscopy;  Laterality: N/A;  Staple Removal from Stomach   GASTRIC BYPASS     2012, revision 2013   KNEE ARTHROSCOPY     2011, 2013, 2015   KNEE SURGERY  Right 09/21/2021   POLYPECTOMY N/A 06/14/2018   Procedure: POLYPECTOMY;  Surgeon: Marnee Sink, MD;  Location: [facility];  Service: Endoscopy;  Laterality: N/A;   TOOTH EXTRACTION     2010   TOTAL KNEE ARTHROPLASTY Left 05/25/2015   Procedure: LEFT TOTAL KNEE ARTHROPLASTY;  Surgeon: Saundra Curl, MD;  Location: [facility] OR;  Service: Orthopedics;  Laterality: Left;   TUBAL LIGATION     2004    ALLERGIES: Allergies  Allergen Reactions   Paxil [Paroxetine Hcl] Anaphylaxis    Throat swelled shut   Penicillins Hives and Itching    Had cefazolin  2017, 2024    FAMILY HISTORY: Family History  Problem Relation Age of Onset   Stroke Mother    Heart attack Mother    AAA (abdominal aortic aneurysm) Mother    Kidney cancer Mother    Atrial fibrillation Mother    Diabetes Father    Dementia Father    Breast cancer Sister 38   Leukemia Sister    Breast cancer Maternal Grandmother        late 50's   Breast cancer Maternal Aunt 62       BRACA + dx twice age 56 and 50   Arthritis Other        parent   Hyperlipidemia Other        parent   Stroke Other        parent   Hypertension Other        parent   Kidney disease Other        parent, grandparent   Diabetes Other        parent, grandparent   Renal cancer Other        parent    SOCIAL HISTORY: Social History   Tobacco Use   Smoking status: Never    Passive exposure: Current   Smokeless tobacco: Never  Vaping Use   Vaping status: Never Used  Substance Use Topics   Alcohol use: No    Alcohol/week: 0.0 standard drinks of alcohol   Drug use: No   Social History   Social History Narrative   Are you right handed or left handed? right   Are you currently employed ? no   What is your current occupation?   Do you live at home alone?   Who lives with you?  family   What type of home do you live in: 1 story or 2 story? two   Caffiene 2 soda a day       Objective:  Vital Signs:  BP 100/60   Pulse 73   Ht  5' 5 (1.651 m)   Wt 174 lb (78.9 kg)   LMP 08/24/2018 (Exact Date)   SpO2 97%   BMI 28.96 kg/m   General: No acute distress.  Patient appears well-groomed.   Head:  Normocephalic/atraumatic Eyes:  Fundi examined, disc margins clear, no obvious papilledema Neck: supple, no paraspinal tenderness, full range of motion Heart:  Regular rate and rhythm Lungs:  Clear to auscultation bilaterally Back: No paraspinal tenderness Neurological Exam: alert and oriented.  Speech fluent and not dysarthric, language intact.  CN II-XII intact. Bulk and tone normal, muscle strength 5/5 throughout.  Sensation to light touch intact.  Deep tendon reflexes 2+ throughout, toes downgoing.  Finger to nose testing intact.  Gait normal, Romberg negative.   Labs and Imaging review: No new results  Previously reviewed results: TSH (02/02/23): wnl   11/01/22: Zinc  wnl HbA1c: 5.5   07/31/22: CMET unremarkable Lipid panel: tChol 179, LDL 96, TG 170 Vit D low at 14.52 TSH wnl B1 wnl B12: 300 CBC significant for MCV of 98.2 Ferritin 67.1 Vit A wnl Vit E wnl Folate low at 2.1 Copper  wnl   05/19/22: CRP wnl ESR wnl dsDNA neg SSA, SSB neg Anti-SM ab neg RNP ab neg   12/08/21: CCP neg RF neg CK: 60   Imaging: MRI brain wo contrast (12/16/21): FINDINGS: Brain: No restricted diffusion to suggest acute or subacute infarct.No acute hemorrhage, mass, mass effect, or midline shift. No hydrocephalus or extra-axial collection.No hemosiderin deposition to suggest remote hemorrhage.   Vascular: Patent arterial flow voids.   Skull and upper cervical spine: Normal marrow signal.   Sinuses/Orbits: Minimal mucosal thickening in the right maxillary sinus. Otherwise clear paranasal sinuses. The orbits are unremarkable.   Other: The mastoid air cells are well aerated.   IMPRESSION: No acute intracranial process. No etiology is seen for the patient's headaches.   MRI cervical spine wo contrast  (01/18/18): FINDINGS: Alignment: Straightening of the normal cervical lordosis. No listhesis or malalignment.   Vertebrae: Vertebral body heights well maintained without evidence for acute or chronic fracture. Bone marrow signal intensity within normal limits. No discrete or worrisome osseous lesions. No abnormal marrow edema.   Cord: Signal intensity within the cervical spinal cord is normal. No cord signal abnormality to suggest demyelinating disease. Normal cord caliber and morphology.   Posterior Fossa, vertebral arteries, paraspinal tissues: Visualized brain within normal limits. Craniocervical junction normal. Paraspinous and prevertebral soft tissues within normal limits. Normal intravascular flow voids seen within the vertebral arteries bilaterally.   Disc levels:   C2-C3: Unremarkable.   C3-C4:  Unremarkable.   C4-C5:  Unremarkable.   C5-C6:  Unremarkable.   C6-C7: Mild annular disc bulge. No significant canal or foraminal stenosis.   C7-T1:  Unremarkable.   Visualized upper thoracic spine is unremarkable.   IMPRESSION: 1. Normal MRI appearance of the cervical spinal cord. No findings to suggest demyelinating disease or other abnormality. 2. Minor noncompressive disc bulging at C6-7 without stenosis or impingement. Otherwise unremarkable MRI of the cervical spine.   EMG of LUE (10/04/2018 by Dr. Walden Guise): Impression: Normal study. There is no electrodiagnostic evidence of a large fiber neuropathy or cervical radiculopathy in the left upper extremity.   Assessment/Plan:  This is  Whitney Hill, a 53 y.o. female with: Migraine without aura: was doing well with only 1 headache per week until the past week when she has had daily headaches. This could be due to a lot of recent travel to different time zones/climates and those associated changes or due to heavy rain and humidity over the last week. Hopefully an increase in nortriptyline  will again reduce her  headache frequency.  B12 deficiency Vit D deficiency Folate deficiency  Plan: Migraine prevention:  Increase nortriptyline  to 30 mg at bedtime Migraine rescue:  Continue sumatriptan  100 mg as needed at headache, can repeat in 2 hours if needed, zofran  ODT PRN for nausea Limit use of pain relievers to no more than 2 days out of week to prevent risk of rebound or medication-overuse headache. Keep headache diary  -Continue B12, vit D, folic acid   Return to clinic in 6 months  Rommie Coats, MD

## 2023-06-29 NOTE — Patient Instructions (Signed)
 Migraine prevention:  Increase nortriptyline  to 30 mg at bedtime Migraine rescue:  Continue sumatriptan  100 mg as needed at headache, can repeat in 2 hours if needed, zofran  as needed for nausea Limit use of pain relievers to no more than 2 days out of week to prevent risk of rebound or medication-overuse headache. Keep headache diary  Continue B12, vit D, and folic acid .  I will see you again in about 6 months. Please let me know if you have any questions or concerns in the meantime.  The physicians and staff at [facility] are committed to providing excellent care. You may receive a survey requesting feedback about your experience at our office. We strive to receive very good responses to the survey questions. If you feel that your experience would prevent you from giving the office a very good  response, please contact our office to try to remedy the situation. We may be reached at [phone]. Thank you for taking the time out of your busy day to complete the survey.  Rommie Coats, MD [facility]  More migraine information: Be aware of common food triggers:  - Caffeine:  coffee, black tea, cola, Mt. Dew  - Chocolate  - Dairy:  aged cheeses (brie, blue, cheddar, gouda, Parmasan, provolone, romano, Swiss, etc), chocolate milk, buttermilk, sour cream, limit eggs and yogurt  - Nuts, peanut butter  - Alcohol  - Cereals/grains:  FRESH breads (fresh bagels, sourdough, doughnuts), yeast productions  - Processed/canned/aged/cured meats (pre-packaged deli meats, hotdogs)  - MSG/glutamate:  soy sauce, flavor enhancer, pickled/preserved/marinated foods  - Sweeteners:  aspartame (Equal, Nutrasweet).  Sugar and Splenda are okay  - Vegetables:  legumes (lima beans, lentils, snow peas, fava beans, pinto peans, peas, garbanzo beans), sauerkraut, onions, olives, pickles  - Fruit:  avocados, bananas, citrus fruit (orange, lemon, grapefruit), mango  - Other:  Frozen meals, macaroni and  cheese Routine exercise Stay adequately hydrated (aim for 64 oz water  daily) Keep headache diary Maintain proper stress management Maintain proper sleep hygiene Do not skip meals Consider supplements:  magnesium  citrate 400mg  daily, riboflavin 400mg  daily, coenzyme Q10 100mg  three times daily.

## 2023-07-02 ENCOUNTER — Encounter: Payer: Self-pay | Admitting: Internal Medicine

## 2023-07-02 ENCOUNTER — Other Ambulatory Visit (HOSPITAL_BASED_OUTPATIENT_CLINIC_OR_DEPARTMENT_OTHER): Payer: Self-pay

## 2023-07-06 ENCOUNTER — Other Ambulatory Visit (HOSPITAL_BASED_OUTPATIENT_CLINIC_OR_DEPARTMENT_OTHER): Payer: Self-pay

## 2023-07-07 ENCOUNTER — Other Ambulatory Visit (HOSPITAL_BASED_OUTPATIENT_CLINIC_OR_DEPARTMENT_OTHER): Payer: Self-pay

## 2023-07-07 ENCOUNTER — Telehealth: Admitting: Nurse Practitioner

## 2023-07-07 DIAGNOSIS — G43009 Migraine without aura, not intractable, without status migrainosus: Secondary | ICD-10-CM

## 2023-07-07 NOTE — Progress Notes (Signed)
 Virtual Visit Consent   Whitney Hill, you are scheduled for a virtual visit with a [facility] provider today. Just as with appointments in the office, your consent must be obtained to participate. Your consent will be active for this visit and any virtual visit you may have with one of our providers in the next 365 days. If you have a MyChart account, a copy of this consent can be sent to you electronically.  As this is a virtual visit, video technology does not allow for your provider to perform a traditional examination. This may limit your provider's ability to fully assess your condition. If your provider identifies any concerns that need to be evaluated in person or the need to arrange testing (such as labs, EKG, etc.), we will make arrangements to do so. Although advances in technology are sophisticated, we cannot ensure that it will always work on either your end or our end. If the connection with a video visit is poor, the visit may have to be switched to a telephone visit. With either a video or telephone visit, we are not always able to ensure that we have a secure connection.  By engaging in this virtual visit, you consent to the provision of healthcare and authorize for your insurance to be billed (if applicable) for the services provided during this visit. Depending on your insurance coverage, you may receive a charge related to this service.  I need to obtain your verbal consent now. Are you willing to proceed with your visit today? Whitney Hill has provided verbal consent on 07/07/2023 for a virtual visit (video or telephone). Haze LELON Servant, NP  Date: 07/07/2023 4:16 PM   Virtual Visit via Video Note   I, Haze LELON Servant, connected with  Whitney Hill  ([id number], [birth year]) on 07/07/23 at  4:00 PM EDT by a video-enabled telemedicine application and verified that I am speaking with the correct person using two identifiers.  Location: Patient: Virtual Visit  Location Patient: Home Provider: Virtual Visit Location Provider: Home Office   I discussed the limitations of evaluation and management by telemedicine and the availability of in person appointments. The patient expressed understanding and agreed to proceed.    History of Present Illness: Whitney Hill is a 53 y.o. who identifies as a female who was assigned female at birth, and is being seen today for migraines.  Whitney Hill is currently being managed by neurology for migraines.  Her nortriptyline  was recently increased to 30 mg and she also has Imitrex  to take as needed.  She states she has been experiencing cluster headaches over the past few days and has been taking Imitrex  daily.  She is concerned that she is having to take more than 1 dose of Imitrex  to relieve her headaches.  She does note relief of her headaches usually after taking 1 Imitrex  but she is concerned that she has been taking Imitrex  more frequently. She would like to know if prednisone  can be prescribed today for her headaches.   Problems:  Patient Active Problem List   Diagnosis Date Noted   Candidal intertrigo 02/02/2023   Plantar fasciitis 02/02/2023   Chronic gastric ulcer without hemorrhage and without perforation 11/01/2022   Migraine 11/01/2022   Traumatic arthritis of right ankle 08/25/2022   Fibromyalgia 06/12/2022   Positive ANA (antinuclear antibody) 05/19/2022   Anxiety and depression 01/23/2022   Breast pain, left 12/08/2021   New onset of headaches after age 34 12/08/2021   Endometrial  polyp 06/08/2021   Rib pain on left side 06/08/2021   History of gastric ulcer 06/08/2021   Urine frequency 06/08/2021   Post-traumatic osteoarthritis of right ankle 04/08/2021   Encounter for general adult medical examination with abnormal findings 12/15/2020   History of COVID-19 12/15/2020   Trochanteric bursitis, left hip 10/08/2020   Proteinuria 01/19/2020   Arthritis of right knee 08/21/2019   Nocturia  08/05/2019   History of abnormal cervical Pap smear 07/09/2018   Polyp of sigmoid colon    Hypokalemia 02/05/2018   Thiamine  deficiency 01/17/2018   Arthralgia 09/18/2017   Tick bite 09/18/2017   Vitamin D  deficiency 09/18/2017   Vision loss 09/18/2017   Constipation 04/28/2016   Right ankle pain 04/28/2016   Abnormal uterine bleeding 02/07/2016   Iron  deficiency 02/04/2016   Special screening for malignant neoplasms, colon 10/14/2015   Skin nodule 09/20/2015   Status post left partial knee replacement, medial aspect 07/09/2015   Obesity (BMI 30.0-34.9) 07/09/2015   Iron  deficiency anemia 01/14/2015   Hypothyroidism 10/01/2014   Cramps, extremity 10/01/2014   Allergic rhinitis 10/01/2014   S/P gastric bypass 10/01/2014    Allergies:  Allergies  Allergen Reactions   Paxil [Paroxetine Hcl] Anaphylaxis    Throat swelled shut   Penicillins Hives and Itching    Had cefazolin  2017, 2024   Medications:  Current Outpatient Medications:    cyanocobalamin  (VITAMIN B12) 1000 MCG/ML injection, Inject 1 mL (1000 mcg total) into the muscle once weekly for 4 weeks and then once monthly, Disp: 3 mL, Rfl: 3   cyclobenzaprine  (FLEXERIL ) 5 MG tablet, Take 1 tablet (5 mg total) by mouth 3 (three) times daily as needed (muscle pain)., Disp: 30 tablet, Rfl: 1   diclofenac  sodium (VOLTAREN ) 1 % GEL, Apply 2 g topically 4 (four) times daily as needed (pain.)., Disp: , Rfl:    EPINEPHrine  0.3 mg/0.3 mL IJ SOAJ injection, Inject 0.3 mg into the muscle as needed for anaphylaxis., Disp: 2 each, Rfl: 0   Folic Acid  5 MG CAPS, Take 5 mg by mouth daily., Disp: , Rfl:    levothyroxine  (SYNTHROID ) 150 MCG tablet, TAKE 1 TABLET BY MOUTH EVERY DAY BEFORE BREAKFAST, Disp: 30 tablet, Rfl: 2   multivitamin-iron -minerals-folic acid  (CENTRUM) chewable tablet, Chew 1 tablet by mouth daily., Disp: , Rfl:    nortriptyline  (PAMELOR ) 10 MG capsule, Take 3 capsules (30 mg total) by mouth at bedtime., Disp: 90 capsule,  Rfl: 5   nystatin  (MYCOSTATIN /NYSTOP ) powder, Apply 1 Application topically 3 (three) times daily as needed (candidal intertrigo)., Disp: 60 g, Rfl: 0   ondansetron  (ZOFRAN ) 4 MG tablet, Take 1 tablet (4 mg total) by mouth every 8 (eight) hours as needed for nausea or vomiting., Disp: 20 tablet, Rfl: 3   pantoprazole  (PROTONIX ) 40 MG tablet, Take 1 tablet (40 mg total) by mouth daily., Disp: 30 tablet, Rfl: 3   predniSONE  (DELTASONE ) 10 MG tablet, Take 1 tablet (10 mg total) by mouth daily with breakfast. (Patient not taking: Reported on 06/29/2023), Disp: 30 tablet, Rfl: 0   SUMAtriptan  (IMITREX ) 100 MG tablet, Take as directed.  Maximum of 2 tablets (200 mg) in 24 hours., Disp: 9 tablet, Rfl: 4   Syringe/Needle, Disp, (SYRINGE 3CC/25GX1) 25G X 1 3 ML MISC, Use with b12 injections, Disp: 50 each, Rfl: 0   tirzepatide  (ZEPBOUND ) 12.5 MG/0.5ML Pen, Inject 12.5 mg into the skin once a week. (Patient taking differently: Inject 10 mg into the skin once a week.), Disp: 6 mL, Rfl:  0   Vitamin D , Ergocalciferol , (DRISDOL ) 1.25 MG (50000 UNIT) CAPS capsule, Take 1 capsule (50,000 Units total) by mouth every 7 (seven) days., Disp: 12 capsule, Rfl: 1  Observations/Objective: Patient is well-developed, well-nourished in no acute distress.  Resting comfortably at home.  Head is normocephalic, atraumatic.  No labored breathing.  Speech is clear and coherent with logical content.  Patient is alert and oriented at baseline.    Assessment and Plan: 1. Migraine without aura and without status migrainosus, not intractable (Primary) Continue Imitrex  as prescribed.  May take up to 2 tablets within 24 hours.  If having to take Imitrex  2 tablets daily for greater than 5 days needs to contact neurology for further recommendations. Unfortunately we were unable to prescribe prednisone  today.  I did instruct her this is usually used for emergency situations and only provides short-term relief.   Follow Up  Instructions: I discussed the assessment and treatment plan with the patient. The patient was provided an opportunity to ask questions and all were answered. The patient agreed with the plan and demonstrated an understanding of the instructions.  A copy of instructions were sent to the patient via MyChart unless otherwise noted below.    The patient was advised to call back or seek an in-person evaluation if the symptoms worsen or if the condition fails to improve as anticipated.    Lun Muro W Prabhnoor Ellenberger, NP

## 2023-07-07 NOTE — Patient Instructions (Signed)
 Whitney Hill, thank you for joining Haze LELON Servant, NP for today's virtual visit.  While this provider is not your primary care provider (PCP), if your PCP is located in our provider database this encounter information will be shared with them immediately following your visit.   A [facility] MyChart account gives you access to today's visit and all your visits, tests, and labs performed at [facility]  click here if you don't have a  MyChart account or go to [email or web address]  Consent: (Patient) Whitney Hill provided verbal consent for this virtual visit at the beginning of the encounter.  Current Medications:  Current Outpatient Medications:    cyanocobalamin  (VITAMIN B12) 1000 MCG/ML injection, Inject 1 mL (1000 mcg total) into the muscle once weekly for 4 weeks and then once monthly, Disp: 3 mL, Rfl: 3   cyclobenzaprine  (FLEXERIL ) 5 MG tablet, Take 1 tablet (5 mg total) by mouth 3 (three) times daily as needed (muscle pain)., Disp: 30 tablet, Rfl: 1   diclofenac  sodium (VOLTAREN ) 1 % GEL, Apply 2 g topically 4 (four) times daily as needed (pain.)., Disp: , Rfl:    EPINEPHrine  0.3 mg/0.3 mL IJ SOAJ injection, Inject 0.3 mg into the muscle as needed for anaphylaxis., Disp: 2 each, Rfl: 0   Folic Acid  5 MG CAPS, Take 5 mg by mouth daily., Disp: , Rfl:    levothyroxine  (SYNTHROID ) 150 MCG tablet, TAKE 1 TABLET BY MOUTH EVERY DAY BEFORE BREAKFAST, Disp: 30 tablet, Rfl: 2   multivitamin-iron -minerals-folic acid  (CENTRUM) chewable tablet, Chew 1 tablet by mouth daily., Disp: , Rfl:    nortriptyline  (PAMELOR ) 10 MG capsule, Take 3 capsules (30 mg total) by mouth at bedtime., Disp: 90 capsule, Rfl: 5   nystatin  (MYCOSTATIN /NYSTOP ) powder, Apply 1 Application topically 3 (three) times daily as needed (candidal intertrigo)., Disp: 60 g, Rfl: 0   ondansetron  (ZOFRAN ) 4 MG tablet, Take 1 tablet (4 mg total) by mouth every 8 (eight) hours as needed for nausea  or vomiting., Disp: 20 tablet, Rfl: 3   pantoprazole  (PROTONIX ) 40 MG tablet, Take 1 tablet (40 mg total) by mouth daily., Disp: 30 tablet, Rfl: 3   predniSONE  (DELTASONE ) 10 MG tablet, Take 1 tablet (10 mg total) by mouth daily with breakfast. (Patient not taking: Reported on 06/29/2023), Disp: 30 tablet, Rfl: 0   SUMAtriptan  (IMITREX ) 100 MG tablet, Take as directed.  Maximum of 2 tablets (200 mg) in 24 hours., Disp: 9 tablet, Rfl: 4   Syringe/Needle, Disp, (SYRINGE 3CC/25GX1) 25G X 1 3 ML MISC, Use with b12 injections, Disp: 50 each, Rfl: 0   tirzepatide  (ZEPBOUND ) 12.5 MG/0.5ML Pen, Inject 12.5 mg into the skin once a week. (Patient taking differently: Inject 10 mg into the skin once a week.), Disp: 6 mL, Rfl: 0   Vitamin D , Ergocalciferol , (DRISDOL ) 1.25 MG (50000 UNIT) CAPS capsule, Take 1 capsule (50,000 Units total) by mouth every 7 (seven) days., Disp: 12 capsule, Rfl: 1   Medications ordered in this encounter:  No orders of the defined types were placed in this encounter.    *If you need refills on other medications prior to your next appointment, please contact your pharmacy*  Follow-Up: Call back or seek an in-person evaluation if the symptoms worsen or if the condition fails to improve as anticipated.  [facility] [phone]  Other Instructions Continue Imitrex  as prescribed.  May take up to 2 tablets within 24 hours.  If having to take Imitrex  2 tablets  daily for greater than 5 days needs to contact neurology for further recommendations.   If you have been instructed to have an in-person evaluation today at a local Urgent Care facility, please use the link below. It will take you to a list of all of our available [facility], including address, phone number and hours of operation. Please do not delay care.  [facility]  If you or a family member do not have a primary care provider, use the link below to schedule a visit and establish  care. When you choose a [facility] primary care physician or advanced practice provider, you gain a long-term partner in health. Find a Primary Care Provider  Learn more about [facility]'s in-office and virtual care options: [facility] - Get Care Now

## 2023-07-09 ENCOUNTER — Other Ambulatory Visit (HOSPITAL_BASED_OUTPATIENT_CLINIC_OR_DEPARTMENT_OTHER): Payer: Self-pay

## 2023-08-10 ENCOUNTER — Ambulatory Visit: Admitting: Nurse Practitioner

## 2023-09-19 ENCOUNTER — Ambulatory Visit: Admitting: Nurse Practitioner

## 2023-09-20 ENCOUNTER — Other Ambulatory Visit: Payer: Self-pay

## 2023-09-20 DIAGNOSIS — G43009 Migraine without aura, not intractable, without status migrainosus: Secondary | ICD-10-CM

## 2023-09-20 MED ORDER — SUMATRIPTAN SUCCINATE 100 MG PO TABS: ORAL_TABLET | ORAL | 4 refills | Status: AC

## 2023-09-25 ENCOUNTER — Ambulatory Visit: Admitting: Orthopedic Surgery

## 2023-09-27 ENCOUNTER — Ambulatory Visit: Admitting: Orthopedic Surgery

## 2023-10-02 ENCOUNTER — Ambulatory Visit: Admitting: Orthopedic Surgery

## 2023-10-02 DIAGNOSIS — M19171 Post-traumatic osteoarthritis, right ankle and foot: Secondary | ICD-10-CM | POA: Diagnosis not present

## 2023-10-02 DIAGNOSIS — M1711 Unilateral primary osteoarthritis, right knee: Secondary | ICD-10-CM | POA: Diagnosis not present

## 2023-10-03 ENCOUNTER — Ambulatory Visit: Admitting: Nurse Practitioner

## 2023-10-07 ENCOUNTER — Encounter: Payer: Self-pay | Admitting: Orthopedic Surgery

## 2023-10-07 DIAGNOSIS — M19171 Post-traumatic osteoarthritis, right ankle and foot: Secondary | ICD-10-CM

## 2023-10-07 DIAGNOSIS — M1711 Unilateral primary osteoarthritis, right knee: Secondary | ICD-10-CM | POA: Diagnosis not present

## 2023-10-07 MED ORDER — LIDOCAINE HCL 1 % IJ SOLN
2.0000 mL | INTRAMUSCULAR | Status: AC | PRN
  Administered 2023-10-07: 2 mL

## 2023-10-07 MED ORDER — METHYLPREDNISOLONE ACETATE 40 MG/ML IJ SUSP
40.0000 mg | INTRAMUSCULAR | Status: AC | PRN
  Administered 2023-10-07: 40 mg via INTRA_ARTICULAR

## 2023-10-07 MED ORDER — LIDOCAINE HCL (PF) 1 % IJ SOLN
5.0000 mL | INTRAMUSCULAR | Status: AC | PRN
  Administered 2023-10-07: 5 mL

## 2023-10-07 NOTE — Progress Notes (Signed)
 Office Visit Note   Patient: Whitney Hill           Date of Birth: [birth year]           MRN: [id number] Visit Date: 10/02/2023              Requested by: Gretel App, NP [address] [place],  [place] [address] PCP: Gretel App, NP  Chief Complaint  Patient presents with   Right Ankle - Pain      HPI: Discussed the use of AI scribe software for clinical note transcription with the patient, who gave verbal consent to proceed.  History of Present Illness Whitney Hill is a 53 year old female who presents with right knee and ankle pain.  She has been experiencing recurrent right knee and ankle pain after a period of improvement. The symptoms had previously resolved following an injection but have recently returned.  She had no issues with her knee and ankle during a recent trip to [place], but the symptoms have since reappeared. It has been over three months since her last treatment.  She is planning a long drive to [place]  on Thursday to assist in moving her son and his family, which involves driving for approximately nineteen hours.  She lives in [place] , having moved from [place] . Her husband works in [place]  and commutes. She has family in [place]  and [place] , including grandchildren who will be moving closer to her.     Assessment & Plan: Visit Diagnoses:  1. Post-traumatic osteoarthritis of right ankle   2. Arthritis of right knee     Plan: Assessment and Plan Assessment & Plan Right knee pain with effusion Chronic right knee pain with mild effusion, tender over medial joint line. No redness or cellulitis. - Administer injection to right knee from lateral side to alleviate pain and effusion, minimizing nerve damage risk.  Right ankle pain with effusion Chronic right ankle pain with mild effusion, tender over lateral joint line. No redness or cellulitis. - Administer injection to right ankle to alleviate pain and effusion by  filling joint space.      Follow-Up Instructions: Return if symptoms worsen or fail to improve.   Ortho Exam  Patient is alert, oriented, no adenopathy, well-dressed, normal affect, normal respiratory effort. Physical Exam MUSCULOSKELETAL: Mild effusion of the right knee and ankle. Right knee tender to palpation over the medial joint line. Right ankle tender to palpation over the lateral joint line. SKIN: No redness or cellulitis.      Imaging: No results found. No images are attached to the encounter.  Labs: Lab Results  Component Value Date   HGBA1C 5.5 11/01/2022   HGBA1C 5.9 12/15/2020   HGBA1C 5.7 08/05/2019   ESRSEDRATE 11 05/19/2022   ESRSEDRATE 16 09/18/2017   CRP <3.0 05/19/2022   CRP 0.2 (L) 09/18/2017   REPTSTATUS 08/15/2019 FINAL 08/13/2019   CULT >=100,000 COLONIES/mL ESCHERICHIA COLI (A) 08/13/2019   LABORGA ESCHERICHIA COLI (A) 08/13/2019     Lab Results  Component Value Date   ALBUMIN 4.1 07/31/2022   ALBUMIN 4.2 12/08/2021   ALBUMIN 4.3 12/15/2020    Lab Results  Component Value Date   MG 2.0 01/18/2018   Lab Results  Component Value Date   VD25OH 14.52 (L) 07/31/2022   VD25OH 16.76 (L) 04/19/2022   VD25OH 8.75 (L) 01/23/2022    No results found for: PREALBUMIN    Latest Ref Rng & Units 07/31/2022   10:10 AM 12/08/2020  12:10 AM 10/22/2020    3:27 PM  CBC EXTENDED  WBC 4.0 - 10.5 K/uL 6.7  8.7  7.9   RBC 3.87 - 5.11 Mil/uL 4.15  4.15  4.02   Hemoglobin 12.0 - 15.0 g/dL 86.6  86.4  86.6   HCT 36.0 - 46.0 % 40.8  40.0  39.4   Platelets 150.0 - 400.0 K/uL 297.0  320  321   NEUT# 1.7 - 7.7 K/uL   4.9   Lymph# 0.7 - 4.0 K/uL   2.3      There is no height or weight on file to calculate BMI.  Orders:  No orders of the defined types were placed in this encounter.  No orders of the defined types were placed in this encounter.    Procedures: Large Joint Inj: R knee on 10/07/2023 9:13 AM Indications: pain and diagnostic  evaluation Details: 22 G 1.5 in needle, anteromedial approach  Arthrogram: No  Medications: 5 mL lidocaine  (PF) 1 %; 40 mg methylPREDNISolone  acetate 40 MG/ML Outcome: tolerated well, no immediate complications Procedure, treatment alternatives, risks and benefits explained, specific risks discussed. Consent was given by the patient. Immediately prior to procedure a time out was called to verify the correct patient, procedure, equipment, support staff and site/side marked as required. Patient was prepped and draped in the usual sterile fashion.    Medium Joint Inj: R ankle on 10/07/2023 9:13 AM Indications: pain and diagnostic evaluation Details: 22 G 1.5 in needle, anteromedial approach Medications: 2 mL lidocaine  1 %; 40 mg methylPREDNISolone  acetate 40 MG/ML Outcome: tolerated well, no immediate complications Procedure, treatment alternatives, risks and benefits explained, specific risks discussed. Consent was given by the patient. Immediately prior to procedure a time out was called to verify the correct patient, procedure, equipment, support staff and site/side marked as required. Patient was prepped and draped in the usual sterile fashion.      Clinical Data: No additional findings.  ROS:  All other systems negative, except as noted in the HPI. Review of Systems  Objective: Vital Signs: LMP 08/24/2018 (Exact Date)   Specialty Comments:  No specialty comments available.  PMFS History: Patient Active Problem List   Diagnosis Date Noted   Candidal intertrigo 02/02/2023   Plantar fasciitis 02/02/2023   Chronic gastric ulcer without hemorrhage and without perforation 11/01/2022   Migraine 11/01/2022   Traumatic arthritis of right ankle 08/25/2022   Fibromyalgia 06/12/2022   Positive ANA (antinuclear antibody) 05/19/2022   Anxiety and depression 01/23/2022   Breast pain, left 12/08/2021   New onset of headaches after age 62 12/08/2021   Endometrial polyp 06/08/2021    Rib pain on left side 06/08/2021   History of gastric ulcer 06/08/2021   Urine frequency 06/08/2021   Post-traumatic osteoarthritis of right ankle 04/08/2021   Encounter for general adult medical examination with abnormal findings 12/15/2020   History of COVID-19 12/15/2020   Trochanteric bursitis, left hip 10/08/2020   Proteinuria 01/19/2020   Arthritis of right knee 08/21/2019   Nocturia 08/05/2019   History of abnormal cervical Pap smear 07/09/2018   Polyp of sigmoid colon    Hypokalemia 02/05/2018   Thiamine  deficiency 01/17/2018   Arthralgia 09/18/2017   Tick bite 09/18/2017   Vitamin D  deficiency 09/18/2017   Vision loss 09/18/2017   Constipation 04/28/2016   Right ankle pain 04/28/2016   Abnormal uterine bleeding 02/07/2016   Iron  deficiency 02/04/2016   Special screening for malignant neoplasms, colon 10/14/2015   Skin nodule 09/20/2015  Status post left partial knee replacement, medial aspect 07/09/2015   Obesity (BMI 30.0-34.9) 07/09/2015   Iron  deficiency anemia 01/14/2015   Hypothyroidism 10/01/2014   Cramps, extremity 10/01/2014   Allergic rhinitis 10/01/2014   S/P gastric bypass 10/01/2014   Past Medical History:  Diagnosis Date   Arthritis    OA   Complication of anesthesia    ITCHING TO FACE, BENADRYL  HELP BUT DROPS BLOOD PRESSURE   DVT (deep venous thrombosis) (HCC)    following first knee surgery in 2011   Gastric ulcer    Hashimoto's disease 09/18/2017   Headache    Heterozygous factor V Leiden mutation    Hives    chronic   Hypothyroid    Iron  deficiency anemia    Migraine     Family History  Problem Relation Age of Onset   Stroke Mother    Heart attack Mother    AAA (abdominal aortic aneurysm) Mother    Kidney cancer Mother    Atrial fibrillation Mother    Diabetes Father    Dementia Father    Breast cancer Sister 67   Leukemia Sister    Breast cancer Maternal Grandmother        late 50's   Breast cancer Maternal Aunt 41        BRACA + dx twice age 106 and 64   Arthritis Other        parent   Hyperlipidemia Other        parent   Stroke Other        parent   Hypertension Other        parent   Kidney disease Other        parent, grandparent   Diabetes Other        parent, grandparent   Renal cancer Other        parent    Past Surgical History:  Procedure Laterality Date   ANKLE ARTHROSCOPY Right 08/25/2022   Procedure: RIGHT ANKLE ARTHROSCOPY;  Surgeon: Harden Jerona GAILS, MD;  [facility];  Service: Orthopedics;  Laterality: Right;   ANKLE FRACTURE SURGERY     1994, 1996   CHOLECYSTECTOMY     1998   COLONOSCOPY WITH PROPOFOL  N/A 06/14/2018   Procedure: COLONOSCOPY WITH BIOPSY;  Surgeon: Jinny Carmine, MD;  Location: [facility];  Service: Endoscopy;  Laterality: N/A;   DILATION AND CURETTAGE OF UTERUS     1994, 2001   ESOPHAGOGASTRODUODENOSCOPY (EGD) WITH PROPOFOL  N/A 06/14/2018   Procedure: ESOPHAGOGASTRODUODENOSCOPY (EGD) WITH PROPOFOL ;  Surgeon: Jinny Carmine, MD;  Location: [facility];  Service: Endoscopy;  Laterality: N/A;   FOREIGN BODY REMOVAL N/A 06/14/2018   Procedure: FOREIGN BODY REMOVAL;  Surgeon: Jinny Carmine, MD;  Location: [facility];  Service: Endoscopy;  Laterality: N/A;  Staple Removal from Stomach   GASTRIC BYPASS     2012, revision 2013   KNEE ARTHROSCOPY     2011, 2013, 2015   KNEE SURGERY Right 09/21/2021   POLYPECTOMY N/A 06/14/2018   Procedure: POLYPECTOMY;  Surgeon: Jinny Carmine, MD;  Location: [facility];  Service: Endoscopy;  Laterality: N/A;   TOOTH EXTRACTION     2010   TOTAL KNEE ARTHROPLASTY Left 05/25/2015   Procedure: LEFT TOTAL KNEE ARTHROPLASTY;  Surgeon: Evalene JONETTA Chancy, MD;  Location: [facility] OR;  Service: Orthopedics;  Laterality: Left;   TUBAL LIGATION     2004   Social History   Occupational History   Not on file  Tobacco  Use   Smoking status: Never    Passive exposure: Current   Smokeless tobacco: Never  Vaping Use    Vaping status: Never Used  Substance and Sexual Activity   Alcohol use: No    Alcohol/week: 0.0 standard drinks of alcohol   Drug use: No   Sexual activity: Not on file

## 2023-10-12 ENCOUNTER — Ambulatory Visit: Admitting: Neurology

## 2023-10-26 ENCOUNTER — Other Ambulatory Visit (HOSPITAL_BASED_OUTPATIENT_CLINIC_OR_DEPARTMENT_OTHER): Payer: Self-pay

## 2023-10-29 ENCOUNTER — Encounter: Payer: Self-pay | Admitting: Internal Medicine

## 2023-10-29 ENCOUNTER — Other Ambulatory Visit (HOSPITAL_BASED_OUTPATIENT_CLINIC_OR_DEPARTMENT_OTHER): Payer: Self-pay

## 2023-10-30 ENCOUNTER — Other Ambulatory Visit: Payer: Self-pay | Admitting: Neurology

## 2023-10-30 ENCOUNTER — Encounter: Payer: Self-pay | Admitting: Internal Medicine

## 2023-10-30 ENCOUNTER — Other Ambulatory Visit (HOSPITAL_BASED_OUTPATIENT_CLINIC_OR_DEPARTMENT_OTHER): Payer: Self-pay

## 2023-10-30 DIAGNOSIS — G43809 Other migraine, not intractable, without status migrainosus: Secondary | ICD-10-CM

## 2023-10-30 DIAGNOSIS — G43009 Migraine without aura, not intractable, without status migrainosus: Secondary | ICD-10-CM

## 2023-10-31 ENCOUNTER — Other Ambulatory Visit (HOSPITAL_BASED_OUTPATIENT_CLINIC_OR_DEPARTMENT_OTHER): Payer: Self-pay

## 2023-10-31 MED ORDER — ONDANSETRON HCL 4 MG PO TABS
4.0000 mg | ORAL_TABLET | Freq: Three times a day (TID) | ORAL | 1 refills | Status: AC | PRN
  Filled 2023-10-31 – 2023-11-13 (×3): qty 20, 7d supply, fill #0
  Filled 2023-11-27: qty 20, 7d supply, fill #1

## 2023-11-09 ENCOUNTER — Other Ambulatory Visit (HOSPITAL_BASED_OUTPATIENT_CLINIC_OR_DEPARTMENT_OTHER): Payer: Self-pay

## 2023-11-12 ENCOUNTER — Encounter: Payer: Self-pay | Admitting: Radiology

## 2023-11-13 ENCOUNTER — Emergency Department (HOSPITAL_BASED_OUTPATIENT_CLINIC_OR_DEPARTMENT_OTHER)
Admission: EM | Admit: 2023-11-13 | Discharge: 2023-11-13 | Disposition: A | Discharge status: Home / Self Care | Attending: Emergency Medicine | Admitting: Emergency Medicine

## 2023-11-13 ENCOUNTER — Other Ambulatory Visit (HOSPITAL_BASED_OUTPATIENT_CLINIC_OR_DEPARTMENT_OTHER): Payer: Self-pay

## 2023-11-13 ENCOUNTER — Emergency Department (HOSPITAL_BASED_OUTPATIENT_CLINIC_OR_DEPARTMENT_OTHER)

## 2023-11-13 ENCOUNTER — Other Ambulatory Visit: Payer: Self-pay

## 2023-11-13 ENCOUNTER — Encounter (HOSPITAL_BASED_OUTPATIENT_CLINIC_OR_DEPARTMENT_OTHER): Payer: Self-pay | Admitting: Emergency Medicine

## 2023-11-13 ENCOUNTER — Telehealth: Payer: Self-pay

## 2023-11-13 DIAGNOSIS — R072 Precordial pain: Secondary | ICD-10-CM | POA: Insufficient documentation

## 2023-11-13 DIAGNOSIS — Z79899 Other long term (current) drug therapy: Secondary | ICD-10-CM | POA: Diagnosis not present

## 2023-11-13 DIAGNOSIS — E039 Hypothyroidism, unspecified: Secondary | ICD-10-CM | POA: Diagnosis not present

## 2023-11-13 DIAGNOSIS — R079 Chest pain, unspecified: Secondary | ICD-10-CM | POA: Diagnosis present

## 2023-11-13 LAB — CBC
HCT: 35.7 % — ABNORMAL LOW (ref 36.0–46.0)
Hemoglobin: 11.8 g/dL — ABNORMAL LOW (ref 12.0–15.0)
MCH: 32.3 pg (ref 26.0–34.0)
MCHC: 33.1 g/dL (ref 30.0–36.0)
MCV: 97.8 fL (ref 80.0–100.0)
Platelets: 328 K/uL (ref 150–400)
RBC: 3.65 MIL/uL — ABNORMAL LOW (ref 3.87–5.11)
RDW: 13.2 % (ref 11.5–15.5)
WBC: 6.6 K/uL (ref 4.0–10.5)
nRBC: 0 % (ref 0.0–0.2)

## 2023-11-13 LAB — BASIC METABOLIC PANEL WITH GFR
Anion gap: 11 (ref 5–15)
BUN: 16 mg/dL (ref 6–20)
CO2: 23 mmol/L (ref 22–32)
Calcium: 9.1 mg/dL (ref 8.9–10.3)
Chloride: 108 mmol/L (ref 98–111)
Creatinine, Ser: 0.72 mg/dL (ref 0.44–1.00)
GFR, Estimated: >60 mL/min (ref >60)
Glucose, Bld: 77 mg/dL (ref 70–99)
Potassium: 4.4 mmol/L (ref 3.5–5.1)
Sodium: 142 mmol/L (ref 135–145)

## 2023-11-13 LAB — TROPONIN T, HIGH SENSITIVITY: Troponin T High Sensitivity: <15 ng/L (ref 0–19)

## 2023-11-13 MED ORDER — METHOCARBAMOL 500 MG PO TABS
500.0000 mg | ORAL_TABLET | Freq: Two times a day (BID) | ORAL | 0 refills | Status: AC
  Filled 2023-11-13: qty 20, 10d supply, fill #0

## 2023-11-13 NOTE — Telephone Encounter (Signed)
 Patient scheduled an appointment to see Leron Glance, FNP-C, on 01/22/2024 via MyChart with the following comment:  Left side chest pain under breast  I called patient to see if she would be willing to speak with a triage nurse.  Patient states she is at [facility] right now and is being treated.  Patient states she would like to keep her appointment with Leron on 01/22/2024.

## 2023-11-13 NOTE — Discharge Instructions (Signed)
 You were seen in the emergency department today for chest pain.  As we discussed your lab work, EKG, chest x-ray all looked reassuring today.  I think that your symptoms could likely been related to a muscle strain.  I have given you a prescription for Robaxin  which is a muscle relaxer you can take as prescribed as needed for management of the symptoms.  Do not drive or operate machinery while taking this medication as it can make you drowsy.  However, as we discussed, you do have increased risk factors for blood clots.  We did opt to not further evaluate this today which is reasonable.  However, I recommend that if your symptoms do not improve or get worse that you return for further evaluation of this.  Please call your PCP to schedule close follow-up appointment as well.  I recommend monitoring your stress levels.  Continue to monitor how you are doing overall, and return to the emergency department for any new or worsening symptoms such as: Worsening pain or pain with exertion, difficulty breathing, sweating, or pain or swelling in your legs.

## 2023-11-13 NOTE — ED Provider Notes (Signed)
 [facility] Provider Note   CSN: [id number] Arrival date & time: 11/13/23  1253     Patient presents with: Chest Pain   Whitney Hill is a 53 y.o. female.   Patient with history of DVT, factor V Leiden not on anticoagulation, hypothyroidism presents today with complaints of chest pain.  She reports that the pain is located under her left breast and woke her from rest at 2:00 this morning when she rolled over in bed.  Reports that she thought this was likely muscular in nature, reports that she took a 65-year-old muscle relaxer that was partially dissolved from being dropped in water  for management of this but did not have significant improvement.  Reports she also took Pepcid  to see if it could be reflux pain and did not have significant improvement with this either.  Reports that the pain comes in waves and is sharp in nature and pinpoint under her left breast.  Pain occurs without discernible trigger.  No aggravating or relieving factors.  It is reproducible to palpation and is not pleuritic.  She denies any shortness of breath.  No leg pain or leg swelling, no recent travel or surgeries.  Does report that she had issues with left breast pain several years ago and saw several specialist and had numerous testing done without any clear etiology of her symptoms.  Does report this does feel somewhat different to her. Pain does radiate into her left arm, denies numbness/tingling, or weakness in her arm.   The history is provided by the patient. No language interpreter was used.  Chest Pain      Prior to Admission medications   Medication Sig Start Date End Date Taking? Authorizing Provider  cyanocobalamin  (VITAMIN B12) 1000 MCG/ML injection Inject 1 mL (1000 mcg total) into the muscle once weekly for 4 weeks and then once monthly 08/07/22   Maribeth Camellia MATSU, MD  cyclobenzaprine  (FLEXERIL ) 5 MG tablet Take 1 tablet (5 mg total) by mouth 3  (three) times daily as needed (muscle pain). 02/22/22   Maribeth Camellia MATSU, MD  diclofenac  sodium (VOLTAREN ) 1 % GEL Apply 2 g topically 4 (four) times daily as needed (pain.).    [provider]  EPINEPHrine  0.3 mg/0.3 mL IJ SOAJ injection Inject 0.3 mg into the muscle as needed for anaphylaxis. 01/23/22   Maribeth Camellia MATSU, MD  Folic Acid  5 MG CAPS Take 5 mg by mouth daily.    [provider]  levothyroxine  (SYNTHROID ) 150 MCG tablet TAKE 1 TABLET BY MOUTH EVERY DAY BEFORE BREAKFAST 12/25/22   Maribeth Camellia MATSU, MD  multivitamin-iron -minerals-folic acid  (CENTRUM) chewable tablet Chew 1 tablet by mouth daily.    [provider]  nortriptyline  (PAMELOR ) 10 MG capsule Take 3 capsules (30 mg total) by mouth at bedtime. 06/29/23   Leigh Venetia CROME, MD  nystatin  (MYCOSTATIN /NYSTOP ) powder Apply 1 Application topically 3 (three) times daily as needed (candidal intertrigo). 04/25/23   Hope Merle, MD  ondansetron  (ZOFRAN ) 4 MG tablet Take 1 tablet (4 mg total) by mouth every 8 (eight) hours as needed for nausea or vomiting. 10/31/23   Leigh Venetia CROME, MD  pantoprazole  (PROTONIX ) 40 MG tablet Take 1 tablet (40 mg total) by mouth daily. 03/14/23   Marylynn Verneita CROME, MD  predniSONE  (DELTASONE ) 10 MG tablet Take 1 tablet (10 mg total) by mouth daily with breakfast. Patient not taking: Reported on 06/29/2023 05/21/23   Harden Jerona GAILS, MD  SUMAtriptan  (IMITREX )  100 MG tablet Take as directed.  Maximum of 2 tablets (200 mg) in 24 hours. 09/20/23   Gretel App, NP  Syringe/Needle, Disp, (SYRINGE 3CC/25GX1) 25G X 1 3 ML MISC Use with b12 injections 08/07/22   Maribeth Camellia MATSU, MD  tirzepatide  (ZEPBOUND ) 12.5 MG/0.5ML Pen Inject 12.5 mg into the skin once a week. Patient taking differently: Inject 10 mg into the skin once a week. 05/10/23   Gretel App, NP  Vitamin D , Ergocalciferol , (DRISDOL ) 1.25 MG (50000 UNIT) CAPS capsule Take 1 capsule (50,000 Units total) by mouth every 7 (seven) days.  08/04/22   Maribeth Camellia MATSU, MD    Allergies: Paxil [paroxetine hcl] and Penicillins    Review of Systems  Cardiovascular:  Positive for chest pain.  All other systems reviewed and are negative.   Updated Vital Signs BP 109/78 (BP Location: Right Arm)   Pulse 81   Temp 98.3 F (36.8 C) (Oral)   Resp 16   Ht 5' 5 (1.651 m)   Wt 76.2 kg   LMP 08/24/2018 (Exact Date)   SpO2 98%   BMI 27.96 kg/m   Physical Exam Vitals and nursing note reviewed.  Constitutional:      General: She is not in acute distress.    Appearance: Normal appearance. She is normal weight. She is not ill-appearing, toxic-appearing or diaphoretic.  HENT:     Head: Normocephalic and atraumatic.  Cardiovascular:     Rate and Rhythm: Normal rate and regular rhythm.     Pulses:          Dorsalis pedis pulses are 2+ on the right side and 2+ on the left side.       Posterior tibial pulses are 2+ on the right side and 2+ on the left side.     Heart sounds: Normal heart sounds.  Pulmonary:     Effort: Pulmonary effort is normal. No respiratory distress.     Breath sounds: Normal breath sounds.  Chest:     Comments: Point tenderness noted under the left breast, no crepitus, deformity, or overlying skin changes. Musculoskeletal:        General: Normal range of motion.     Cervical back: Normal range of motion.     Right lower leg: No tenderness. No edema.     Left lower leg: No tenderness. No edema.  Skin:    General: Skin is warm and dry.  Neurological:     General: No focal deficit present.     Mental Status: She is alert.  Psychiatric:        Mood and Affect: Mood normal.        Behavior: Behavior normal.     (all labs ordered are listed, but only abnormal results are displayed) Labs Reviewed  CBC - Abnormal; Notable for the following components:      Result Value   RBC 3.65 (*)    Hemoglobin 11.8 (*)    HCT 35.7 (*)    All other components within normal limits  BASIC METABOLIC PANEL WITH GFR   TROPONIN T, HIGH SENSITIVITY    EKG: EKG Interpretation Date/Time:  Tuesday November 13 2023 13:00:56 EST Ventricular Rate:  86 PR Interval:  157 QRS Duration:  80 QT Interval:  362 QTC Calculation: 433 R Axis:   0  Text Interpretation: Sinus rhythm Low voltage, precordial leads Abnormal R-wave progression, early transition Borderline T abnormalities, anterior leads Baseline wander in lead(s) V3 Confirmed by Darra Chew [phone]) on 11/13/2023 1:02:47  PM  Radiology: DG Chest 2 View Result Date: 11/13/2023 EXAM: 2 VIEW(S) XRAY OF THE CHEST 11/13/2023 01:26:02 PM COMPARISON: None available. CLINICAL HISTORY: chest pain FINDINGS: LUNGS AND PLEURA: No focal pulmonary opacity. No pulmonary edema. No pleural effusion. No pneumothorax. HEART AND MEDIASTINUM: No acute abnormality of the cardiac and mediastinal silhouettes. BONES AND SOFT TISSUES: No acute osseous abnormality. IMPRESSION: 1. No acute cardiopulmonary process. Electronically signed by: Norman Gatlin MD 11/13/2023 02:00 PM EST RP Workstation: [id number]     Procedures   Medications Ordered in the ED - No data to display                                  Medical Decision Making Amount and/or Complexity of Data Reviewed Labs: ordered. Radiology: ordered.   This patient is a 53 y.o. female who presents to the ED for concern of chest pain, this involves an extensive number of treatment options, and is a complaint that carries with it a high risk of complications and morbidity. The emergent differential diagnosis prior to evaluation includes, but is not limited to,  ACS, pericarditis, myocarditis, aortic dissection, PE, pneumothorax, esophageal rupture, pneumonia, reflux/PUD, biliary disease, pancreatitis, costochondritis, anxiety  This is not an exhaustive differential.   Past Medical History / Co-morbidities / Social History:  has a past medical history of Arthritis, Complication of anesthesia, DVT (deep venous thrombosis)  (HCC), Gastric ulcer, Hashimoto's disease (09/18/2017), Headache, Heterozygous factor V Leiden mutation, Hives, Hypothyroid, Iron  deficiency anemia, and Migraine.  Additional history: Chart reviewed. Pertinent results include: unable to find notes documenting details of patients DVT, patient does report that she was told that she could come off anticoagulation, she did not self discontinue. Does look like she saw surgery for left breast pain a few years ago  Physical Exam: Physical exam performed. The pertinent findings include:   Point tenderness noted under the left breast, no crepitus, deformity, or overlying skin changes.  No leg pain or leg swelling.  Lab Tests: I ordered, and personally interpreted labs.  The pertinent results include:  hgb 11.8, no other acute laboratory abnormalities   Imaging Studies: I ordered imaging studies including CXR. I independently visualized and interpreted imaging which showed NAD. I agree with the radiologist interpretation.   Cardiac Monitoring:  The patient was maintained on a cardiac monitor.  My attending physician viewed and interpreted the cardiac monitored which showed an underlying rhythm of: sinus rhythm, no STEMI. I agree with this interpretation.   Disposition: After consideration of the diagnostic results and the patients response to treatment, I feel that emergency department workup does not suggest an emergent condition requiring admission or immediate intervention beyond what has been performed at this time. The plan is: discharge with close outpatient follow-up and return precautions. Patients work-up is benign.  Did discuss with her that with her history of DVT and factor V Leiden that she was at risk for additional blood clots and did offer d dimer/CTA for evaluation. However, patient denies any shortness of breath, leg pain, or leg swelling. She does not feel like this is a blood clot. She reports that she really just wants muscle  relaxers as this has helped her with pain in the past. She is not tachycardic. She wants to go home. Evaluation and diagnostic testing in the emergency department does not suggest an emergent condition requiring admission or immediate intervention beyond what has been performed at this time.  Plan for discharge with close PCP follow-up.  Patient is understanding and amenable with plan, educated on red flag symptoms that would prompt immediate return.  Patient discharged in stable condition.  Final diagnoses:  Precordial chest pain    ED Discharge Orders          Ordered    methocarbamol  (ROBAXIN ) 500 MG tablet  2 times daily        11/13/23 1619          An After Visit Summary was printed and given to the patient.      Nora Lauraine DELENA DEVONNA 11/13/23 1620    Doretha Folks, MD 11/16/23 [phone]

## 2023-11-13 NOTE — ED Triage Notes (Signed)
 Pt c/o  L chest pain early this AM that woke her up appx 0200, radiating to L axilla.   Took muscle relaxer, no relief. Pain is coming in waves  reports hx of L breast/nipple pain, this feels different.

## 2023-11-20 ENCOUNTER — Ambulatory Visit: Admitting: Nurse Practitioner

## 2023-11-22 ENCOUNTER — Telehealth: Payer: Self-pay | Admitting: Nurse Practitioner

## 2023-11-22 ENCOUNTER — Telehealth: Payer: Self-pay

## 2023-11-22 NOTE — Telephone Encounter (Signed)
 Left MyChart message to let pt know appt has been changed from 12/11 to 12/2, she wanted to be seen sooner.   E2C2, please reschedule pt if she decides this appt will not work -kh

## 2023-11-22 NOTE — Telephone Encounter (Signed)
 Patient rescheduled appointment with Leron Glance, FNP-C, from January to December when an appointment time became available.  Patient included the following comment with the appointment:  Left side chest pain under breast  I called patient.  Patient declined offer to speak with triage nurse.  Patient states she has already been to the ED for this issue.  Patient states they did bloodwork at the hospital, and she would like for Leron Glance, FNP-C, to see the results, as her red blood cell count was abnormal. Patient states she has been anemic before, so she wanted to see what Leron has to say about it.

## 2023-12-11 ENCOUNTER — Ambulatory Visit: Admitting: Nurse Practitioner

## 2023-12-17 ENCOUNTER — Other Ambulatory Visit (HOSPITAL_BASED_OUTPATIENT_CLINIC_OR_DEPARTMENT_OTHER): Payer: Self-pay

## 2023-12-20 ENCOUNTER — Ambulatory Visit: Admitting: Orthopedic Surgery

## 2023-12-20 ENCOUNTER — Ambulatory Visit: Admitting: Nurse Practitioner

## 2023-12-27 ENCOUNTER — Ambulatory Visit: Admitting: Orthopedic Surgery

## 2023-12-28 ENCOUNTER — Other Ambulatory Visit (HOSPITAL_BASED_OUTPATIENT_CLINIC_OR_DEPARTMENT_OTHER): Payer: Self-pay

## 2023-12-31 ENCOUNTER — Encounter: Payer: Self-pay | Admitting: Internal Medicine

## 2023-12-31 ENCOUNTER — Other Ambulatory Visit (HOSPITAL_BASED_OUTPATIENT_CLINIC_OR_DEPARTMENT_OTHER): Payer: Self-pay

## 2024-01-07 ENCOUNTER — Ambulatory Visit: Admitting: Orthopedic Surgery

## 2024-01-07 NOTE — Progress Notes (Unsigned)
 "  NEUROLOGY FOLLOW UP OFFICE NOTE  Whitney Hill [id number]  Subjective:  Whitney Hill is a 53 y.o. year old right-handed female with a medical history of hypothyroidism, migraines, iron  deficiency anemia, OA, gastric bypass surgery, thiamine  deficiency, B12 deficiency, vit D deficiency, folate deficiency who we last saw on 06/29/23 for migraines.  To briefly review: 02/14/23: Patient has a chronic headache for many years. It became more severe about 2 years ago. She would mainly have one when waking up. It is usually right sided around the eye and back of the head, but can be on the left side. It was occurred every day and would last all day. She took tylenol , but this did nothing. Her daughter has bad migraines and gave her a Imitrex  100 mg and this got rid of the headache. She endorses photophobia, phonophobia, and nausea, but no vomiting. She denies any recent vision changes or vision loss.   She would get up and move around and sometimes headache would improve. She can now get a headache at any time. She does not think laying down makes her headache worse. She does not think she snores. She feels well rested in the morning. She is not tired throughout the day.   She then spoke with her PCP. She was started on Imitrex  50 mg PRN about 2 years ago. This helped, but she was having to take all 9 every month. She does not usually take at headache They improved but increased in frequency about 3-4 months ago. She was started on Topamax  25 mg daily on 11/01/22 by PCP. Patient took it about 3 weeks but stopped because it made all food and drink taste horrible. Taste returned to normal after stopping it.    Her current headaches are 7/10 but were 10/10 about 2 years ago. She will have 2-3 headaches per week currently.   Current medications: -Imitrex  50 mg PRN - almost always helps, maybe not always completely resolved though -Zofran  PRN for nausea   Past medications:  -Topamax  - stopped  due to abnormal taste   Caffeine use: 2 sodas per day Smoker: Never EtOH use: None Restrictive diet: No Family history of neurologic disease: Daughter and son with migraines   Of note, patient has seen neurology in the past for weakness and vision changes and found to have B1 deficiency. She also gets B12 injections.   Of note, patient has had 12 ankle or leg surgeries due to fractures and OA.  I started Nortriptyline  20 mg at bedtime and Sumatriptan  100 mg PRN on 02/14/23.  06/29/23: Patient was doing well in terms of headaches until recently. She was having maybe one headache per week. She went to [place] for a month and did not have Nortriptyline  (mid 02/2023 to end of 03/2023). When she came back, she was having 1 headache a week, that she would wake up with. They would take Sumatriptan  that would resolve the headache. She was then in [place] and still doing okay.   In the last week, she has had more headaches. She came back from [place] and a week later headaches got worse. She has had a headache essentially everyday. Sumatriptan  did not work on one of the headaches until she took it twice.   She denies significant jet lag. She thinks increased humidity and rain the past week may be contributing.  Most recent Assessment and Plan (06/29/23): This is Whitney Hill, a 53 y.o. female with: Migraine without aura: was doing well  with only 1 headache per week until the past week when she has had daily headaches. This could be due to a lot of recent travel to different time zones/climates and those associated changes or due to heavy rain and humidity over the last week. Hopefully an increase in nortriptyline  will again reduce her headache frequency.  B12 deficiency Vit D deficiency Folate deficiency   Plan: Migraine prevention:  Increase nortriptyline  to 30 mg at bedtime Migraine rescue:  Continue sumatriptan  100 mg as needed at headache, can repeat in 2 hours if needed, zofran  ODT PRN for  nausea Limit use of pain relievers to no more than 2 days out of week to prevent risk of rebound or medication-overuse headache. Keep headache diary   -Continue B12, vit D, folic acid   Since their last visit: ***  MEDICATIONS:  Outpatient Encounter Medications as of 01/17/2024  Medication Sig Note   cyanocobalamin  (VITAMIN B12) 1000 MCG/ML injection Inject 1 mL (1000 mcg total) into the muscle once weekly for 4 weeks and then once monthly    cyclobenzaprine  (FLEXERIL ) 5 MG tablet Take 1 tablet (5 mg total) by mouth 3 (three) times daily as needed (muscle pain).    diclofenac  sodium (VOLTAREN ) 1 % GEL Apply 2 g topically 4 (four) times daily as needed (pain.).    EPINEPHrine  0.3 mg/0.3 mL IJ SOAJ injection Inject 0.3 mg into the muscle as needed for anaphylaxis. 08/25/2022: Has never used   Folic Acid  5 MG CAPS Take 5 mg by mouth daily. 08/18/2022: New supplement patient will start post operatively   levothyroxine  (SYNTHROID ) 150 MCG tablet TAKE 1 TABLET BY MOUTH EVERY DAY BEFORE BREAKFAST    methocarbamol  (ROBAXIN ) 500 MG tablet Take 1 tablet (500 mg total) by mouth 2 (two) times daily.    multivitamin-iron -minerals-folic acid  (CENTRUM) chewable tablet Chew 1 tablet by mouth daily.    nortriptyline  (PAMELOR ) 10 MG capsule Take 3 capsules (30 mg total) by mouth at bedtime.    nystatin  (MYCOSTATIN /NYSTOP ) powder Apply 1 Application topically 3 (three) times daily as needed (candidal intertrigo).    ondansetron  (ZOFRAN ) 4 MG tablet Take 1 tablet (4 mg total) by mouth every 8 (eight) hours as needed for nausea or vomiting.    pantoprazole  (PROTONIX ) 40 MG tablet Take 1 tablet (40 mg total) by mouth daily.    predniSONE  (DELTASONE ) 10 MG tablet Take 1 tablet (10 mg total) by mouth daily with breakfast. (Patient not taking: Reported on 06/29/2023)    SUMAtriptan  (IMITREX ) 100 MG tablet Take as directed.  Maximum of 2 tablets (200 mg) in 24 hours.    Syringe/Needle, Disp, (SYRINGE 3CC/25GX1) 25G X 1 3  ML MISC Use with b12 injections    tirzepatide  (ZEPBOUND ) 12.5 MG/0.5ML Pen Inject 12.5 mg into the skin once a week. (Patient taking differently: Inject 10 mg into the skin once a week.)    Vitamin D , Ergocalciferol , (DRISDOL ) 1.25 MG (50000 UNIT) CAPS capsule Take 1 capsule (50,000 Units total) by mouth every 7 (seven) days.    No facility-administered encounter medications on file as of 01/17/2024.    PAST MEDICAL HISTORY: Past Medical History:  Diagnosis Date   Arthritis    OA   Complication of anesthesia    ITCHING TO FACE, BENADRYL  HELP BUT DROPS BLOOD PRESSURE   DVT (deep venous thrombosis) (HCC)    following first knee surgery in 2011   Gastric ulcer    Hashimoto's disease 09/18/2017   Headache    Heterozygous factor V Leiden mutation  Hives    chronic   Hypothyroid    Iron  deficiency anemia    Migraine     PAST SURGICAL HISTORY: Past Surgical History:  Procedure Laterality Date   ANKLE ARTHROSCOPY Right 08/25/2022   Procedure: RIGHT ANKLE ARTHROSCOPY;  Surgeon: Harden Jerona GAILS, MD;  Location: [facility] OR;  Service: Orthopedics;  Laterality: Right;   ANKLE FRACTURE SURGERY     1994, 1996   CHOLECYSTECTOMY     1998   COLONOSCOPY WITH PROPOFOL  N/A 06/14/2018   Procedure: COLONOSCOPY WITH BIOPSY;  Surgeon: Jinny Carmine, MD;  Location: [facility];  Service: Endoscopy;  Laterality: N/A;   DILATION AND CURETTAGE OF UTERUS     1994, 2001   ESOPHAGOGASTRODUODENOSCOPY (EGD) WITH PROPOFOL  N/A 06/14/2018   Procedure: ESOPHAGOGASTRODUODENOSCOPY (EGD) WITH PROPOFOL ;  Surgeon: Jinny Carmine, MD;  Location: [facility];  Service: Endoscopy;  Laterality: N/A;   FOREIGN BODY REMOVAL N/A 06/14/2018   Procedure: FOREIGN BODY REMOVAL;  Surgeon: Jinny Carmine, MD;  Location: [facility];  Service: Endoscopy;  Laterality: N/A;  Staple Removal from Stomach   GASTRIC BYPASS     2012, revision 2013   KNEE ARTHROSCOPY     2011, 2013, 2015   KNEE SURGERY Right 09/21/2021    POLYPECTOMY N/A 06/14/2018   Procedure: POLYPECTOMY;  Surgeon: Jinny Carmine, MD;  Location: [facility];  Service: Endoscopy;  Laterality: N/A;   TOOTH EXTRACTION     2010   TOTAL KNEE ARTHROPLASTY Left 05/25/2015   Procedure: LEFT TOTAL KNEE ARTHROPLASTY;  Surgeon: Evalene JONETTA Chancy, MD;  Location: [facility] OR;  Service: Orthopedics;  Laterality: Left;   TUBAL LIGATION     2004    ALLERGIES: Allergies[1]  FAMILY HISTORY: Family History  Problem Relation Age of Onset   Stroke Mother    Heart attack Mother    AAA (abdominal aortic aneurysm) Mother    Kidney cancer Mother    Atrial fibrillation Mother    Diabetes Father    Dementia Father    Breast cancer Sister 72   Leukemia Sister    Breast cancer Maternal Grandmother        late 50's   Breast cancer Maternal Aunt 89       BRACA + dx twice age 50 and 76   Arthritis Other        parent   Hyperlipidemia Other        parent   Stroke Other        parent   Hypertension Other        parent   Kidney disease Other        parent, grandparent   Diabetes Other        parent, grandparent   Renal cancer Other        parent    SOCIAL HISTORY: Social History[2] Social History   Social History Narrative   Are you right handed or left handed? right   Are you currently employed ? no   What is your current occupation?   Do you live at home alone?   Who lives with you? family   What type of home do you live in: 1 story or 2 story? two   Caffiene 2 soda a day       Objective:  Vital Signs:  LMP 08/24/2018   ***  Labs and Imaging review: New results: 11/13/23: CBC significant for Hb 11.8, MCV 97.8 BMP unremarkable  EKG (11/13/23): NSR, normal QT  Previously reviewed results:  TSH (02/02/23): wnl   11/01/22: Zinc  wnl HbA1c: 5.5   07/31/22: CMET unremarkable Lipid panel: tChol 179, LDL 96, TG 170 Vit D low at 14.52 TSH wnl B1 wnl B12: 300 CBC significant for MCV of 98.2 Ferritin 67.1 Vit A wnl Vit E  wnl Folate low at 2.1 Copper  wnl   05/19/22: CRP wnl ESR wnl dsDNA neg SSA, SSB neg Anti-SM ab neg RNP ab neg   12/08/21: CCP neg RF neg CK: 60   Imaging: MRI brain wo contrast (12/16/21): FINDINGS: Brain: No restricted diffusion to suggest acute or subacute infarct.No acute hemorrhage, mass, mass effect, or midline shift. No hydrocephalus or extra-axial collection.No hemosiderin deposition to suggest remote hemorrhage.   Vascular: Patent arterial flow voids.   Skull and upper cervical spine: Normal marrow signal.   Sinuses/Orbits: Minimal mucosal thickening in the right maxillary sinus. Otherwise clear paranasal sinuses. The orbits are unremarkable.   Other: The mastoid air cells are well aerated.   IMPRESSION: No acute intracranial process. No etiology is seen for the patient's headaches.   MRI cervical spine wo contrast (01/18/18): FINDINGS: Alignment: Straightening of the normal cervical lordosis. No listhesis or malalignment.   Vertebrae: Vertebral body heights well maintained without evidence for acute or chronic fracture. Bone marrow signal intensity within normal limits. No discrete or worrisome osseous lesions. No abnormal marrow edema.   Cord: Signal intensity within the cervical spinal cord is normal. No cord signal abnormality to suggest demyelinating disease. Normal cord caliber and morphology.   Posterior Fossa, vertebral arteries, paraspinal tissues: Visualized brain within normal limits. Craniocervical junction normal. Paraspinous and prevertebral soft tissues within normal limits. Normal intravascular flow voids seen within the vertebral arteries bilaterally.   Disc levels:   C2-C3: Unremarkable.   C3-C4:  Unremarkable.   C4-C5:  Unremarkable.   C5-C6:  Unremarkable.   C6-C7: Mild annular disc bulge. No significant canal or foraminal stenosis.   C7-T1:  Unremarkable.   Visualized upper thoracic spine is unremarkable.    IMPRESSION: 1. Normal MRI appearance of the cervical spinal cord. No findings to suggest demyelinating disease or other abnormality. 2. Minor noncompressive disc bulging at C6-7 without stenosis or impingement. Otherwise unremarkable MRI of the cervical spine.   EMG of LUE (10/04/2018 by Dr. Lane): Impression: Normal study. There is no electrodiagnostic evidence of a large fiber neuropathy or cervical radiculopathy in the left upper extremity.   Assessment/Plan:  This is Whitney Hill, a 53 y.o. female with: ***   Plan: ***  Return to clinic in ***  Total time spent reviewing records, interview, history/exam, documentation, and coordination of care on day of encounter:  *** min  Venetia Potters, MD    [1]  Allergies Allergen Reactions   Paxil [Paroxetine Hcl] Anaphylaxis    Throat swelled shut   Penicillins Hives and Itching    Had cefazolin  2017, 2024  [2]  Social History Tobacco Use   Smoking status: Never    Passive exposure: Current   Smokeless tobacco: Never  Vaping Use   Vaping status: Never Used  Substance Use Topics   Alcohol use: No    Alcohol/week: 0.0 standard drinks of alcohol   Drug use: No   "

## 2024-01-09 ENCOUNTER — Other Ambulatory Visit: Payer: Self-pay

## 2024-01-09 ENCOUNTER — Other Ambulatory Visit (HOSPITAL_BASED_OUTPATIENT_CLINIC_OR_DEPARTMENT_OTHER): Payer: Self-pay

## 2024-01-09 ENCOUNTER — Telehealth: Admitting: Physician Assistant

## 2024-01-09 ENCOUNTER — Encounter: Payer: Self-pay | Admitting: Internal Medicine

## 2024-01-09 DIAGNOSIS — H1031 Unspecified acute conjunctivitis, right eye: Secondary | ICD-10-CM

## 2024-01-09 MED ORDER — POLYMYXIN B-TRIMETHOPRIM 10000-0.1 UNIT/ML-% OP SOLN
1.0000 [drp] | Freq: Four times a day (QID) | OPHTHALMIC | 0 refills | Status: AC
  Filled 2024-01-09: qty 10, 25d supply, fill #0

## 2024-01-09 NOTE — Progress Notes (Signed)
 We are sorry that you are not feeling well.  Here is how we plan to help!  Based on what you have shared with me it looks like you have conjunctivitis.  Conjunctivitis is a common inflammatory or infectious condition of the eye that is often referred to as pink eye.  In most cases it is contagious (viral or bacterial). However, not all conjunctivitis requires antibiotics (ex. Allergic).  We have made appropriate suggestions for you based upon your presentation.  I have prescribed Polytrim  Ophthalmic drops 1-2 drops 4 times a day times 5 days  Pink eye can be highly contagious.  It is typically spread through direct contact with secretions, or contaminated objects or surfaces that one may have touched.  Strict handwashing is suggested with soap and water is urged.  If not available, use alcohol based had sanitizer.  Avoid unnecessary touching of the eye.  If you wear contact lenses, you will need to refrain from wearing them until you see no white discharge from the eye for at least 24 hours after being on medication.  You should see symptom improvement in 1-2 days after starting the medication regimen.  Call us  if symptoms are not improved in 1-2 days.  Home Care: Wash your hands often! Do not wear your contacts until you complete your treatment plan. Avoid sharing towels, bed linen, personal items with a person who has pink eye. See attention for anyone in your home with similar symptoms.  Get Help Right Away If: Your symptoms do not improve. You develop blurred or loss of vision. Your symptoms worsen (increased discharge, pain or redness)  Your e-visit answers were reviewed by a board certified advanced clinical practitioner to complete your personal care plan.  Depending on the condition, your plan could have included both over the counter or prescription medications.  If there is a problem please reply  once you have received a response from your provider.  Your safety is important to  us .  If you have drug allergies check your prescription carefully.    You can use MyChart to ask questions about today's visit, request a non-urgent call back, or ask for a work or school excuse for 24 hours related to this e-Visit. If it has been greater than 24 hours you will need to follow up with your provider, or enter a new e-Visit to address those concerns.   You will get an e-mail in the next two days asking about your experience.  I hope that your e-visit has been valuable and will speed your recovery. Thank you for using e-visits.  I have spent 5 minutes in review of e-visit questionnaire, review and updating patient chart, medical decision making and response to patient.   Elsie Velma Lunger, PA-C

## 2024-01-11 ENCOUNTER — Encounter: Payer: Self-pay | Admitting: Internal Medicine

## 2024-01-11 ENCOUNTER — Other Ambulatory Visit (HOSPITAL_BASED_OUTPATIENT_CLINIC_OR_DEPARTMENT_OTHER): Payer: Self-pay

## 2024-01-17 ENCOUNTER — Ambulatory Visit: Admitting: Neurology

## 2024-01-17 ENCOUNTER — Encounter: Payer: Self-pay | Admitting: Neurology

## 2024-01-17 ENCOUNTER — Ambulatory Visit: Admitting: Orthopedic Surgery

## 2024-01-22 ENCOUNTER — Ambulatory Visit: Admitting: Nurse Practitioner

## 2024-01-23 ENCOUNTER — Other Ambulatory Visit (HOSPITAL_BASED_OUTPATIENT_CLINIC_OR_DEPARTMENT_OTHER): Payer: Self-pay

## 2024-01-30 ENCOUNTER — Encounter: Payer: Self-pay | Admitting: Internal Medicine

## 2024-02-06 ENCOUNTER — Ambulatory Visit: Admitting: Nurse Practitioner
